# Patient Record
Sex: Female | Born: 1945 | Race: Black or African American | Hispanic: No | Marital: Married | Smoking: Never smoker
Health system: Southern US, Community
[De-identification: ages and names within clinical notes are randomized; demographics above are authoritative.]

## PROBLEM LIST (undated history)

## (undated) DIAGNOSIS — I471 Supraventricular tachycardia, unspecified: Secondary | ICD-10-CM

## (undated) DIAGNOSIS — N184 Chronic kidney disease, stage 4 (severe): Secondary | ICD-10-CM

## (undated) DIAGNOSIS — N183 Chronic kidney disease, stage 3 unspecified: Secondary | ICD-10-CM

## (undated) DIAGNOSIS — R001 Bradycardia, unspecified: Secondary | ICD-10-CM

## (undated) DIAGNOSIS — I071 Rheumatic tricuspid insufficiency: Secondary | ICD-10-CM

## (undated) DIAGNOSIS — I10 Essential (primary) hypertension: Secondary | ICD-10-CM

## (undated) DIAGNOSIS — I160 Hypertensive urgency: Secondary | ICD-10-CM

## (undated) DIAGNOSIS — I480 Paroxysmal atrial fibrillation: Secondary | ICD-10-CM

## (undated) DIAGNOSIS — I493 Ventricular premature depolarization: Secondary | ICD-10-CM

## (undated) DIAGNOSIS — D6852 Prothrombin gene mutation: Secondary | ICD-10-CM

## (undated) DIAGNOSIS — T753XXA Motion sickness, initial encounter: Secondary | ICD-10-CM

## (undated) DIAGNOSIS — I272 Pulmonary hypertension, unspecified: Secondary | ICD-10-CM

## (undated) DIAGNOSIS — D6851 Activated protein C resistance: Secondary | ICD-10-CM

## (undated) DIAGNOSIS — I639 Cerebral infarction, unspecified: Secondary | ICD-10-CM

## (undated) DIAGNOSIS — E785 Hyperlipidemia, unspecified: Secondary | ICD-10-CM

## (undated) HISTORY — DX: Cerebral infarction, unspecified: I63.9

## (undated) HISTORY — DX: Prothrombin gene mutation: D68.52

## (undated) HISTORY — DX: Paroxysmal atrial fibrillation: I48.0

## (undated) HISTORY — DX: Ventricular premature depolarization: I49.3

## (undated) HISTORY — DX: Chronic kidney disease, stage 4 (severe): N18.4

## (undated) HISTORY — DX: Chronic kidney disease, stage 3 unspecified: N18.30

## (undated) HISTORY — DX: Hypertensive urgency: I16.0

## (undated) HISTORY — DX: Bradycardia, unspecified: R00.1

## (undated) HISTORY — DX: Rheumatic tricuspid insufficiency: I07.1

## (undated) HISTORY — DX: Activated protein C resistance: D68.51

## (undated) HISTORY — DX: Pulmonary hypertension, unspecified: I27.20

## (undated) HISTORY — DX: Chronic kidney disease, stage 3 (moderate): N18.3

---

## 1975-04-29 HISTORY — PX: TUBAL LIGATION: SHX77

## 1998-08-21 ENCOUNTER — Other Ambulatory Visit: Admission: RE | Admit: 1998-08-21 | Discharge: 1998-08-21 | Payer: Self-pay | Admitting: *Deleted

## 1998-09-04 ENCOUNTER — Encounter: Payer: Self-pay | Admitting: Obstetrics

## 1998-09-04 ENCOUNTER — Ambulatory Visit (HOSPITAL_COMMUNITY): Admission: RE | Admit: 1998-09-04 | Discharge: 1998-09-04 | Payer: Self-pay | Admitting: Obstetrics

## 2001-12-16 ENCOUNTER — Emergency Department (HOSPITAL_COMMUNITY): Admission: EM | Admit: 2001-12-16 | Discharge: 2001-12-16 | Payer: Self-pay | Admitting: Emergency Medicine

## 2002-10-01 ENCOUNTER — Encounter: Payer: Self-pay | Admitting: Emergency Medicine

## 2002-10-01 ENCOUNTER — Emergency Department (HOSPITAL_COMMUNITY): Admission: EM | Admit: 2002-10-01 | Discharge: 2002-10-01 | Payer: Self-pay | Admitting: Emergency Medicine

## 2004-11-06 ENCOUNTER — Ambulatory Visit (HOSPITAL_COMMUNITY): Admission: RE | Admit: 2004-11-06 | Discharge: 2004-11-06 | Payer: Self-pay | Admitting: Obstetrics & Gynecology

## 2004-11-23 ENCOUNTER — Inpatient Hospital Stay (HOSPITAL_COMMUNITY): Admission: EM | Admit: 2004-11-23 | Discharge: 2004-11-26 | Payer: Self-pay | Admitting: *Deleted

## 2007-04-08 ENCOUNTER — Ambulatory Visit (HOSPITAL_COMMUNITY): Admission: RE | Admit: 2007-04-08 | Discharge: 2007-04-08 | Payer: Self-pay | Admitting: Obstetrics & Gynecology

## 2007-08-20 ENCOUNTER — Emergency Department (HOSPITAL_COMMUNITY): Admission: EM | Admit: 2007-08-20 | Discharge: 2007-08-20 | Payer: Self-pay | Admitting: Emergency Medicine

## 2007-08-20 ENCOUNTER — Ambulatory Visit: Payer: Self-pay | Admitting: Vascular Surgery

## 2007-08-20 ENCOUNTER — Encounter (INDEPENDENT_AMBULATORY_CARE_PROVIDER_SITE_OTHER): Payer: Self-pay | Admitting: Emergency Medicine

## 2009-11-13 ENCOUNTER — Emergency Department (HOSPITAL_COMMUNITY): Admission: EM | Admit: 2009-11-13 | Discharge: 2009-11-13 | Payer: Self-pay | Admitting: Emergency Medicine

## 2010-05-16 ENCOUNTER — Encounter
Admission: RE | Admit: 2010-05-16 | Discharge: 2010-05-16 | Payer: Self-pay | Source: Home / Self Care | Attending: Family Medicine | Admitting: Family Medicine

## 2010-09-13 NOTE — Discharge Summary (Signed)
Jasmine Dawson, Jasmine Dawson              ACCOUNT NO.:  [id number]   MEDICAL RECORD NO.:  [id number]          PATIENT TYPE:  INP   LOCATION:  5524                         FACILITY:  [facility]   PHYSICIAN:  Mina Marble, M.D.DATE OF BIRTH:  [birth year]   DATE OF ADMISSION:  11/23/2004  DATE OF DISCHARGE:  11/26/2004                                 DISCHARGE SUMMARY   DISCHARGE DIAGNOSES:  1.  Vomiting, recurrent, with acute gastritis, viral type.  2.  Acute vertigo with severe dizziness.  3.  Numbness of hands, resolving.  4.  Hypercholesterolism.  5.  Chronic sinusitis.  6.  Hypopotassemia, treated and improved.  7.  Hypertensive heart disease, controlled.  8.  Impaired glucose tolerance.   BRIEF HISTORY:  The patient is a 65 year old African-American female that  presented to the emergency department complaining of severe recurrent  vomiting.  Unable to be controlled in the emergency room even after IV  Valium and was admitted from the [facility] Emergency  Department for further care.  The patient had a history of recurrent skin  rash and urticaria consistent with food allergies which had been controlled  and was improved but had this acute episode of vomiting and vertigo.  The  symptoms were thought to be of a viral etiology.   PAST HISTORY:  Revealed a skin rash, recurrent, that improved with  eliminating foods that she had developed an allergy to especially eggs.   ALLERGIES:  REVIEW OF SYSTEMS REVEALED THE PATIENT TO BE ALLERGIC TO SULFA  DRUGS.   PERTINENT PHYSICAL FINDINGS:  The patient was observed with the head, eyes,  ears, nose and throat to be coherent and alert with the pupils equal, round  and reactive to light.  Neck was supple with no masses.  The breasts  revealed no lumps.  The lungs were clear to auscultation.  The heart had a  regular rhythm and no rubs.  The abdomen was soft with no masses or  organomegaly.  The extremities revealed good  strength with resolving  hyperpigmented areas from rash.   HOSPITAL COURSE:  The patient was given IV fluids with potassium which  gradually corrected her hypokalemia.  The patient's nausea and vomiting was  controlled and hypokalemia corrected, and she was discharged home on November 26, 2004 to restart her Toprol XL for her hypertension at 25 mg daily,  Norvasc 5 mg daily.  The patient was  to follow up in the office on her appointment in August 2006.  The patient  also was to take Lipitor 10 mg tablets one daily for her  hypercholesterolism.  She was continued on a low fat, low cholesterol diet  and no added salt.  She was also to avoid eggs and foods with egg products.      Mina Marble, M.D.  Electronically Signed     GRK/MEDQ  D:  01/16/2005  T:  01/17/2005  Job:  [id number]

## 2011-01-21 LAB — COMPREHENSIVE METABOLIC PANEL
ALT: 14
AST: 27
Alkaline Phosphatase: 109
CO2: 28
Calcium: 9.7
Chloride: 106
GFR calc Af Amer: >60
GFR calc non Af Amer: 58 — ABNORMAL LOW
Glucose, Bld: 133 — ABNORMAL HIGH
Potassium: 4.5
Sodium: 141

## 2011-01-21 LAB — DIFFERENTIAL
Basophils Relative: 0
Eosinophils Absolute: 0.1
Eosinophils Relative: 1
Lymphs Abs: 2.4
Neutrophils Relative %: 58

## 2011-01-21 LAB — CBC
HCT: 39.6
MCHC: 33.2
MCV: 89.1

## 2011-09-10 ENCOUNTER — Encounter (HOSPITAL_COMMUNITY): Payer: Self-pay | Admitting: *Deleted

## 2011-09-10 ENCOUNTER — Observation Stay (HOSPITAL_COMMUNITY)
Admission: EM | Admit: 2011-09-10 | Discharge: 2011-09-11 | Disposition: A | Discharge status: Home / Self Care | Payer: Medicare Other | Attending: Internal Medicine | Admitting: Internal Medicine

## 2011-09-10 ENCOUNTER — Emergency Department (HOSPITAL_COMMUNITY): Payer: Medicare Other

## 2011-09-10 DIAGNOSIS — R42 Dizziness and giddiness: Principal | ICD-10-CM | POA: Diagnosis present

## 2011-09-10 DIAGNOSIS — R109 Unspecified abdominal pain: Secondary | ICD-10-CM | POA: Diagnosis present

## 2011-09-10 DIAGNOSIS — I498 Other specified cardiac arrhythmias: Secondary | ICD-10-CM | POA: Insufficient documentation

## 2011-09-10 DIAGNOSIS — Z79899 Other long term (current) drug therapy: Secondary | ICD-10-CM | POA: Insufficient documentation

## 2011-09-10 DIAGNOSIS — R748 Abnormal levels of other serum enzymes: Secondary | ICD-10-CM

## 2011-09-10 DIAGNOSIS — K8020 Calculus of gallbladder without cholecystitis without obstruction: Secondary | ICD-10-CM | POA: Diagnosis present

## 2011-09-10 DIAGNOSIS — I10 Essential (primary) hypertension: Secondary | ICD-10-CM | POA: Diagnosis present

## 2011-09-10 DIAGNOSIS — R55 Syncope and collapse: Secondary | ICD-10-CM | POA: Insufficient documentation

## 2011-09-10 DIAGNOSIS — E785 Hyperlipidemia, unspecified: Secondary | ICD-10-CM | POA: Insufficient documentation

## 2011-09-10 HISTORY — DX: Essential (primary) hypertension: I10

## 2011-09-10 HISTORY — DX: Hyperlipidemia, unspecified: E78.5

## 2011-09-10 LAB — TROPONIN I: Troponin I: <0.3 ng/mL (ref <0.30)

## 2011-09-10 LAB — CBC
HCT: 36.1 % (ref 36.0–46.0)
Hemoglobin: 12.1 g/dL (ref 12.0–15.0)
MCH: 29.2 pg (ref 26.0–34.0)
MCHC: 33.5 g/dL (ref 30.0–36.0)
RDW: 14 % (ref 11.5–15.5)

## 2011-09-10 LAB — DIFFERENTIAL
Basophils Relative: 0 % (ref 0–1)
Eosinophils Absolute: 0.1 10*3/uL (ref 0.0–0.7)
Lymphs Abs: 2.1 10*3/uL (ref 0.7–4.0)
Monocytes Absolute: 0.5 10*3/uL (ref 0.1–1.0)
Neutro Abs: 5.5 10*3/uL (ref 1.7–7.7)

## 2011-09-10 LAB — GLUCOSE, CAPILLARY: Glucose-Capillary: 134 mg/dL — ABNORMAL HIGH (ref 70–99)

## 2011-09-10 LAB — URINALYSIS, ROUTINE W REFLEX MICROSCOPIC
Bilirubin Urine: NEGATIVE
Nitrite: NEGATIVE
Specific Gravity, Urine: 1.014 (ref 1.005–1.030)
Urobilinogen, UA: 0.2 mg/dL (ref 0.0–1.0)

## 2011-09-10 LAB — COMPREHENSIVE METABOLIC PANEL
Alkaline Phosphatase: 108 U/L (ref 39–117)
BUN: 28 mg/dL — ABNORMAL HIGH (ref 6–23)
Calcium: 8.6 mg/dL (ref 8.4–10.5)
Creatinine, Ser: 0.99 mg/dL (ref 0.50–1.10)
GFR calc Af Amer: 66 mL/min — ABNORMAL LOW (ref >90)
Glucose, Bld: 142 mg/dL — ABNORMAL HIGH (ref 70–99)
Total Protein: 7.8 g/dL (ref 6.0–8.3)

## 2011-09-10 MED ORDER — ONDANSETRON HCL 4 MG/2ML IJ SOLN
INTRAMUSCULAR | Status: AC
  Filled 2011-09-10: qty 2

## 2011-09-10 MED ORDER — MECLIZINE HCL 25 MG PO TABS
25.0000 mg | ORAL_TABLET | Freq: Once | ORAL | Status: AC
  Administered 2011-09-10: 25 mg via ORAL
  Filled 2011-09-10: qty 1

## 2011-09-10 MED ORDER — DIPHENHYDRAMINE HCL 50 MG/ML IJ SOLN
12.5000 mg | Freq: Once | INTRAMUSCULAR | Status: AC
  Administered 2011-09-10: 12.5 mg via INTRAVENOUS
  Filled 2011-09-10: qty 1

## 2011-09-10 MED ORDER — METOCLOPRAMIDE HCL 5 MG/ML IJ SOLN
10.0000 mg | Freq: Once | INTRAMUSCULAR | Status: AC
  Administered 2011-09-10: 10 mg via INTRAVENOUS
  Filled 2011-09-10: qty 2

## 2011-09-10 NOTE — ED Notes (Signed)
Bed:WHALA<BR> Expected date:<BR> Expected time: 7:16 PM<BR> Means of arrival:<BR> Comments:<BR> M130 - 66yoF Nausea, chills, dizzy last day

## 2011-09-10 NOTE — ED Provider Notes (Addendum)
History     CSN: [id number]  Arrival date & time 09/10/11  1308   First MD Initiated Contact with Patient 09/10/11 1942      Chief Complaint  Patient presents with  . Dizziness  . Nausea  . Emesis     HPI The patient presents to the emergency room with complaints of dizziness, nausea and vomiting. Patient states yesterday she started developing some pain in her lower back. She felt a little bit chilled last evening as well. She has noted that she's had some dysuria associated with the symptoms. Today however she started having trouble with nausea, vomiting and dizziness. She was sitting at her table when she suddenly became dizzy. Patient denies focal numbness or weakness. She does feel like the dizziness gets worse with movement and is a little bit better when she is still. She has not had blurred vision or headache. She states she has a history of motion sickness but has never had a spell like this.  Past Medical History  Diagnosis Date  . Hypertension   . Heart palpitations   . Hyperlipidemia     History reviewed. No pertinent past surgical history.  History reviewed. No pertinent family history.  History  Substance Use Topics  . Smoking status: Never Smoker   . Smokeless tobacco: Not on file  . Alcohol Use: No    OB History    Grav Para Term Preterm Abortions TAB SAB Ect Mult Living                  Review of Systems  HENT: Negative for tinnitus.   Respiratory: Negative for shortness of breath.   Cardiovascular: Negative for chest pain.  Neurological: Positive for light-headedness. Negative for tremors, seizures, syncope, speech difficulty, numbness and headaches.  All other systems reviewed and are negative.    Allergies  Prednisone; Sulfa antibiotics; and Eggs or egg-derived products  Home Medications   Current Outpatient Rx  Name Route Sig Dispense Refill  . ASPIRIN 325 MG PO TABS Oral Take 325 mg by mouth daily.    . ATORVASTATIN CALCIUM 20 MG PO  TABS Oral Take 20 mg by mouth daily.    . CHLORPHEN-PE-ACETAMINOPHEN 2-5-325 MG PO TABS Oral Take 2 tablets by mouth daily as needed. sinus    . CLORAZEPATE DIPOTASSIUM 7.5 MG PO TABS Oral Take 7.5 mg by mouth 2 (two) times daily as needed.    [name] OLMESARTAN MEDOXOMIL 20 MG PO TABS Oral Take 20 mg by mouth daily.      BP 138/66  Pulse 74  Temp(Src) 98 F (36.7 C) (Oral)  Resp 20  SpO2 100%  Physical Exam  Nursing note and vitals reviewed. Constitutional: She is oriented to person, place, and time. She appears well-developed and well-nourished. No distress.  HENT:  Head: Normocephalic and atraumatic.  Right Ear: External ear normal.  Left Ear: External ear normal.  Mouth/Throat: Oropharynx is clear and moist.  Eyes: Conjunctivae and EOM are normal. Pupils are equal, round, and reactive to light. Right eye exhibits no discharge. Left eye exhibits no discharge. No scleral icterus.  Neck: Neck supple. No tracheal deviation present.  Cardiovascular: Normal rate, regular rhythm and intact distal pulses.   Pulmonary/Chest: Effort normal and breath sounds normal. No stridor. No respiratory distress. She has no wheezes. She has no rales.  Abdominal: Soft. Bowel sounds are normal. She exhibits no distension. There is no tenderness. There is no rebound and no guarding.  Musculoskeletal: She exhibits no  edema and no tenderness.       Lumbar back: She exhibits decreased range of motion. She exhibits no tenderness, no bony tenderness, no swelling and no edema.  Neurological: She is alert and oriented to person, place, and time. She has normal strength. No cranial nerve deficit ( no gross defecits noted) or sensory deficit. She exhibits normal muscle tone. She displays no seizure activity. Coordination normal.       No pronator drift bilateral upper extrem, able to hold both legs off bed for 5 seconds, sensation intact in all extremities, no visual field cuts, no left or right sided neglect, horizontal  nystagmus  Skin: Skin is warm and dry. No rash noted.  Psychiatric: She has a normal mood and affect.    ED Course  Procedures (including critical care time) EKG Rate 72, nl axis, no acute st t changes Sinus rhythm with occasional Premature ventricular complexes and Premature atrial complexes Otherwise normal ECG Labs Reviewed  COMPREHENSIVE METABOLIC PANEL - Abnormal; Notable for the following:    Sodium 134 (*)    Glucose, Bld 142 (*)    BUN 28 (*)    Total Bilirubin 0.2 (*)    GFR calc non Af Amer 57 (*)    GFR calc Af Amer 66 (*)    All other components within normal limits  URINALYSIS, ROUTINE W REFLEX MICROSCOPIC - Abnormal; Notable for the following:    Leukocytes, UA TRACE (*)    All other components within normal limits  LIPASE, BLOOD - Abnormal; Notable for the following:    Lipase 70 (*)    All other components within normal limits  GLUCOSE, CAPILLARY - Abnormal; Notable for the following:    Glucose-Capillary 134 (*)    All other components within normal limits  URINE MICROSCOPIC-ADD ON - Abnormal; Notable for the following:    Bacteria, UA FEW (*)    All other components within normal limits  PROTIME-INR  APTT  CBC  DIFFERENTIAL  TROPONIN I   Ct Head Wo Contrast  09/10/2011  *RADIOLOGY REPORT*  Clinical Data: Dizziness, nausea and vomiting.  CT HEAD WITHOUT CONTRAST  Technique:  Contiguous axial images were obtained from the base of the skull through the vertex without contrast.  Comparison: None.  Findings: Minimal cerebral atrophy.  Patchy low attenuation changes in the deep white matter consistent with small vessel ischemia. Suggestion of focal cortical low attenuation in the medial left occipital region.  Consider MRI to exclude early changes of acute infarct.  No mass effect or midline shift.  No abnormal extra-axial fluid collections.  Gray-white matter junctions are distinct. Basal cisterns are not effaced.  No evidence of acute intracranial hemorrhage.  No  depressed skull fractures.  Visualized paranasal sinuses and mastoid air cells are not opacified.  Vascular calcifications.  IMPRESSION: Chronic atrophy and small vessel ischemic changes.  Small focal area of low attenuation in the medial left occipital region of nonspecific etiology.  Consider MRI to exclude early change of acute infarct.  No acute hemorrhage.  Original Report Authenticated By: Marlon Pel, M.D.       MDM  The patient presents with complaints of dizziness and nausea. She does describe some slight abdominal pain and her lipase is noted to be mildly elevated at 70. Not certain whether this is a case of pancreatitis considering the mild nature of her symptoms however further evaluation with abdominal ultrasound and serial lipase would be beneficial.  CT scan shows a possibility of a  new stroke in the occipital region.   Pt was able to ambulate however she did still feel somewhat unsteady.  Will consult hospitalist regarding admission for further workup       Celene Kras, MD 09/10/11 1610  Celene Kras, MD 01/07/12 [phone]

## 2011-09-10 NOTE — ED Notes (Signed)
Pt states she was sitting at home at her table when she became dizzy. Pt denies this ever occuring prior to today. Pt states she became nauseated on ems ride to ED. Pt at this time c/o of slight abd pain now but thinks this is related to motion sickness.

## 2011-09-10 NOTE — ED Notes (Signed)
Pt brought in via ems and taken to hall a. ems with iv and 4 mg zofran iv.

## 2011-09-11 ENCOUNTER — Observation Stay (HOSPITAL_COMMUNITY): Payer: Medicare Other

## 2011-09-11 ENCOUNTER — Emergency Department (HOSPITAL_COMMUNITY): Payer: Medicare Other

## 2011-09-11 ENCOUNTER — Encounter (HOSPITAL_COMMUNITY): Payer: Self-pay | Admitting: Family Medicine

## 2011-09-11 DIAGNOSIS — R42 Dizziness and giddiness: Secondary | ICD-10-CM

## 2011-09-11 DIAGNOSIS — I10 Essential (primary) hypertension: Secondary | ICD-10-CM

## 2011-09-11 DIAGNOSIS — E8809 Other disorders of plasma-protein metabolism, not elsewhere classified: Secondary | ICD-10-CM

## 2011-09-11 DIAGNOSIS — K8020 Calculus of gallbladder without cholecystitis without obstruction: Secondary | ICD-10-CM | POA: Diagnosis present

## 2011-09-11 DIAGNOSIS — R109 Unspecified abdominal pain: Secondary | ICD-10-CM | POA: Diagnosis present

## 2011-09-11 LAB — BASIC METABOLIC PANEL
CO2: 25 mEq/L (ref 19–32)
Calcium: 8.8 mg/dL (ref 8.4–10.5)
Creatinine, Ser: 1.07 mg/dL (ref 0.50–1.10)
Glucose, Bld: 112 mg/dL — ABNORMAL HIGH (ref 70–99)

## 2011-09-11 MED ORDER — LISINOPRIL 5 MG PO TABS
5.0000 mg | ORAL_TABLET | Freq: Every day | ORAL | Status: DC
  Filled 2011-09-11: qty 1

## 2011-09-11 MED ORDER — LISINOPRIL 5 MG PO TABS: 5.0000 mg | ORAL_TABLET | Freq: Every day | ORAL | Status: DC

## 2011-09-11 MED ORDER — ASPIRIN 325 MG PO TABS
325.0000 mg | ORAL_TABLET | Freq: Every day | ORAL | Status: DC
  Administered 2011-09-11: 325 mg via ORAL
  Filled 2011-09-11: qty 1

## 2011-09-11 MED ORDER — MECLIZINE HCL 25 MG PO TABS: 25.0000 mg | ORAL_TABLET | Freq: Three times a day (TID) | ORAL | Status: AC | PRN

## 2011-09-11 MED ORDER — SODIUM CHLORIDE 0.9 % IV SOLN
INTRAVENOUS | Status: DC
  Administered 2011-09-11: 02:00:00 via INTRAVENOUS

## 2011-09-11 MED ORDER — SIMVASTATIN 40 MG PO TABS
40.0000 mg | ORAL_TABLET | Freq: Every day | ORAL | Status: DC
  Filled 2011-09-11: qty 1

## 2011-09-11 MED ORDER — SODIUM CHLORIDE 0.9 % IV SOLN
INTRAVENOUS | Status: DC
  Administered 2011-09-11: 14:00:00 via INTRAVENOUS

## 2011-09-11 MED ORDER — MECLIZINE HCL 25 MG PO TABS
25.0000 mg | ORAL_TABLET | Freq: Two times a day (BID) | ORAL | Status: DC
  Administered 2011-09-11 (×2): 25 mg via ORAL
  Filled 2011-09-11 (×3): qty 1

## 2011-09-11 MED ORDER — CLORAZEPATE DIPOTASSIUM 7.5 MG PO TABS: 7.5000 mg | ORAL_TABLET | Freq: Two times a day (BID) | ORAL | Status: DC | PRN

## 2011-09-11 NOTE — H&P (Signed)
PCP:   No primary provider on file.   Chief Complaint:  Dizzyness  66 yr old lady came to the Ed as she started to experience dizzyness that occurred suddenly.   Had been feeling a little nauseaous and thought this was initially sinus issues-had a cold a month ago with cough.  This dizzyness staretd about 5:30 pm   At the time she was sittign in the kitchen chatting with her daughter.  She states that often with taking the benicar she tends to get "lightheaded"-startes takeing the benicar for a while.  This wa sright after she finished eating a sald and felt nauseaous.  Felt like she needed to use th werest room   No position made this worse, but on walking back to her bedroom this made her more dizzy-she lay down and on laying flat felt worse as well .  Closing her eyes made this worse for her as well.   Has had some motion sickness befroe and has had lighteadeness with this but no other issues.  Review of Systems:  The patient denies chest pain, blurred vision, double vision, no headache + n, no vomit until she got over here.  No dark or tarry stool, no dysuria.  No arm pain, no cough or fever  Past Medical History: Past Medical History  Diagnosis Date  . Hypertension   . Heart palpitations   . Hyperlipidemia     Past surgical history: History reviewed. No pertinent past surgical history.  Medications: Prior to Admission medications   Medication Sig Start Date End Date Taking? Authorizing Provider  aspirin 325 MG tablet Take 325 mg by mouth daily.   Yes Historical Provider, MD  atorvastatin (LIPITOR) 20 MG tablet Take 20 mg by mouth daily.   Yes Historical Provider, MD  Chlorphen-Phenyleph-APAP (CORICIDIN D COLD/FLU/SINUS) 2-5-325 MG TABS Take 2 tablets by mouth daily as needed. sinus   Yes Historical Provider, MD  clorazepate (TRANXENE) 7.5 MG tablet Take 7.5 mg by mouth 2 (two) times daily as needed.   Yes Historical Provider, MD  olmesartan (BENICAR) 20 MG tablet Take 20 mg by mouth  daily.   Yes Historical Provider, MD    Allergies:   Allergies  Allergen Reactions  . Prednisone Anaphylaxis, Hives and Itching  . Sulfa Antibiotics Anaphylaxis, Hives and Itching  . Eggs Or Egg-Derived Products     Social History:  reports that she has never smoked. She does not have any smokeless tobacco history on file. She reports that she does not drink alcohol or use illicit drugs.  Family History: History reviewed. No pertinent family history.  Physical Exam: Filed Vitals:   09/10/11 1919 09/10/11 2039  BP: 138/66   Pulse: 74   Temp: 98 F (36.7 C) 98.3 F (36.8 C)  TempSrc: Oral   Resp: 20   SpO2: 100%     HEENT-alert oriented pleasant after emergency room apparent distress CHEST-clinically clear no added sounds CARDS-S1-S2 no murmur or gallop, sinus rhythm on telemetry ABD-soft nontender nondistended no rebound no guarding SKIN-no lower extremity edema NEURO-alert, oriented x3 speech: normal in context and clarity memory: intact grossly cranial nerves II-XII: intact motor strength: full proximally and distally no involuntary movements or tremors cerebellar: finger-to-nose and heel-to-shin intact gait: normal reflexes: full and symmetric plantar responses: downgoing bilaterally   Labs on Admission:   [facility] 09/10/11 2033  NA 134*  K 3.9  CL 100  CO2 22  GLUCOSE 142*  BUN 28*  CREATININE 0.99  CALCIUM 8.6  MG --  PHOS --    Basename 09/10/11 2033  AST 30  ALT 15  ALKPHOS 108  BILITOT 0.2*  PROT 7.8  ALBUMIN 3.8    Basename 09/10/11 2033  LIPASE 70*  AMYLASE --    Basename 09/10/11 2033  WBC 8.2  NEUTROABS 5.5  HGB 12.1  HCT 36.1  MCV 87.2  PLT 266    Basename 09/10/11 2033  CKTOTAL --  CKMB --  CKMBINDEX --  TROPONINI <0.30   No results found for this basename: TSH,T4TOTAL,FREET3,T3FREE,THYROIDAB in the last 72 hours No results found for this basename: VITAMINB12:2,FOLATE:2,FERRITIN:2,TIBC:2,IRON:2,RETICCTPCT:2 in  the last 72 hours  Radiological Exams on Admission: Ct Head Wo Contrast  09/10/2011  *RADIOLOGY REPORT*  Clinical Data: Dizziness, nausea and vomiting.  CT HEAD WITHOUT CONTRAST  Technique:  Contiguous axial images were obtained from the base of the skull through the vertex without contrast.  Comparison: None.  Findings: Minimal cerebral atrophy.  Patchy low attenuation changes in the deep white matter consistent with small vessel ischemia. Suggestion of focal cortical low attenuation in the medial left occipital region.  Consider MRI to exclude early changes of acute infarct.  No mass effect or midline shift.  No abnormal extra-axial fluid collections.  Gray-white matter junctions are distinct. Basal cisterns are not effaced.  No evidence of acute intracranial hemorrhage.  No depressed skull fractures.  Visualized paranasal sinuses and mastoid air cells are not opacified.  Vascular calcifications.  IMPRESSION: Chronic atrophy and small vessel ischemic changes.  Small focal area of low attenuation in the medial left occipital region of nonspecific etiology.  Consider MRI to exclude early change of acute infarct.  No acute hemorrhage.  Original Report Authenticated By: Marlon Pel, M.D.    Assessment/Plan #1-dizziness and presyncopal episodes-her CT scan has been negative and she has no cerebellar findings or any visual disturbances. I'm inclined to think that this is primarily peripheral vertigo sensation and I've counseled the MRI at that admission he requested to be ordered. We will get PT OT to see the patient and determine if she does meet criteria for vestibular training and oral (orthostatic. We will do orthostatic vital signs as well. Certainly she does have multiple reasons for her dizziness but there was does not central related. Her BUN/creatinine ratio is elevated at 28/0.99 I will cautiously hydrate her as well-I. we'll hold off on her Benicar for now and actually got her blood pressures  run higher for the time being. I did perform the maneuver on patient and patient did not have any recurrence of nystagmus or any dizzy sensation and I'm inclined to think about she has multiple reasons for that this is not just vestibular otolith displacement. I will reorder meclizine 25 mg twice a day. We will get vestibular evaluation by therapy  #2 elevated lipase. Repeat lipase in the morning.  #3 hypertension-hold Benicar for now  Patient will be admitted under OBs status Dr reddy, Team #4 Full code >40 min   Zygmund Passero,JAI 09/11/2011, 12:40 AM

## 2011-09-11 NOTE — Discharge Summary (Addendum)
Discharge Summary  Please note patient has another medical record number ([id number]), chart is in the process of being combined.  Jasmine Dawson MR#: [id number]  DOB:31-Dec-1945  Date of Admission: 09/10/2011 Date of Discharge: 09/11/2011  Patient's PCP: Altamese [place], MD, MD  Attending Physician:Zachrey Deutscher A  Consults: None  Discharge Diagnoses: Principal Problem:  *Dizzy spells Active Problems:  HTN (hypertension)  Abdominal pain  Cholelithiasis   Brief Admitting History and Physical 66 yr old lady came to the Ed as she started to experience dizzyness that occurred suddenly on 09/10/2011.  Discharge Medications Medication List  As of 09/11/2011  5:25 PM   STOP taking these medications         olmesartan 20 MG tablet         TAKE these medications         aspirin 325 MG tablet   Take 325 mg by mouth daily.      atorvastatin 20 MG tablet   Commonly known as: LIPITOR   Take 20 mg by mouth daily.      clorazepate 7.5 MG tablet   Commonly known as: TRANXENE   Take 7.5 mg by mouth 2 (two) times daily as needed.      CORICIDIN D COLD/FLU/SINUS 2-5-325 MG Tabs   Generic drug: Chlorphen-Phenyleph-APAP   Take 2 tablets by mouth daily as needed. sinus      lisinopril 5 MG tablet   Commonly known as: PRINIVIL,ZESTRIL   Take 1 tablet (5 mg total) by mouth daily.      meclizine 25 MG tablet   Commonly known as: ANTIVERT   Take 1 tablet (25 mg total) by mouth 3 (three) times daily as needed for dizziness.            Hospital Course: Dizziness and presyncope episodes Etiology unclear.  Patient had head CT on 09/10/2011 which showed chronic atrophy and small vessel ischemic changes, small focal area of low-attenuation in the medial left frontal occipital region of nonspecific etiology, MRI of the brain performed on 09/11/2011 did not show any acute intracranial abnormality.  Dizziness improved during the course of the night.  Patient was evaluated by OT for  vestibular evaluation, since the patient did not have any more dizziness, OT signed off.  No events noted on telemetry.  Troponin was negative not thought to be cardiac in nature.  Orthostatics were checked on admission and was not orthostatic, however patient was gently hydrated during the course of hospital stay.  HTN (hypertension) Instructed the patient to hold Benicar, given the patient attributes her dizziness to taking Benicar.  Patient worried about not taking any antihypertensive medications, started the patient on low dose lisinopril.  Instructed the patient to follow up with her primary care physician in 1 week, further titration of antihypertensive medications to be done as outpatient.  Vague abdominal pain Patient denied having abdominal pain to me, but was noted by ER physician.  Does report intermittent loose bowel movements.  Abdominal ultrasound showed cholelithiasis with probable fatty infiltration of the liver.  Lipase mildly elevated which was repeated that day was normal.  Patient was notified of cholelithiasis and was instructed to followup with primary care physician for further care and management.  Hyperlipidemia Continue statin.  Mild bradycardia Stable. Sinus, HR prior to discharge was 70. Not on any beta-blockers or nodal agents.  Day of Discharge BP 131/72  Pulse 57  Temp(Src) 98.2 F (36.8 C) (Oral)  Resp 18  Ht 5\' 3"  (1.6 m)  Wt 88.814 kg (195 lb 12.8 oz)  BMI 34.68 kg/m2  SpO2 99%  Results for orders placed during the hospital encounter of 09/10/11 (from the past 48 hour(s))  PROTIME-INR     Status: Normal   Collection Time   09/10/11  8:33 PM      Component Value Range Comment   Prothrombin Time 12.7  11.6 - 15.2 (seconds)    INR 0.93  0.00 - 1.49    APTT     Status: Normal   Collection Time   09/10/11  8:33 PM      Component Value Range Comment   aPTT 32  24 - 37 (seconds)   CBC     Status: Normal   Collection Time   09/10/11  8:33 PM       Component Value Range Comment   WBC 8.2  4.0 - 10.5 (K/uL)    RBC 4.14  3.87 - 5.11 (MIL/uL)    Hemoglobin 12.1  12.0 - 15.0 (g/dL)    HCT 16.1  09.6 - 04.5 (%)    MCV 87.2  78.0 - 100.0 (fL)    MCH 29.2  26.0 - 34.0 (pg)    MCHC 33.5  30.0 - 36.0 (g/dL)    RDW 40.9  81.1 - 91.4 (%)    Platelets 266  150 - 400 (K/uL)   DIFFERENTIAL     Status: Normal   Collection Time   09/10/11  8:33 PM      Component Value Range Comment   Neutrophils Relative 68  43 - 77 (%)    Lymphocytes Relative 25  12 - 46 (%)    Monocytes Relative 6  3 - 12 (%)    Eosinophils Relative 1  0 - 5 (%)    Basophils Relative 0  0 - 1 (%)    Neutro Abs 5.5  1.7 - 7.7 (K/uL)    Lymphs Abs 2.1  0.7 - 4.0 (K/uL)    Monocytes Absolute 0.5  0.1 - 1.0 (K/uL)    Eosinophils Absolute 0.1  0.0 - 0.7 (K/uL)    Basophils Absolute 0.0  0.0 - 0.1 (K/uL)    Smear Review MORPHOLOGY UNREMARKABLE     COMPREHENSIVE METABOLIC PANEL     Status: Abnormal   Collection Time   09/10/11  8:33 PM      Component Value Range Comment   Sodium 134 (*) 135 - 145 (mEq/L)    Potassium 3.9  3.5 - 5.1 (mEq/L)    Chloride 100  96 - 112 (mEq/L)    CO2 22  19 - 32 (mEq/L)    Glucose, Bld 142 (*) 70 - 99 (mg/dL)    BUN 28 (*) 6 - 23 (mg/dL)    Creatinine, Ser 7.82  0.50 - 1.10 (mg/dL)    Calcium 8.6  8.4 - 10.5 (mg/dL)    Total Protein 7.8  6.0 - 8.3 (g/dL)    Albumin 3.8  3.5 - 5.2 (g/dL)    AST 30  0 - 37 (U/L) NO VISIBLE HEMOLYSIS   ALT 15  0 - 35 (U/L)    Alkaline Phosphatase 108  39 - 117 (U/L)    Total Bilirubin 0.2 (*) 0.3 - 1.2 (mg/dL)    GFR calc non Af Amer 57 (*) >90 (mL/min)    GFR calc Af Amer 66 (*) >90 (mL/min)   TROPONIN I     Status: Normal   Collection Time   09/10/11  8:33 PM  Component Value Range Comment   Troponin I <0.30  <0.30 (ng/mL)   LIPASE, BLOOD     Status: Abnormal   Collection Time   09/10/11  8:33 PM      Component Value Range Comment   Lipase 70 (*) 11 - 59 (U/L)   GLUCOSE, CAPILLARY     Status:  Abnormal   Collection Time   09/10/11  8:36 PM      Component Value Range Comment   Glucose-Capillary 134 (*) 70 - 99 (mg/dL)    Comment 1 Notify RN     URINALYSIS, ROUTINE W REFLEX MICROSCOPIC     Status: Abnormal   Collection Time   09/10/11  9:41 PM      Component Value Range Comment   Color, Urine YELLOW  YELLOW     APPearance CLEAR  CLEAR     Specific Gravity, Urine 1.014  1.005 - 1.030     pH 7.0  5.0 - 8.0     Glucose, UA NEGATIVE  NEGATIVE (mg/dL)    Hgb urine dipstick NEGATIVE  NEGATIVE     Bilirubin Urine NEGATIVE  NEGATIVE     Ketones, ur NEGATIVE  NEGATIVE (mg/dL)    Protein, ur NEGATIVE  NEGATIVE (mg/dL)    Urobilinogen, UA 0.2  0.0 - 1.0 (mg/dL)    Nitrite NEGATIVE  NEGATIVE     Leukocytes, UA TRACE (*) NEGATIVE    URINE MICROSCOPIC-ADD ON     Status: Abnormal   Collection Time   09/10/11  9:41 PM      Component Value Range Comment   Squamous Epithelial / LPF RARE  RARE     WBC, UA 0-2  <3 (WBC/hpf)    Bacteria, UA FEW (*) RARE    BASIC METABOLIC PANEL     Status: Abnormal   Collection Time   09/11/11  5:05 AM      Component Value Range Comment   Sodium 138  135 - 145 (mEq/L)    Potassium 3.6  3.5 - 5.1 (mEq/L)    Chloride 104  96 - 112 (mEq/L)    CO2 25  19 - 32 (mEq/L)    Glucose, Bld 112 (*) 70 - 99 (mg/dL)    BUN 23  6 - 23 (mg/dL)    Creatinine, Ser 1.61  0.50 - 1.10 (mg/dL)    Calcium 8.8  8.4 - 10.5 (mg/dL)    GFR calc non Af Amer 52 (*) >90 (mL/min)    GFR calc Af Amer 60 (*) >90 (mL/min)   LIPASE, BLOOD     Status: Normal   Collection Time   09/11/11  5:05 AM      Component Value Range Comment   Lipase 56  11 - 59 (U/L)     Ct Head Wo Contrast  09/10/2011  *RADIOLOGY REPORT*  Clinical Data: Dizziness, nausea and vomiting.  CT HEAD WITHOUT CONTRAST  Technique:  Contiguous axial images were obtained from the base of the skull through the vertex without contrast.  Comparison: None.  Findings: Minimal cerebral atrophy.  Patchy low attenuation changes  in the deep white matter consistent with small vessel ischemia. Suggestion of focal cortical low attenuation in the medial left occipital region.  Consider MRI to exclude early changes of acute infarct.  No mass effect or midline shift.  No abnormal extra-axial fluid collections.  Gray-white matter junctions are distinct. Basal cisterns are not effaced.  No evidence of acute intracranial hemorrhage.  No depressed skull fractures.  Visualized  paranasal sinuses and mastoid air cells are not opacified.  Vascular calcifications.  IMPRESSION: Chronic atrophy and small vessel ischemic changes.  Small focal area of low attenuation in the medial left occipital region of nonspecific etiology.  Consider MRI to exclude early change of acute infarct.  No acute hemorrhage.  Original Report Authenticated By: Marlon Pel, M.D.   Mr Brain Wo Contrast  09/11/2011  *RADIOLOGY REPORT*  Clinical Data: 66 year old female with dizziness, weakness, nausea, hypertension.  MRI HEAD WITHOUT CONTRAST  Technique:  Multiplanar, multiecho pulse sequences of the brain and surrounding structures were obtained according to standard protocol without intravenous contrast.  Comparison: Head CT 09/10/2011.  Findings: No restricted diffusion to suggest acute infarction.  No midline shift, mass effect, evidence of mass lesion, ventriculomegaly, extra-axial collection or acute intracranial hemorrhage.  Cervicomedullary junction and pituitary are within normal limits.  Major intracranial vascular flow voids are preserved.  Scattered and patchy cerebral white matter and basal ganglia at T2 and FLAIR hyperintensity.  Comparatively mild involvement of the thalami and brainstem.  Cerebellum is within normal limits. Negative visualized cervical spine.  No cortical encephalomalacia.  Visualized orbit soft tissues are within normal limits.  Negative paranasal sinuses other than a small left maxillary mucous retention cyst.  Negative mastoids and grossly  normal visualized internal auditory structures.  Mildly heterogeneous bone marrow signal, but probably still within normal limits.  Negative scalp soft tissues.  IMPRESSION: 1. No acute intracranial abnormality. 2.  Moderate nonspecific signal changes, favor chronic small vessel disease.  Original Report Authenticated By: Harley Hallmark, M.D.   US Abdomen Complete  09/11/2011  *RADIOLOGY REPORT*  Clinical Data:  Elevated lipase.  COMPLETE ABDOMINAL ULTRASOUND  Comparison:  None  Findings:  Gallbladder:  There are multiple small stones in the dependent portion of the gallbladder.  Mildly contracted gallbladder.  No gallbladder wall thickening or edema.  Murphy's sign was negative.  Common bile duct:  Normal caliber, measuring 4 mm diameter.  Liver:  Mild diffuse increased hepatic echotexture suggesting fatty infiltration.  No focal lesions appreciated.  IVC:  Appears normal.  Pancreas:  Not visualized due to overlying bowel gas.  Spleen:  Spleen length measures 8.3 cm.  Normal homogeneous parenchymal echotexture.  Right Kidney:  Right kidney measures 9 cm length.  No hydronephrosis.  Left Kidney:  Left kidney measures 10.7 cm length.  No hydronephrosis.  Abdominal aorta:  No aneurysm identified.  IMPRESSION: Cholelithiasis.  Probable fatty infiltration of the liver.  Original Report Authenticated By: Marlon Pel, M.D.   Disposition: Home  Diet: Heart healthy diet  Activity: Resume as tolerated   Follow-up Appts: Discharge Orders    Future Orders Please Complete By Expires   Diet - low sodium heart healthy      Increase activity slowly      Discharge instructions      Comments:   Followup with Altamese [place], MD (PCP) in 1 week.      TESTS THAT NEED FOLLOW-UP None  Time spent on discharge, talking to the patient, and coordinating care: 25 mins.  Signed: Cristal Ford, MD 09/11/2011, 5:25 PM

## 2011-09-11 NOTE — ED Notes (Signed)
Pt is being moved to TCU in a few min

## 2011-09-11 NOTE — Progress Notes (Signed)
Subjective: Patient reports that her dizziness has improved.  Objective: Vital signs in last 24 hours: Filed Vitals:   09/11/11 0759 09/11/11 0800 09/11/11 0900 09/11/11 1246  BP: 144/67 140/59 132/63 131/72  Pulse: 84 57 59 57  Temp: 98.3 F (36.8 C) 98.4 F (36.9 C)  98.2 F (36.8 C)  TempSrc:    Oral  Resp: 20 14 16 18   SpO2: 100% 95% 95% 99%   Weight change:   Intake/Output Summary (Last 24 hours) at 09/11/11 1334 Last data filed at 09/11/11 1200  Gross per 24 hour  Intake    180 ml  Output      0 ml  Net    180 ml    Physical Exam: General: Awake, Oriented, No acute distress. HEENT: EOMI. Neck: Supple CV: S1 and S2 Lungs: Clear to ascultation bilaterally Abdomen: Soft, Nontender, Nondistended, +bowel sounds. Ext: Good pulses. Trace edema. Neuro: Cranial nerves grossly intact.  Lab Results:  [facility] 09/11/11 0505 09/10/11 2033  NA 138 134*  K 3.6 3.9  CL 104 100  CO2 25 22  GLUCOSE 112* 142*  BUN 23 28*  CREATININE 1.07 0.99  CALCIUM 8.8 8.6  MG -- --  PHOS -- --    Basename 09/10/11 2033  AST 30  ALT 15  ALKPHOS 108  BILITOT 0.2*  PROT 7.8  ALBUMIN 3.8    Basename 09/11/11 0505 09/10/11 2033  LIPASE 56 70*  AMYLASE -- --    Basename 09/10/11 2033  WBC 8.2  NEUTROABS 5.5  HGB 12.1  HCT 36.1  MCV 87.2  PLT 266    Basename 09/10/11 2033  CKTOTAL --  CKMB --  CKMBINDEX --  TROPONINI <0.30   No components found with this basename: POCBNP:3 No results found for this basename: DDIMER:2 in the last 72 hours No results found for this basename: HGBA1C:2 in the last 72 hours No results found for this basename: CHOL:2,HDL:2,LDLCALC:2,TRIG:2,CHOLHDL:2,LDLDIRECT:2 in the last 72 hours No results found for this basename: TSH,T4TOTAL,FREET3,T3FREE,THYROIDAB in the last 72 hours No results found for this basename: VITAMINB12:2,FOLATE:2,FERRITIN:2,TIBC:2,IRON:2,RETICCTPCT:2 in the last 72 hours  Micro Results: No results found for this or  any previous visit (from the past 240 hour(s)).  Studies/Results: Ct Head Wo Contrast  09/10/2011  *RADIOLOGY REPORT*  Clinical Data: Dizziness, nausea and vomiting.  CT HEAD WITHOUT CONTRAST  Technique:  Contiguous axial images were obtained from the base of the skull through the vertex without contrast.  Comparison: None.  Findings: Minimal cerebral atrophy.  Patchy low attenuation changes in the deep white matter consistent with small vessel ischemia. Suggestion of focal cortical low attenuation in the medial left occipital region.  Consider MRI to exclude early changes of acute infarct.  No mass effect or midline shift.  No abnormal extra-axial fluid collections.  Gray-white matter junctions are distinct. Basal cisterns are not effaced.  No evidence of acute intracranial hemorrhage.  No depressed skull fractures.  Visualized paranasal sinuses and mastoid air cells are not opacified.  Vascular calcifications.  IMPRESSION: Chronic atrophy and small vessel ischemic changes.  Small focal area of low attenuation in the medial left occipital region of nonspecific etiology.  Consider MRI to exclude early change of acute infarct.  No acute hemorrhage.  Original Report Authenticated By: Marlon Pel, M.D.   Mr Brain Wo Contrast  09/11/2011  *RADIOLOGY REPORT*  Clinical Data: 66 year old female with dizziness, weakness, nausea, hypertension.  MRI HEAD WITHOUT CONTRAST  Technique:  Multiplanar, multiecho pulse sequences of the brain  and surrounding structures were obtained according to standard protocol without intravenous contrast.  Comparison: Head CT 09/10/2011.  Findings: No restricted diffusion to suggest acute infarction.  No midline shift, mass effect, evidence of mass lesion, ventriculomegaly, extra-axial collection or acute intracranial hemorrhage.  Cervicomedullary junction and pituitary are within normal limits.  Major intracranial vascular flow voids are preserved.  Scattered and patchy cerebral  white matter and basal ganglia at T2 and FLAIR hyperintensity.  Comparatively mild involvement of the thalami and brainstem.  Cerebellum is within normal limits. Negative visualized cervical spine.  No cortical encephalomalacia.  Visualized orbit soft tissues are within normal limits.  Negative paranasal sinuses other than a small left maxillary mucous retention cyst.  Negative mastoids and grossly normal visualized internal auditory structures.  Mildly heterogeneous bone marrow signal, but probably still within normal limits.  Negative scalp soft tissues.  IMPRESSION: 1. No acute intracranial abnormality. 2.  Moderate nonspecific signal changes, favor chronic small vessel disease.  Original Report Authenticated By: Harley Hallmark, M.D.   US Abdomen Complete  09/11/2011  *RADIOLOGY REPORT*  Clinical Data:  Elevated lipase.  COMPLETE ABDOMINAL ULTRASOUND  Comparison:  None  Findings:  Gallbladder:  There are multiple small stones in the dependent portion of the gallbladder.  Mildly contracted gallbladder.  No gallbladder wall thickening or edema.  Murphy's sign was negative.  Common bile duct:  Normal caliber, measuring 4 mm diameter.  Liver:  Mild diffuse increased hepatic echotexture suggesting fatty infiltration.  No focal lesions appreciated.  IVC:  Appears normal.  Pancreas:  Not visualized due to overlying bowel gas.  Spleen:  Spleen length measures 8.3 cm.  Normal homogeneous parenchymal echotexture.  Right Kidney:  Right kidney measures 9 cm length.  No hydronephrosis.  Left Kidney:  Left kidney measures 10.7 cm length.  No hydronephrosis.  Abdominal aorta:  No aneurysm identified.  IMPRESSION: Cholelithiasis.  Probable fatty infiltration of the liver.  Original Report Authenticated By: Marlon Pel, M.D.    Medications: I have reviewed the patient's current medications. Scheduled Meds:   . aspirin  325 mg Oral Daily  . diphenhydrAMINE  12.5 mg Intravenous Once  . meclizine  25 mg Oral Once    . meclizine  25 mg Oral BID  . metoCLOPramide (REGLAN) injection  10 mg Intravenous Once  . ondansetron      . simvastatin  40 mg Oral Daily   Continuous Infusions:   . sodium chloride    . DISCONTD: sodium chloride 100 mL/hr at 09/11/11 0131   PRN Meds:.clorazepate  Assessment/Plan: Dizziness and presyncope episodes Etiology unclear.  Patient had head CT which showed chronic atrophy and small vessel ischemic changes, small focal area of low-attenuation in the medial left frontal occipital region of nonspecific etiology, MRI of the brain performed on 09/11/2011 did not show any acute intracranial abnormality. Patient was evaluated by OT for vestibular evaluation, since the patient did not have any more dizziness, OT signed off.  No events noted on telemetry.  HTN (hypertension) Instructed the patient to hold Benicar, given the patient attributes her dizziness to taking Benicar.  Patient worried about not taking any antihypertensive medications, start the patient on low dose lisinopril.  Instructed the patient to follow up with her primary care physician in 1 week, further titration of antihypertensive medications to be done as outpatient.  Vague abdominal pain Patient denied having abdominal pain to me that was noted by ER physician.  Does report intermittent loose bowel movements.  Abdominal ultrasound  showed cholelithiasis with probable fatty infiltration of the liver.  Lipase mildly elevated which was repeated that day was normal.  Patient was notified of cholelithiasis and was instructed to followup with primary care physician for further care and management.  Hyperlipidemia Continue statin.  Mild bradycardia Stable.  Prophylaxis SCDs.  Disposition Discharge the patient today.  Discussed MRI findings, CT findings, abdominal ultrasound findings prior to discharge.  Patient was instructed to follow up with primary care physician in 1 week.   LOS: 1 day  Sonali Wivell A,  MD 09/11/2011, 1:34 PM

## 2011-09-11 NOTE — Progress Notes (Signed)
OT Note:  Screened pt for vestibular eval.  She reports that she has been up to bathroom and that she has not had any more dizziness.  If this becomes problematic, please reorder.  Thank you.  [place], OTR/L [phone] 09/11/2011

## 2011-09-11 NOTE — Progress Notes (Signed)
   CARE MANAGEMENT NOTE 09/11/2011  Patient:  Jasmine Dawson,Jasmine Dawson   Account Number:  [id number]  Date Initiated:  09/11/2011  Documentation initiated by:  Jiles Crocker  Subjective/Objective Assessment:   ADMITTED WITH DIZZINESS     Action/Plan:   INDEPENDENT PRIOR TO ADMISSION, LIVES AT HOME WITH SPOUSE   Anticipated DC Date:  09/12/2011   Anticipated DC Plan:  HOME/SELF CARE           Status of service:  In process, will continue to follow Medicare Important Message given?  NA - LOS <3 / Initial given by admissions (If response is "NO", the following Medicare IM given date fields will be blank)  Per UR Regulation:  Reviewed for med. necessity/level of care/duration of stay   Comments:  09/11/2011- B Lyrik Dockstader RN, BSN, MHA

## 2011-09-16 ENCOUNTER — Telehealth: Payer: Self-pay | Admitting: Internal Medicine

## 2011-09-16 NOTE — Telephone Encounter (Signed)
Patient called today because yesterday after she ate salty chicken she noted that her heart was beating fast, she took something for anxiety and felt better.  She wanted to know if the lisinopril which was started in the hospital would cause her to have fast heartbeat.  I instructed the patient that lisinopril does not do that, if she has any questions or concerns she is to come to the emergency department or go to her primary care physician's office for further management.  Patient expressed understanding these instructions.  Arma Reining A, MD 09/16/2011, 1:12 PM

## 2011-09-26 ENCOUNTER — Other Ambulatory Visit: Payer: Self-pay | Admitting: Obstetrics & Gynecology

## 2011-09-26 DIAGNOSIS — N950 Postmenopausal bleeding: Secondary | ICD-10-CM

## 2011-10-03 ENCOUNTER — Ambulatory Visit (HOSPITAL_COMMUNITY)
Admission: RE | Admit: 2011-10-03 | Discharge: 2011-10-03 | Disposition: A | Discharge status: Home / Self Care | Payer: Medicare Other | Source: Ambulatory Visit | Attending: Obstetrics & Gynecology | Admitting: Obstetrics & Gynecology

## 2011-10-03 DIAGNOSIS — R9389 Abnormal findings on diagnostic imaging of other specified body structures: Secondary | ICD-10-CM | POA: Insufficient documentation

## 2011-10-03 DIAGNOSIS — N950 Postmenopausal bleeding: Secondary | ICD-10-CM

## 2011-10-07 ENCOUNTER — Other Ambulatory Visit: Payer: Self-pay | Admitting: Obstetrics & Gynecology

## 2011-10-07 DIAGNOSIS — Z1231 Encounter for screening mammogram for malignant neoplasm of breast: Secondary | ICD-10-CM

## 2011-10-21 ENCOUNTER — Other Ambulatory Visit: Payer: Self-pay | Admitting: Obstetrics & Gynecology

## 2011-10-22 ENCOUNTER — Ambulatory Visit
Admission: RE | Admit: 2011-10-22 | Discharge: 2011-10-22 | Disposition: A | Discharge status: Home / Self Care | Payer: Medicare Other | Source: Ambulatory Visit | Attending: Obstetrics & Gynecology | Admitting: Obstetrics & Gynecology

## 2011-10-22 DIAGNOSIS — Z1231 Encounter for screening mammogram for malignant neoplasm of breast: Secondary | ICD-10-CM

## 2011-10-23 ENCOUNTER — Encounter (HOSPITAL_COMMUNITY): Payer: Self-pay | Admitting: Pharmacy Technician

## 2011-10-31 ENCOUNTER — Encounter (HOSPITAL_COMMUNITY)
Admission: RE | Admit: 2011-10-31 | Discharge: 2011-10-31 | Disposition: A | Discharge status: Home / Self Care | Payer: Medicare Other | Source: Ambulatory Visit | Attending: Obstetrics & Gynecology | Admitting: Obstetrics & Gynecology

## 2011-10-31 ENCOUNTER — Encounter (HOSPITAL_COMMUNITY): Payer: Self-pay

## 2011-10-31 HISTORY — DX: Motion sickness, initial encounter: T75.3XXA

## 2011-10-31 LAB — CBC
MCH: 28.7 pg (ref 26.0–34.0)
MCV: 87.6 fL (ref 78.0–100.0)
Platelets: 281 10*3/uL (ref 150–400)
RDW: 14 % (ref 11.5–15.5)
WBC: 7.9 10*3/uL (ref 4.0–10.5)

## 2011-10-31 LAB — BASIC METABOLIC PANEL
Calcium: 9.8 mg/dL (ref 8.4–10.5)
Chloride: 101 mEq/L (ref 96–112)
Creatinine, Ser: 1.1 mg/dL (ref 0.50–1.10)
GFR calc Af Amer: 58 mL/min — ABNORMAL LOW (ref >90)
Sodium: 139 mEq/L (ref 135–145)

## 2011-10-31 NOTE — Patient Instructions (Addendum)
YOUR PROCEDURE IS SCHEDULED ON:11/07/11  ENTER THROUGH THE MAIN ENTRANCE OF [facility] AT:1pm  USE DESK PHONE AND DIAL [phone] TO INFORM us OF YOUR ARRIVAL  CALL [phone] IF YOU HAVE ANY QUESTIONS OR PROBLEMS PRIOR TO YOUR ARRIVAL.  REMEMBER: DO NOT EAT AFTER MIDNIGHT :Thursday   SPECIAL INSTRUCTIONS:clear liquids ok until 1030 am Friday   YOU MAY BRUSH YOUR TEETH THE MORNING OF SURGERY   TAKE THESE MEDICINES THE DAY OF SURGERY WITH SIP OF WATER:none   DO NOT WEAR JEWELRY, EYE MAKEUP, LIPSTICK OR DARK FINGERNAIL POLISH DO NOT WEAR LOTIONS  DO NOT SHAVE FOR 48 HOURS PRIOR TO SURGERY  YOU WILL NOT BE ALLOWED TO DRIVE YOURSELF HOME.

## 2011-11-07 ENCOUNTER — Encounter (HOSPITAL_COMMUNITY): Payer: Self-pay | Admitting: Anesthesiology

## 2011-11-07 ENCOUNTER — Encounter (HOSPITAL_COMMUNITY)
Admission: RE | Disposition: A | Discharge status: Home / Self Care | Payer: Self-pay | Source: Ambulatory Visit | Attending: Obstetrics & Gynecology

## 2011-11-07 ENCOUNTER — Encounter (HOSPITAL_COMMUNITY): Payer: Self-pay | Admitting: *Deleted

## 2011-11-07 ENCOUNTER — Ambulatory Visit (HOSPITAL_COMMUNITY)
Admission: RE | Admit: 2011-11-07 | Discharge: 2011-11-07 | Disposition: A | Discharge status: Home / Self Care | Payer: Medicare Other | Source: Ambulatory Visit | Attending: Obstetrics & Gynecology | Admitting: Obstetrics & Gynecology

## 2011-11-07 ENCOUNTER — Ambulatory Visit (HOSPITAL_COMMUNITY): Payer: Medicare Other | Admitting: Anesthesiology

## 2011-11-07 DIAGNOSIS — Z01812 Encounter for preprocedural laboratory examination: Secondary | ICD-10-CM | POA: Insufficient documentation

## 2011-11-07 DIAGNOSIS — Z01818 Encounter for other preprocedural examination: Secondary | ICD-10-CM | POA: Insufficient documentation

## 2011-11-07 DIAGNOSIS — N840 Polyp of corpus uteri: Secondary | ICD-10-CM | POA: Diagnosis present

## 2011-11-07 DIAGNOSIS — N950 Postmenopausal bleeding: Secondary | ICD-10-CM | POA: Insufficient documentation

## 2011-11-07 HISTORY — PX: HYSTEROSCOPY WITH D & C: SHX1775

## 2011-11-07 SURGERY — DILATATION AND CURETTAGE /HYSTEROSCOPY
Anesthesia: General

## 2011-11-07 MED ORDER — PROPOFOL 10 MG/ML IV EMUL
INTRAVENOUS | Status: AC
  Filled 2011-11-07: qty 20

## 2011-11-07 MED ORDER — LIDOCAINE HCL (CARDIAC) 20 MG/ML IV SOLN
INTRAVENOUS | Status: AC
  Filled 2011-11-07: qty 5

## 2011-11-07 MED ORDER — FENTANYL CITRATE 0.05 MG/ML IJ SOLN
INTRAMUSCULAR | Status: DC | PRN
  Administered 2011-11-07 (×3): 50 ug via INTRAVENOUS

## 2011-11-07 MED ORDER — FENTANYL CITRATE 0.05 MG/ML IJ SOLN
INTRAMUSCULAR | Status: AC
  Filled 2011-11-07: qty 2

## 2011-11-07 MED ORDER — OXYCODONE-ACETAMINOPHEN 5-325 MG PO TABS: 2.0000 | ORAL_TABLET | Freq: Four times a day (QID) | ORAL | Status: AC | PRN

## 2011-11-07 MED ORDER — ETOMIDATE 2 MG/ML IV SOLN
INTRAVENOUS | Status: AC
  Filled 2011-11-07: qty 10

## 2011-11-07 MED ORDER — ONDANSETRON HCL 4 MG/2ML IJ SOLN
INTRAMUSCULAR | Status: DC | PRN
  Administered 2011-11-07: 4 mg via INTRAVENOUS

## 2011-11-07 MED ORDER — MIDAZOLAM HCL 5 MG/5ML IJ SOLN
INTRAMUSCULAR | Status: DC | PRN
  Administered 2011-11-07 (×2): 1 mg via INTRAVENOUS

## 2011-11-07 MED ORDER — DIPHENHYDRAMINE HCL 50 MG/ML IJ SOLN
INTRAMUSCULAR | Status: AC
  Filled 2011-11-07: qty 1

## 2011-11-07 MED ORDER — SUCCINYLCHOLINE CHLORIDE 20 MG/ML IJ SOLN
INTRAMUSCULAR | Status: AC
  Filled 2011-11-07: qty 10

## 2011-11-07 MED ORDER — ETOMIDATE 2 MG/ML IV SOLN
INTRAVENOUS | Status: DC | PRN
  Administered 2011-11-07: 10 mg via INTRAVENOUS
  Administered 2011-11-07: 160 mg via INTRAVENOUS
  Administered 2011-11-07: 30 mg via INTRAVENOUS

## 2011-11-07 MED ORDER — LACTATED RINGERS IV SOLN
INTRAVENOUS | Status: DC
  Administered 2011-11-07 (×2): via INTRAVENOUS

## 2011-11-07 MED ORDER — LIDOCAINE HCL 1 % IJ SOLN
INTRAMUSCULAR | Status: DC | PRN
  Administered 2011-11-07: 10 mL

## 2011-11-07 MED ORDER — ONDANSETRON HCL 4 MG/2ML IJ SOLN
INTRAMUSCULAR | Status: AC
  Filled 2011-11-07: qty 2

## 2011-11-07 MED ORDER — MIDAZOLAM HCL 2 MG/2ML IJ SOLN
INTRAMUSCULAR | Status: AC
  Filled 2011-11-07: qty 2

## 2011-11-07 MED ORDER — SUCCINYLCHOLINE CHLORIDE 20 MG/ML IJ SOLN
INTRAMUSCULAR | Status: DC | PRN
  Administered 2011-11-07: 100 mg via INTRAVENOUS

## 2011-11-07 SURGICAL SUPPLY — 14 items
CANISTER SUCTION 2500CC (MISCELLANEOUS) ×2 IMPLANT
CATH ROBINSON RED A/P 16FR (CATHETERS) ×2 IMPLANT
CLOTH BEACON ORANGE TIMEOUT ST (SAFETY) ×2 IMPLANT
CONTAINER PREFILL 10% NBF 60ML (FORM) ×4 IMPLANT
ELECT REM PT RETURN 9FT ADLT (ELECTROSURGICAL)
ELECTRODE REM PT RTRN 9FT ADLT (ELECTROSURGICAL) IMPLANT
GLOVE BIO SURGEON STRL SZ 6.5 (GLOVE) ×4 IMPLANT
GOWN PREVENTION PLUS LG XLONG (DISPOSABLE) ×4 IMPLANT
GOWN STRL REIN XL XLG (GOWN DISPOSABLE) ×2 IMPLANT
LOOP ANGLED CUTTING 22FR (CUTTING LOOP) IMPLANT
NEEDLE SPNL 20GX3.5 QUINCKE YW (NEEDLE) IMPLANT
PACK HYSTEROSCOPY LF (CUSTOM PROCEDURE TRAY) ×2 IMPLANT
TOWEL OR 17X24 6PK STRL BLUE (TOWEL DISPOSABLE) ×4 IMPLANT
WATER STERILE IRR 1000ML POUR (IV SOLUTION) ×2 IMPLANT

## 2011-11-07 NOTE — Anesthesia Postprocedure Evaluation (Signed)
  Anesthesia Post-op Note  Patient: Jasmine Dawson  Procedure(s) Performed: Procedure(s) (LRB): DILATATION AND CURETTAGE /HYSTEROSCOPY (N/A)  Patient Location: PACU  Anesthesia Type: General  Level of Consciousness: awake, alert  and oriented  Airway and Oxygen Therapy: Patient Spontanous Breathing  Post-op Pain: none  Post-op Assessment: Post-op Vital signs reviewed, Patient's Cardiovascular Status Stable, Respiratory Function Stable, Patent Airway, No signs of Nausea or vomiting and Pain level controlled  Post-op Vital Signs: Reviewed and stable  Complications: No apparent anesthesia complications

## 2011-11-07 NOTE — Op Note (Signed)
Preoperative diagnosis: post menopausal bleeding and uterine polyp  Postoperative diagnosis: same  Procedure: Diagnostic hysteroscopy, dilatation and curettage, polypectomy  Surgeon: Antionette Char A  Anesthesia: Laryngeal mask airway, paracervical block  Estimated blood loss: 50 ml  Urine output: 100 ml  IV Fluids: per Anesthesiology  Complications: none  Specimen: PATHOLOGY  Operative Findings: 1 cm fundal polyp  Description of procedure:   The patient was taken to the operating room and placed on the operating table in the semi-lithotomy position in [place] stirrups.  Examination under anesthesia was performed.  The patient was prepped and draped in the usual manner.  After a time-out had been completed, a speculum was placed in the vagina.  The anterior lip of the cervix was grasped with a single-toothed tenaculum.    10 cc of 1% lidocaine were injected at 4 and 7 o'clock to produce a paracervical block.  The uterine cavity sounded to 8 cm.  The endocervical canal was dilated with Shawnie Pons dilators.  A 5 mm diagnostic hysteroscope with Glycine as the distending medium was used to perform a diagnostic hysteroscopy.  The findings are noted above.    The hysteroscope was removed. The polyp was grasped with polyp forceps and removed in fragments.   A small, Sims curette was used to perform an endometrial curettage.  The hysteroscope was then reintroduced into the endometrial cavity.  It was felt that the polyp had been completely removed.  All the instruments were removed from the vagina.  Final instrument counts were correct.  The patient was taken to the PACU in stable condition.

## 2011-11-07 NOTE — Transfer of Care (Signed)
Immediate Anesthesia Transfer of Care Note  Patient: Jasmine Dawson  Procedure(s) Performed: Procedure(s) (LRB): DILATATION AND CURETTAGE /HYSTEROSCOPY (N/A)  Patient Location: PACU  Anesthesia Type: General  Level of Consciousness: sedated  Airway & Oxygen Therapy: Patient Spontanous Breathing and Patient connected to nasal cannula oxygen  Post-op Assessment: Report given to PACU RN  Post vital signs: Reviewed and stable  Complications: No apparent anesthesia complications

## 2011-11-07 NOTE — Anesthesia Preprocedure Evaluation (Signed)
Anesthesia Evaluation  Patient identified by MRN, date of birth, ID band Patient awake    Reviewed: Allergy & Precautions, H&P , NPO status , Patient's Chart, lab work & pertinent test results  Airway Mallampati: III TM Distance: >3 FB Neck ROM: Full    Dental No notable dental hx. (+) Teeth Intact   Pulmonary neg pulmonary ROS,  breath sounds clear to auscultation  Pulmonary exam normal       Cardiovascular hypertension, Pt. on medications + dysrhythmias Rhythm:Regular Rate:Normal     Neuro/Psych negative neurological ROS  negative psych ROS   GI/Hepatic negative GI ROS, Neg liver ROS,   Endo/Other  Hyperlipidemia  Renal/GU negative Renal ROS     Musculoskeletal negative musculoskeletal ROS (+)   Abdominal (+) + obese,   Peds  Hematology negative hematology ROS (+)   Anesthesia Other Findings   Reproductive/Obstetrics Endometrial polyp PMB                           Anesthesia Physical Anesthesia Plan  ASA: II  Anesthesia Plan: General   Post-op Pain Management:    Induction: Intravenous  Airway Management Planned: LMA  Additional Equipment:   Intra-op Plan:   Post-operative Plan: Extubation in OR  Informed Consent: I have reviewed the patients History and Physical, chart, labs and discussed the procedure including the risks, benefits and alternatives for the proposed anesthesia with the patient or authorized representative who has indicated his/her understanding and acceptance.   Dental advisory given  Plan Discussed with: CRNA, Anesthesiologist and Surgeon  Anesthesia Plan Comments:         Anesthesia Quick Evaluation

## 2011-11-07 NOTE — H&P (Signed)
  Chief Complaint: 66 y.o.  who presents with postmenopausal bleeding  Details of Present Illness: See above.  Workup suggestive of an endometrial polyp.  BP 142/55  Pulse 64  Temp 98.8 F (37.1 C) (Oral)  Resp 16  Ht 5\' 3"  (1.6 m)  Wt 88.905 kg (196 lb)  BMI 34.72 kg/m2  SpO2 99%  Past Medical History  Diagnosis Date  . Hypertension   . Heart palpitations   . Hyperlipidemia   . Motion sickness   . Dysrhythmia     occasional PVCs   History   Social History  . Marital Status: Married    Spouse Name: N/A    Number of Children: N/A  . Years of Education: N/A   Occupational History  . Not on file.   Social History Main Topics  . Smoking status: Never Smoker   . Smokeless tobacco: Never Used  . Alcohol Use: No  . Drug Use: No  . Sexually Active: Not Currently   Other Topics Concern  . Not on file   Social History Narrative   Did daycare as a younger lady.  Retired at [employer] [phone]    Family History  Problem Relation Age of Onset  . Diabetes Mother     died at 15  . Heart attack Mother   . Heart attack Father     died at 23    Pertinent items are noted in HPI.  Pre-Op Diagnosis: Post menopausal bleeding/ Endometrial polyp   Planned Procedure: Procedure(s): DILATATION AND CURETTAGE /HYSTEROSCOPY  I have reviewed the patient's history and have completed the physical exam and Jasmine Dawson is acceptable for surgery.  Roseanna Rainbow, MD 11/07/2011 1:47 PM

## 2011-11-10 ENCOUNTER — Encounter (HOSPITAL_COMMUNITY): Payer: Self-pay | Admitting: Obstetrics & Gynecology

## 2015-05-16 ENCOUNTER — Emergency Department (INDEPENDENT_AMBULATORY_CARE_PROVIDER_SITE_OTHER)
Admission: EM | Admit: 2015-05-16 | Discharge: 2015-05-16 | Disposition: A | Discharge status: Nursing Home (Includes ICF) | Payer: Medicare Other | Source: Home / Self Care | Attending: Family Medicine | Admitting: Family Medicine

## 2015-05-16 ENCOUNTER — Encounter (HOSPITAL_COMMUNITY): Payer: Self-pay

## 2015-05-16 ENCOUNTER — Encounter (HOSPITAL_COMMUNITY): Payer: Self-pay | Admitting: *Deleted

## 2015-05-16 DIAGNOSIS — M79644 Pain in right finger(s): Secondary | ICD-10-CM | POA: Diagnosis not present

## 2015-05-16 DIAGNOSIS — Z9851 Tubal ligation status: Secondary | ICD-10-CM | POA: Insufficient documentation

## 2015-05-16 DIAGNOSIS — E785 Hyperlipidemia, unspecified: Secondary | ICD-10-CM | POA: Diagnosis not present

## 2015-05-16 DIAGNOSIS — Z7982 Long term (current) use of aspirin: Secondary | ICD-10-CM | POA: Diagnosis not present

## 2015-05-16 DIAGNOSIS — R1011 Right upper quadrant pain: Secondary | ICD-10-CM | POA: Insufficient documentation

## 2015-05-16 DIAGNOSIS — Z79899 Other long term (current) drug therapy: Secondary | ICD-10-CM | POA: Insufficient documentation

## 2015-05-16 DIAGNOSIS — R110 Nausea: Secondary | ICD-10-CM | POA: Diagnosis not present

## 2015-05-16 DIAGNOSIS — I10 Essential (primary) hypertension: Secondary | ICD-10-CM | POA: Diagnosis not present

## 2015-05-16 DIAGNOSIS — R1010 Upper abdominal pain, unspecified: Secondary | ICD-10-CM | POA: Diagnosis present

## 2015-05-16 DIAGNOSIS — R1013 Epigastric pain: Secondary | ICD-10-CM | POA: Insufficient documentation

## 2015-05-16 LAB — CBC
HEMATOCRIT: 37.2 % (ref 36.0–46.0)
HEMOGLOBIN: 12.5 g/dL (ref 12.0–15.0)
MCH: 29.8 pg (ref 26.0–34.0)
MCHC: 33.6 g/dL (ref 30.0–36.0)
MCV: 88.8 fL (ref 78.0–100.0)
Platelets: 306 10*3/uL (ref 150–400)
RBC: 4.19 MIL/uL (ref 3.87–5.11)
RDW: 14.1 % (ref 11.5–15.5)
WBC: 10.3 10*3/uL (ref 4.0–10.5)

## 2015-05-16 LAB — COMPREHENSIVE METABOLIC PANEL
ALBUMIN: 4.1 g/dL (ref 3.5–5.0)
ALK PHOS: 94 U/L (ref 38–126)
ALT: 17 U/L (ref 14–54)
ANION GAP: 13 (ref 5–15)
AST: 38 U/L (ref 15–41)
BILIRUBIN TOTAL: 0.5 mg/dL (ref 0.3–1.2)
BUN: 21 mg/dL — AB (ref 6–20)
CALCIUM: 9.7 mg/dL (ref 8.9–10.3)
CO2: 25 mmol/L (ref 22–32)
Chloride: 101 mmol/L (ref 101–111)
Creatinine, Ser: 1.38 mg/dL — ABNORMAL HIGH (ref 0.44–1.00)
GFR calc Af Amer: 43 mL/min — ABNORMAL LOW (ref >60)
GFR calc non Af Amer: 37 mL/min — ABNORMAL LOW (ref >60)
GLUCOSE: 113 mg/dL — AB (ref 65–99)
Potassium: 4 mmol/L (ref 3.5–5.1)
SODIUM: 139 mmol/L (ref 135–145)
TOTAL PROTEIN: 7.9 g/dL (ref 6.5–8.1)

## 2015-05-16 LAB — URINALYSIS, ROUTINE W REFLEX MICROSCOPIC
Bilirubin Urine: NEGATIVE
GLUCOSE, UA: NEGATIVE mg/dL
HGB URINE DIPSTICK: NEGATIVE
Ketones, ur: NEGATIVE mg/dL
LEUKOCYTES UA: NEGATIVE
Nitrite: NEGATIVE
PROTEIN: NEGATIVE mg/dL
SPECIFIC GRAVITY, URINE: 1.009 (ref 1.005–1.030)
pH: 7.5 (ref 5.0–8.0)

## 2015-05-16 LAB — LIPASE, BLOOD: Lipase: 44 U/L (ref 11–51)

## 2015-05-16 NOTE — ED Provider Notes (Signed)
CSN: [id number]     Arrival date & time 05/16/15  1907 History   First MD Initiated Contact with Patient 05/16/15 1935     Chief Complaint  Patient presents with  . Abdominal Pain   (Consider location/radiation/quality/duration/timing/severity/associated sxs/prior Treatment) Patient is a 70 y.o. female presenting with abdominal pain.  Abdominal Pain Pain location:  RUQ Pain quality: cramping   Pain radiates to:  R flank Pain severity:  Moderate Onset quality:  Sudden Duration:  24 hours Progression:  Worsening Chronicity:  New Context comment:  Pt thought it was gas and self treated with tums and maalox without relief. Associated symptoms: anorexia, belching, chills, fever and nausea   Associated symptoms: no constipation and no diarrhea     Past Medical History  Diagnosis Date  . Hypertension   . Heart palpitations   . Hyperlipidemia   . Motion sickness   . Dysrhythmia     occasional PVCs   Past Surgical History  Procedure Laterality Date  . Tubal ligation  1977  . Hysteroscopy w/d&c  11/07/2011    Procedure: DILATATION AND CURETTAGE /HYSTEROSCOPY;  Surgeon: Antionette Char, MD;  Location: [facility];  Service: Gynecology;  Laterality: N/A;  endometrial polypectomy   Family History  Problem Relation Age of Onset  . Diabetes Mother     died at 41  . Heart attack Mother   . Heart attack Father     died at 77   Social History  Substance Use Topics  . Smoking status: Never Smoker   . Smokeless tobacco: Never Used  . Alcohol Use: No   OB History    No data available     Review of Systems  Constitutional: Positive for fever, chills and appetite change.  HENT: Negative.   Respiratory: Negative.   Cardiovascular: Negative.   Gastrointestinal: Positive for nausea, abdominal pain and anorexia. Negative for diarrhea and constipation.  Genitourinary: Negative.   All other systems reviewed and are negative.   Allergies  Prednisone; Sulfa antibiotics; Caffeine;  Eggs or egg-derived products; Influenza vaccines; and Shellfish allergy  Home Medications   Prior to Admission medications   Medication Sig Start Date End Date Taking? Authorizing Provider  aspirin 325 MG tablet Take 325 mg by mouth at bedtime.     Historical Provider, MD  atorvastatin (LIPITOR) 20 MG tablet Take 20 mg by mouth daily.    Historical Provider, MD  clorazepate (TRANXENE) 7.5 MG tablet Take 7.5 mg by mouth 2 (two) times daily as needed. For nervousness or motion sickness    Historical Provider, MD  lisinopril-hydrochlorothiazide (PRINZIDE,ZESTORETIC) 20-25 MG per tablet Take 1 tablet by mouth daily.    Historical Provider, MD  meclizine (ANTIVERT) 25 MG tablet Take 25 mg by mouth 3 (three) times daily as needed. For vertigo/motion sickness    Historical Provider, MD   Meds Ordered and Administered this Visit  Medications - No data to display  BP 168/83 mmHg  Pulse 75  Temp(Src) 99.2 F (37.3 C) (Oral)  SpO2 98% No data found.   Physical Exam  Constitutional: She is oriented to person, place, and time. She appears well-developed and well-nourished. No distress.  HENT:  Right Ear: External ear normal.  Left Ear: External ear normal.  Mouth/Throat: Oropharynx is clear and moist.  Neck: Normal range of motion. Neck supple.  Cardiovascular: Normal heart sounds.   Pulmonary/Chest: Effort normal and breath sounds normal.  Abdominal: Soft. Normal appearance. She exhibits no distension and no mass. Bowel sounds are decreased.  There is no hepatosplenomegaly. There is tenderness in the right upper quadrant. There is positive Murphy's sign. There is no rigidity, no rebound, no guarding and no CVA tenderness.  Neurological: She is alert and oriented to person, place, and time.  Skin: Skin is warm and dry.  Nursing note and vitals reviewed.   ED Course  Procedures (including critical care time)  Labs Review Labs Reviewed - No data to display  Imaging Review No results  found.   Visual Acuity Review  Right Eye Distance:   Left Eye Distance:   Bilateral Distance:    Right Eye Near:   Left Eye Near:    Bilateral Near:         MDM   1. Right upper quadrant pain    Sent for eval of prob acute chole, onset yest, getting worse.    Linna Hoff, MD 05/16/15 [phone]

## 2015-05-16 NOTE — ED Notes (Signed)
Pt    Reports     Pain  r    Side    With some  Nausea   And  Indigestion       With   Symptoms  Of  Several  Days         pt  Reports  Symptoms  Worse    After    Eating   denys  Any  Injury  No  Vomiting

## 2015-05-16 NOTE — ED Notes (Signed)
Pt here with c/o RUQ abdominal pain with nausea, onset yesterday and worse after eating. She went to [facility] today and was sent here for a scan of her gallbladder.

## 2015-05-17 ENCOUNTER — Emergency Department (HOSPITAL_COMMUNITY): Payer: Medicare Other

## 2015-05-17 ENCOUNTER — Emergency Department (HOSPITAL_COMMUNITY)
Admission: EM | Admit: 2015-05-17 | Discharge: 2015-05-17 | Disposition: A | Discharge status: Home / Self Care | Payer: Medicare Other | Attending: Emergency Medicine | Admitting: Emergency Medicine

## 2015-05-17 DIAGNOSIS — R1010 Upper abdominal pain, unspecified: Secondary | ICD-10-CM

## 2015-05-17 DIAGNOSIS — R1011 Right upper quadrant pain: Secondary | ICD-10-CM | POA: Diagnosis not present

## 2015-05-17 MED ORDER — TRAMADOL HCL 50 MG PO TABS: 50.0000 mg | ORAL_TABLET | Freq: Four times a day (QID) | ORAL | Status: DC | PRN

## 2015-05-17 MED ORDER — RANITIDINE HCL 150 MG PO TABS: 150.0000 mg | ORAL_TABLET | Freq: Two times a day (BID) | ORAL | Status: DC

## 2015-05-17 NOTE — ED Provider Notes (Signed)
CSN: [id number]     Arrival date & time 05/16/15  2009 History  By signing my name below, I, Marisue Humble, attest that this documentation has been prepared under the direction and in the presence of Gilda Crease, MD . Electronically Signed: Marisue Humble, Scribe. 05/17/2015. 1:59 AM.  Chief Complaint  Patient presents with  . Abdominal Pain   The history is provided by the patient. No language interpreter was used.   HPI Comments:  Jasmine Dawson is a 70 y.o. female who presents to the Emergency Department complaining of sudden onset, waxing and waning abdomen pain onset 2 days ago. She notes pain to upper abdomen and states her pain at this time is mild. Pt states the pain is worse with food. Pt also reports associated mild nausea. She denies vomiting, diarrhea, constipation, hematemesis and hematochezia. She reports she has not had any abdominal surgeries. She was sent to the ED from urgent care to have [place] of gallbladder.   Pt also c/o moderate pain in her right thumb for the past two weeks. She reports the pain is worse when trying to move the joint, but she can move the finger. She is unsure of injury.  Past Medical History  Diagnosis Date  . Hypertension   . Heart palpitations   . Hyperlipidemia   . Motion sickness   . Dysrhythmia     occasional PVCs   Past Surgical History  Procedure Laterality Date  . Tubal ligation  1977  . Hysteroscopy w/d&c  11/07/2011    Procedure: DILATATION AND CURETTAGE /HYSTEROSCOPY;  Surgeon: Antionette Char, MD;  Location: [facility];  Service: Gynecology;  Laterality: N/A;  endometrial polypectomy   Family History  Problem Relation Age of Onset  . Diabetes Mother     died at 71  . Heart attack Mother   . Heart attack Father     died at 34   Social History  Substance Use Topics  . Smoking status: Never Smoker   . Smokeless tobacco: Never Used  . Alcohol Use: No   OB History    No data available     Review of Systems   Gastrointestinal: Positive for nausea and abdominal pain. Negative for vomiting, diarrhea, constipation and blood in stool.  Musculoskeletal: Positive for arthralgias (Right Thumb).  All other systems reviewed and are negative.  Allergies  Prednisone; Sulfa antibiotics; Caffeine; Eggs or egg-derived products; Influenza vaccines; and Shellfish allergy  Home Medications   Prior to Admission medications   Medication Sig Start Date End Date Taking? Authorizing Provider  aspirin 325 MG tablet Take 325 mg by mouth at bedtime.     Historical Provider, MD  atorvastatin (LIPITOR) 20 MG tablet Take 20 mg by mouth daily.    Historical Provider, MD  clorazepate (TRANXENE) 7.5 MG tablet Take 7.5 mg by mouth 2 (two) times daily as needed. For nervousness or motion sickness    Historical Provider, MD  lisinopril-hydrochlorothiazide (PRINZIDE,ZESTORETIC) 20-25 MG per tablet Take 1 tablet by mouth daily.    Historical Provider, MD  meclizine (ANTIVERT) 25 MG tablet Take 25 mg by mouth 3 (three) times daily as needed. For vertigo/motion sickness    Historical Provider, MD   BP 131/68 mmHg  Pulse 70  Temp(Src) 98.4 F (36.9 C) (Oral)  Resp 17  SpO2 100% Physical Exam  Constitutional: She is oriented to person, place, and time. She appears well-developed and well-nourished. No distress.  HENT:  Head: Normocephalic and atraumatic.  Right Ear: Hearing  normal.  Left Ear: Hearing normal.  Nose: Nose normal.  Mouth/Throat: Oropharynx is clear and moist and mucous membranes are normal.  Eyes: Conjunctivae and EOM are normal. Pupils are equal, round, and reactive to light.  Neck: Normal range of motion. Neck supple.  Cardiovascular: Regular rhythm, S1 normal and S2 normal.  Exam reveals no gallop and no friction rub.   No murmur heard. Pulmonary/Chest: Effort normal and breath sounds normal. No respiratory distress. She exhibits no tenderness.  Abdominal: Soft. Normal appearance and bowel sounds are  normal. There is no hepatosplenomegaly. There is tenderness (Epigastric and RUQ). There is no rebound, no guarding, no tenderness at McBurney's point and negative Murphy's sign. No hernia.  Musculoskeletal:  Tenderness and decreased movement of IPJ of right thumb No redness/bruising/ swelling of the digit.   Neurological: She is alert and oriented to person, place, and time. She has normal strength. No cranial nerve deficit or sensory deficit. Coordination normal. GCS eye subscore is 4. GCS verbal subscore is 5. GCS motor subscore is 6.  Skin: Skin is warm, dry and intact. No rash noted. No cyanosis.  Psychiatric: She has a normal mood and affect. Her speech is normal and behavior is normal. Thought content normal.  Nursing note and vitals reviewed.   ED Course  Procedures  DIAGNOSTIC STUDIES:  Oxygen Saturation is 100% on room air, normal by my interpretation.    COORDINATION OF CARE:  1:24 AM Ordered abdominal US and X-ray of right thumb. Discussed treatment plan with pt at bedside and pt agreed to plan.  Labs Review Labs Reviewed  COMPREHENSIVE METABOLIC PANEL - Abnormal; Notable for the following:    Glucose, Bld 113 (*)    BUN 21 (*)    Creatinine, Ser 1.38 (*)    GFR calc non Af Amer 37 (*)    GFR calc Af Amer 43 (*)    All other components within normal limits  LIPASE, BLOOD  CBC  URINALYSIS, ROUTINE W REFLEX MICROSCOPIC (NOT AT [facility])    Imaging Review Dg Finger Thumb Right  05/17/2015  CLINICAL DATA:  Acute onset of right thumb pain and bruising. Initial encounter. EXAM: RIGHT THUMB 2+V COMPARISON:  None. FINDINGS: Mild degenerative osteophyte formation is noted about the first interphalangeal joint. There is no evidence of fracture or dislocation. No soft tissue injury is not well characterized on radiograph. Visualized joint spaces are otherwise unremarkable. IMPRESSION: No evidence of fracture or dislocation. Electronically Signed   By: Roanna Raider M.D.   On:  05/17/2015 02:09   I have personally reviewed and evaluated these images and lab results as part of my medical decision-making.   EKG Interpretation None     MDM   Final diagnoses:  None   abdominal pain  Transferred from urgent care for further evaluation of abdominal pain. Patient having intermittent episodes of pain in the upper abdomen, predominantly right upper abdomen with radiation into the back. Lab work was unremarkable. Ultrasound performed, no evidence of acute gallbladder disease. Will treat for reflux, follow-up with primary doctor.  I personally performed the services described in this documentation, which was scribed in my presence. The recorded information has been reviewed and is accurate.    Gilda Crease, MD 05/17/15 [phone]

## 2015-05-17 NOTE — Discharge Instructions (Signed)

## 2015-05-18 ENCOUNTER — Emergency Department (HOSPITAL_COMMUNITY): Payer: Medicare Other

## 2015-05-18 ENCOUNTER — Encounter (HOSPITAL_COMMUNITY): Payer: Self-pay | Admitting: *Deleted

## 2015-05-18 ENCOUNTER — Emergency Department (HOSPITAL_COMMUNITY)
Admission: EM | Admit: 2015-05-18 | Discharge: 2015-05-19 | Disposition: A | Discharge status: Home / Self Care | Payer: Medicare Other | Attending: Emergency Medicine | Admitting: Emergency Medicine

## 2015-05-18 DIAGNOSIS — R1011 Right upper quadrant pain: Secondary | ICD-10-CM | POA: Diagnosis not present

## 2015-05-18 DIAGNOSIS — R112 Nausea with vomiting, unspecified: Secondary | ICD-10-CM | POA: Diagnosis not present

## 2015-05-18 DIAGNOSIS — R1013 Epigastric pain: Secondary | ICD-10-CM | POA: Diagnosis not present

## 2015-05-18 DIAGNOSIS — E785 Hyperlipidemia, unspecified: Secondary | ICD-10-CM | POA: Insufficient documentation

## 2015-05-18 DIAGNOSIS — Z79899 Other long term (current) drug therapy: Secondary | ICD-10-CM | POA: Diagnosis not present

## 2015-05-18 DIAGNOSIS — R109 Unspecified abdominal pain: Secondary | ICD-10-CM

## 2015-05-18 DIAGNOSIS — I10 Essential (primary) hypertension: Secondary | ICD-10-CM | POA: Insufficient documentation

## 2015-05-18 LAB — COMPREHENSIVE METABOLIC PANEL
ALK PHOS: 95 U/L (ref 38–126)
ALT: 20 U/L (ref 14–54)
AST: 45 U/L — AB (ref 15–41)
Albumin: 3.9 g/dL (ref 3.5–5.0)
Anion gap: 11 (ref 5–15)
BILIRUBIN TOTAL: 1.3 mg/dL — AB (ref 0.3–1.2)
BUN: 25 mg/dL — ABNORMAL HIGH (ref 6–20)
CHLORIDE: 104 mmol/L (ref 101–111)
CO2: 24 mmol/L (ref 22–32)
Calcium: 9.5 mg/dL (ref 8.9–10.3)
Creatinine, Ser: 1.42 mg/dL — ABNORMAL HIGH (ref 0.44–1.00)
GFR, EST AFRICAN AMERICAN: 42 mL/min — AB (ref >60)
GFR, EST NON AFRICAN AMERICAN: 36 mL/min — AB (ref >60)
Glucose, Bld: 139 mg/dL — ABNORMAL HIGH (ref 65–99)
POTASSIUM: 5 mmol/L (ref 3.5–5.1)
Sodium: 139 mmol/L (ref 135–145)
Total Protein: 7.9 g/dL (ref 6.5–8.1)

## 2015-05-18 LAB — CBC WITH DIFFERENTIAL/PLATELET
BASOS ABS: 0 10*3/uL (ref 0.0–0.1)
Basophils Relative: 0 %
EOS PCT: 1 %
Eosinophils Absolute: 0.1 10*3/uL (ref 0.0–0.7)
HEMATOCRIT: 35.5 % — AB (ref 36.0–46.0)
Hemoglobin: 12 g/dL (ref 12.0–15.0)
LYMPHS ABS: 1.8 10*3/uL (ref 0.7–4.0)
LYMPHS PCT: 23 %
MCH: 29.3 pg (ref 26.0–34.0)
MCHC: 33.8 g/dL (ref 30.0–36.0)
MCV: 86.6 fL (ref 78.0–100.0)
MONO ABS: 0.4 10*3/uL (ref 0.1–1.0)
Monocytes Relative: 5 %
NEUTROS ABS: 5.6 10*3/uL (ref 1.7–7.7)
Neutrophils Relative %: 71 %
PLATELETS: 252 10*3/uL (ref 150–400)
RBC: 4.1 MIL/uL (ref 3.87–5.11)
RDW: 13.9 % (ref 11.5–15.5)
WBC: 7.9 10*3/uL (ref 4.0–10.5)

## 2015-05-18 LAB — TROPONIN I

## 2015-05-18 LAB — LIPASE, BLOOD: LIPASE: 40 U/L (ref 11–51)

## 2015-05-18 MED ORDER — IOHEXOL 300 MG/ML  SOLN
100.0000 mL | Freq: Once | INTRAMUSCULAR | Status: AC | PRN
  Administered 2015-05-18: 80 mL via INTRAVENOUS

## 2015-05-18 MED ORDER — SODIUM CHLORIDE 0.9 % IV SOLN
INTRAVENOUS | Status: DC
  Administered 2015-05-18: 22:00:00 via INTRAVENOUS

## 2015-05-18 MED ORDER — ONDANSETRON HCL 4 MG/2ML IJ SOLN
4.0000 mg | Freq: Once | INTRAMUSCULAR | Status: AC
  Administered 2015-05-18: 4 mg via INTRAVENOUS
  Filled 2015-05-18: qty 2

## 2015-05-18 MED ORDER — SODIUM CHLORIDE 0.9 % IV BOLUS (SEPSIS)
500.0000 mL | Freq: Once | INTRAVENOUS | Status: AC
  Administered 2015-05-18: 500 mL via INTRAVENOUS

## 2015-05-18 MED ORDER — FAMOTIDINE IN NACL 20-0.9 MG/50ML-% IV SOLN
20.0000 mg | INTRAVENOUS | Status: AC
  Administered 2015-05-18: 20 mg via INTRAVENOUS
  Filled 2015-05-18: qty 50

## 2015-05-18 MED ORDER — IOHEXOL 300 MG/ML  SOLN
25.0000 mL | Freq: Once | INTRAMUSCULAR | Status: AC | PRN
  Administered 2015-05-18: 25 mL via ORAL

## 2015-05-18 MED ORDER — MORPHINE SULFATE (PF) 4 MG/ML IV SOLN
4.0000 mg | Freq: Once | INTRAVENOUS | Status: AC
  Administered 2015-05-18: 4 mg via INTRAVENOUS
  Filled 2015-05-18: qty 1

## 2015-05-18 NOTE — ED Provider Notes (Signed)
CSN: [id number]     Arrival date & time 05/18/15  1839 History   First MD Initiated Contact with Patient 05/18/15 1856     Chief Complaint  Patient presents with  . Abdominal Pain     (Consider location/radiation/quality/duration/timing/severity/associated sxs/prior Treatment) Patient is a 70 y.o. female presenting with abdominal pain. The history is provided by the patient and medical records.  Abdominal Pain Associated symptoms: nausea and vomiting     70 year old female with history of hypertension, hyperlipidemia, motion sickness, presenting to the ED for abdominal pain. She was seen in the ED 2 days ago for the same. She was initially evaluated at urgent care and was sent to the ED due to concern for bladder disease with her right upper quadrant pain. Patient had negative evaluation the emergency department, was discharged home with treatment for acid reflux. She states since this time pain has persisted, no improvement with the medications. She states that Zantac and tramadol made her vomit. She has had persistent nausea throughout the day today but is only had one episode of nonbloody, nonbilious emesis. She did have a bowel movement today which was normal. Patient's only abdominal surgery is tubal ligation. Patient does admit that several days prior to the onset of abdominal pain she had been eating large amounts of pickled beets. She states she generally does not eat spicy foods on a regular basis. She denies any chest pain or shortness of breath.  VSS.  Past Medical History  Diagnosis Date  . Hypertension   . Heart palpitations   . Hyperlipidemia   . Motion sickness   . Dysrhythmia     occasional PVCs   Past Surgical History  Procedure Laterality Date  . Tubal ligation  1977  . Hysteroscopy w/d&c  11/07/2011    Procedure: DILATATION AND CURETTAGE /HYSTEROSCOPY;  Surgeon: Antionette Char, MD;  Location: [facility];  Service: Gynecology;  Laterality: N/A;  endometrial polypectomy    Family History  Problem Relation Age of Onset  . Diabetes Mother     died at 40  . Heart attack Mother   . Heart attack Father     died at 23   Social History  Substance Use Topics  . Smoking status: Never Smoker   . Smokeless tobacco: Never Used  . Alcohol Use: No   OB History    No data available     Review of Systems  Gastrointestinal: Positive for nausea, vomiting and abdominal pain.  All other systems reviewed and are negative.     Allergies  Prednisone; Sulfa antibiotics; Caffeine; Eggs or egg-derived products; Influenza vaccines; and Shellfish allergy  Home Medications   Prior to Admission medications   Medication Sig Start Date End Date Taking? Authorizing Provider  atorvastatin (LIPITOR) 40 MG tablet Take 40 mg by mouth at bedtime.  04/17/15  Yes Historical Provider, MD  lisinopril-hydrochlorothiazide (PRINZIDE,ZESTORETIC) 20-25 MG per tablet Take 1 tablet by mouth daily.   Yes Historical Provider, MD  ranitidine (ZANTAC) 150 MG tablet Take 1 tablet (150 mg total) by mouth 2 (two) times daily. 05/17/15  Yes Gilda Crease, MD  traMADol (ULTRAM) 50 MG tablet Take 1 tablet (50 mg total) by mouth every 6 (six) hours as needed. 05/17/15  Yes Gilda Crease, MD  clorazepate (TRANXENE) 7.5 MG tablet Take 7.5 mg by mouth 2 (two) times daily as needed. For nervousness or motion sickness    Historical Provider, MD  meclizine (ANTIVERT) 25 MG tablet Take 25 mg by mouth  3 (three) times daily as needed. For vertigo/motion sickness    Historical Provider, MD   BP 141/56 mmHg  Pulse 54  Temp(Src) 98.3 F (36.8 C)  Resp 16  SpO2 99%   Physical Exam  Constitutional: She is oriented to person, place, and time. She appears well-developed and well-nourished.  HENT:  Head: Normocephalic and atraumatic.  Mouth/Throat: Oropharynx is clear and moist.  Eyes: Conjunctivae and EOM are normal. Pupils are equal, round, and reactive to light.  Neck: Normal range of  motion.  Cardiovascular: Normal rate, regular rhythm and normal heart sounds.   Pulmonary/Chest: Effort normal and breath sounds normal. No respiratory distress. She has no wheezes.  Abdominal: Soft. Bowel sounds are normal. There is tenderness in the right upper quadrant and epigastric area. There is no CVA tenderness.  Musculoskeletal: Normal range of motion.  Neurological: She is alert and oriented to person, place, and time.  Skin: Skin is warm and dry.  Psychiatric: She has a normal mood and affect.  Nursing note and vitals reviewed.   ED Course  Procedures (including critical care time) Labs Review Labs Reviewed  CBC WITH DIFFERENTIAL/PLATELET  COMPREHENSIVE METABOLIC PANEL  LIPASE, BLOOD  TROPONIN I    Imaging Review Dg Finger Thumb Right  05/17/2015  CLINICAL DATA:  Acute onset of right thumb pain and bruising. Initial encounter. EXAM: RIGHT THUMB 2+V COMPARISON:  None. FINDINGS: Mild degenerative osteophyte formation is noted about the first interphalangeal joint. There is no evidence of fracture or dislocation. No soft tissue injury is not well characterized on radiograph. Visualized joint spaces are otherwise unremarkable. IMPRESSION: No evidence of fracture or dislocation. Electronically Signed   By: Roanna Raider M.D.   On: 05/17/2015 02:09   US Abdomen Limited Ruq  05/17/2015  CLINICAL DATA:  Acute onset of right upper quadrant abdominal pain. Nausea and vomiting. Initial encounter. EXAM: US ABDOMEN LIMITED - RIGHT UPPER QUADRANT COMPARISON:  None. FINDINGS: Gallbladder: No gallstones or wall thickening visualized. No sonographic Murphy sign noted by sonographer. Common bile duct: Diameter: 0.4 cm, within normal limits in caliber. Not characterized distally. Liver: No focal lesion identified. Mildly increased parenchymal echogenicity and coarsened echotexture likely reflects fatty infiltration. IMPRESSION: 1. No acute abnormality seen at the right upper quadrant. 2. Mild  fatty infiltration within the liver. Electronically Signed   By: Roanna Raider M.D.   On: 05/17/2015 02:26   I have personally reviewed and evaluated these images and lab results as part of my medical decision-making.   EKG Interpretation None      MDM   Final diagnoses:  Abdominal pain, unspecified abdominal location  Nausea and vomiting, vomiting of unspecified type   70 year old female with abdominal pain. Evaluated in the ED 2 days ago for the same with negative workup. Patient is afebrile, nontoxic. She reports continued pain and nausea. Should one episode of emesis today, normal bowel movement. She has tenderness in her right upper quadrant epigastrium. I have reviewed her ultrasound from 2 days ago, there were no noted gallstones or wall thickening. Patient does admit to eating pickled beets prior to onset of symptoms.  Will re-check labs, given morphine, zofran, pepcid.    Lab work with slight elevation of AST and bili.  SrCr slightly elevated above baseline as well-- given elevation of BUN as well, suspect component of dehydration.  Patient given IVF as well.  Patient reports pain has improved, however she remains persistently nauseated. Will obtain CT scan for further evaluation.  CT scan  is negative for acute findings. After second dose of Zofran, patient reports she is feeling better. No active vomiting here in the ED.  She is able to tolerate water without difficulty. Will change her H2 blocker from zantac to pepcid as she seemed to respond better to this in ED.  Encouraged to follow-up with PCP.  Discussed plan with patient, he/she acknowledged understanding and agreed with plan of care.  Return precautions given for new or worsening symptoms.  Case discussed with attending physician, Dr. Estell Harpin, who agrees with assessment and plan of care.  Garlon Hatchet, PA-C 05/19/15 1610  Bethann Berkshire, MD 05/21/15 [phone]

## 2015-05-18 NOTE — ED Notes (Addendum)
Per EMS pt coming from home with c/o uper abdominal pain, seen at [facility] for same two days ago, sts was diagnosed with "overactive gallbladder", reports pain is not getting any better. Pt sts she called 911 because didn't want to wait too long to be seen. Reports 1 episode of emesis last night, none today. 4 mg zofran given en route

## 2015-05-18 NOTE — ED Notes (Signed)
Bed: WA02 Expected date:  Expected time:  Means of arrival:  Comments: EMS- Abd pain

## 2015-05-19 DIAGNOSIS — R1011 Right upper quadrant pain: Secondary | ICD-10-CM | POA: Diagnosis not present

## 2015-05-19 MED ORDER — METOCLOPRAMIDE HCL 5 MG/ML IJ SOLN
10.0000 mg | Freq: Once | INTRAMUSCULAR | Status: AC
  Administered 2015-05-19: 10 mg via INTRAMUSCULAR
  Filled 2015-05-19: qty 2

## 2015-05-19 MED ORDER — FAMOTIDINE 20 MG PO TABS: 20.0000 mg | ORAL_TABLET | Freq: Two times a day (BID) | ORAL | Status: DC

## 2015-05-19 MED ORDER — ONDANSETRON 4 MG PO TBDP: 4.0000 mg | ORAL_TABLET | Freq: Three times a day (TID) | ORAL | Status: DC | PRN

## 2015-05-19 MED ORDER — METOCLOPRAMIDE HCL 10 MG PO TABS: 10.0000 mg | ORAL_TABLET | Freq: Four times a day (QID) | ORAL | Status: DC

## 2015-05-19 MED ORDER — ONDANSETRON HCL 4 MG PO TABS
8.0000 mg | ORAL_TABLET | Freq: Once | ORAL | Status: AC
  Administered 2015-05-19: 8 mg via ORAL
  Filled 2015-05-19: qty 2

## 2015-05-19 NOTE — ED Notes (Signed)
Patient d/c'd self care.  F/U and medications discussed.  Patient and family verbalized understanding. 

## 2015-05-19 NOTE — ED Provider Notes (Signed)
Patient began to have recurrent nausea with vomiting at discharge. Additional Zofran provided. She felt her nausea was worse because her stomach is empty. Crackers attempted without success. Nausea and vomiting continue.  3:50: Reglan provided. She is feeling better. She is sipping PO fluids. She reports she would prefer to stay a little longer to insure no further vomiting.  4:25: She continues to feel better. Tolerating PO's without further vomiting. She and husband feel comfortable with discharge home.   Elpidio Anis, PA-C 05/19/15 0425  Dione Booze, MD 05/19/15 [phone]

## 2015-05-19 NOTE — ED Notes (Signed)
Pt states that she feels better and her nausea has resolved.

## 2015-05-19 NOTE — ED Notes (Signed)
Patient given zofran because she feeling nauseas. Patient states she can not move without feeling nauseas.

## 2015-05-19 NOTE — Discharge Instructions (Signed)
Take the prescribed medication as directed.  Recommend to avoid spicy or acidic foods for the next few days until your symptoms improve ( hot sauce, peppers, lemons, limes, oranges, fruit juices, etc). Follow-up with your primary care physician. Return to the ED for new or worsening symptoms.

## 2015-07-24 ENCOUNTER — Emergency Department (HOSPITAL_COMMUNITY)
Admission: EM | Admit: 2015-07-24 | Discharge: 2015-07-24 | Disposition: A | Discharge status: Home / Self Care | Payer: Medicare Other | Attending: Emergency Medicine | Admitting: Emergency Medicine

## 2015-07-24 ENCOUNTER — Emergency Department (HOSPITAL_COMMUNITY): Payer: Medicare Other

## 2015-07-24 ENCOUNTER — Encounter (HOSPITAL_COMMUNITY): Payer: Self-pay | Admitting: Emergency Medicine

## 2015-07-24 DIAGNOSIS — I10 Essential (primary) hypertension: Secondary | ICD-10-CM | POA: Diagnosis not present

## 2015-07-24 DIAGNOSIS — Z79899 Other long term (current) drug therapy: Secondary | ICD-10-CM | POA: Insufficient documentation

## 2015-07-24 DIAGNOSIS — N289 Disorder of kidney and ureter, unspecified: Secondary | ICD-10-CM | POA: Insufficient documentation

## 2015-07-24 DIAGNOSIS — E785 Hyperlipidemia, unspecified: Secondary | ICD-10-CM | POA: Insufficient documentation

## 2015-07-24 DIAGNOSIS — R002 Palpitations: Secondary | ICD-10-CM | POA: Insufficient documentation

## 2015-07-24 LAB — CBC
HCT: 37.5 % (ref 36.0–46.0)
Hemoglobin: 12.6 g/dL (ref 12.0–15.0)
MCH: 29.4 pg (ref 26.0–34.0)
MCHC: 33.6 g/dL (ref 30.0–36.0)
MCV: 87.6 fL (ref 78.0–100.0)
PLATELETS: 297 10*3/uL (ref 150–400)
RBC: 4.28 MIL/uL (ref 3.87–5.11)
RDW: 14.1 % (ref 11.5–15.5)
WBC: 7.1 10*3/uL (ref 4.0–10.5)

## 2015-07-24 LAB — BASIC METABOLIC PANEL
ANION GAP: 10 (ref 5–15)
BUN: 19 mg/dL (ref 6–20)
CALCIUM: 9.4 mg/dL (ref 8.9–10.3)
CO2: 25 mmol/L (ref 22–32)
CREATININE: 1.48 mg/dL — AB (ref 0.44–1.00)
Chloride: 102 mmol/L (ref 101–111)
GFR calc Af Amer: 40 mL/min — ABNORMAL LOW (ref >60)
GFR, EST NON AFRICAN AMERICAN: 34 mL/min — AB (ref >60)
GLUCOSE: 153 mg/dL — AB (ref 65–99)
Potassium: 4.3 mmol/L (ref 3.5–5.1)
Sodium: 137 mmol/L (ref 135–145)

## 2015-07-24 LAB — I-STAT TROPONIN, ED: Troponin i, poc: 0.01 ng/mL (ref 0.00–0.08)

## 2015-07-24 NOTE — ED Notes (Signed)
MD at bedside. 

## 2015-07-24 NOTE — ED Notes (Signed)
Pt states "I started having heart palpitations this morning and a burning sensation in my chest. I felt faint like i was going to pass out". Pt states pain 4/10. Pt states "my heart palpitations slowed down after 2-3 minutes". Pt hx of heart palpitations but never the burning. Pt in NAD, respirations e/u. VSS.

## 2015-07-24 NOTE — ED Notes (Signed)
Attempted to re-ass and unable to locate pt in waiting room.

## 2015-07-24 NOTE — ED Provider Notes (Signed)
CSN: [id number]     Arrival date & time 07/24/15  1304 History   First MD Initiated Contact with Patient 07/24/15 2150     Chief Complaint  Patient presents with  . Palpitations  . Chest Pain     (Consider location/radiation/quality/duration/timing/severity/associated sxs/prior Treatment) HPI   Jasmine Dawson is a 70 y.o. female who presents for evaluation of palpitations, twice, in the last 24 hours. Each episode lasted 2 or 3 minutes, and was accompanied with a sensation of near syncope. She has had prior episodes of palpitations, sporadically. There has been no associated fever, chills, nausea, vomiting, cough, shortness of breath or chest pain. She is taking her usual medications. There are no other known modifying factors.   Past Medical History  Diagnosis Date  . Hypertension   . Heart palpitations   . Hyperlipidemia   . Motion sickness   . Dysrhythmia     occasional PVCs   Past Surgical History  Procedure Laterality Date  . Tubal ligation  1977  . Hysteroscopy w/d&c  11/07/2011    Procedure: DILATATION AND CURETTAGE /HYSTEROSCOPY;  Surgeon: Antionette CharLisa Jackson-Moore, MD;  Location: [facility];  Service: Gynecology;  Laterality: N/A;  endometrial polypectomy   Family History  Problem Relation Age of Onset  . Diabetes Mother     died at 4291  . Heart attack Mother   . Heart attack Father     died at 2453   Social History  Substance Use Topics  . Smoking status: Never Smoker   . Smokeless tobacco: Never Used  . Alcohol Use: No   OB History    No data available     Review of Systems  All other systems reviewed and are negative.     Allergies  Prednisone; Sulfa antibiotics; Caffeine; Eggs or egg-derived products; Influenza vaccines; and Shellfish allergy  Home Medications   Prior to Admission medications   Medication Sig Start Date End Date Taking? Authorizing Provider  atorvastatin (LIPITOR) 40 MG tablet Take 40 mg by mouth at bedtime.  04/17/15   Historical  Provider, MD  clorazepate (TRANXENE) 7.5 MG tablet Take 7.5 mg by mouth 2 (two) times daily as needed. For nervousness or motion sickness    Historical Provider, MD  famotidine (PEPCID) 20 MG tablet Take 1 tablet (20 mg total) by mouth 2 (two) times daily. 05/19/15   Garlon HatchetLisa M Sanders, PA-C  lisinopril-hydrochlorothiazide (PRINZIDE,ZESTORETIC) 20-25 MG per tablet Take 1 tablet by mouth daily.    Historical Provider, MD  meclizine (ANTIVERT) 25 MG tablet Take 25 mg by mouth 3 (three) times daily as needed. For vertigo/motion sickness    Historical Provider, MD  metoCLOPramide (REGLAN) 10 MG tablet Take 1 tablet (10 mg total) by mouth every 6 (six) hours. 05/19/15   Shari Upstill, PA-C  ondansetron (ZOFRAN ODT) 4 MG disintegrating tablet Take 1 tablet (4 mg total) by mouth every 8 (eight) hours as needed for nausea. 05/19/15   Garlon HatchetLisa M Sanders, PA-C  ranitidine (ZANTAC) 150 MG tablet Take 1 tablet (150 mg total) by mouth 2 (two) times daily. 05/17/15   Gilda Creasehristopher J Pollina, MD  traMADol (ULTRAM) 50 MG tablet Take 1 tablet (50 mg total) by mouth every 6 (six) hours as needed. 05/17/15   Gilda Creasehristopher J Pollina, MD   BP 157/73 mmHg  Pulse 84  Temp(Src) 98.7 F (37.1 C) (Oral)  Resp 18  SpO2 100% Physical Exam  Constitutional: She is oriented to person, place, and time. She appears well-developed and well-nourished.  No distress (Nontoxic).  HENT:  Head: Normocephalic and atraumatic.  Right Ear: External ear normal.  Left Ear: External ear normal.  Eyes: Conjunctivae and EOM are normal. Pupils are equal, round, and reactive to light.  Neck: Normal range of motion and phonation normal. Neck supple.  Cardiovascular: Normal rate, regular rhythm and normal heart sounds.   Pulmonary/Chest: Effort normal and breath sounds normal. She exhibits no bony tenderness.  Abdominal: Soft. There is no tenderness.  Musculoskeletal: Normal range of motion.  Normal strength, arms and legs bilaterally  Neurological: She is  alert and oriented to person, place, and time. No cranial nerve deficit or sensory deficit. She exhibits normal muscle tone. Coordination normal.  No dysarthria, aphasia or nystagmus.  Skin: Skin is warm, dry and intact.  Psychiatric: She has a normal mood and affect. Her behavior is normal. Judgment and thought content normal.  Nursing note and vitals reviewed.   ED Course  Procedures (including critical care time)  Initial clinical impression- nontoxic, patient with brief episodes of palpitations, without red flag concerns.  Medications - No data to display  Patient Vitals for the past 24 hrs:  BP Temp Temp src Pulse Resp SpO2  07/24/15 2201 151/84 mmHg - - 82 14 99 %  07/24/15 1938 157/73 mmHg - - 84 18 100 %  07/24/15 1318 133/67 mmHg 98.7 F (37.1 C) Oral 105 18 97 %    10:12 PM Reevaluation with update and discussion. After initial assessment and treatment, an updated evaluation reveals Patient without additional complaints. Findings discussed with the patient, all questions were answered.[name]    Labs Review Labs Reviewed  BASIC METABOLIC PANEL - Abnormal; Notable for the following:    Glucose, Bld 153 (*)    Creatinine, Ser 1.48 (*)    GFR calc non Af Amer 34 (*)    GFR calc Af Amer 40 (*)    All other components within normal limits  CBC  I-STAT TROPOININ, ED    Imaging Review Dg Chest 2 View  07/24/2015  CLINICAL DATA:  Cardiac palpitations and chest pain EXAM: CHEST  2 VIEW COMPARISON:  None. FINDINGS: Lungs are clear. Heart size and pulmonary vascularity are normal. No adenopathy. There is mild degenerative change in thoracic spine. No pneumothorax. IMPRESSION: No edema or consolidation. Electronically Signed   By: Bretta Bang III M.D.   On: 07/24/2015 14:03   I have personally reviewed and evaluated these images and lab results as part of my medical decision-making.   EKG Interpretation   Date/Time:  Tuesday July 24 2015 13:07:52  EDT Ventricular Rate:  114 PR Interval:  166 QRS Duration: 86 QT Interval:  316 QTC Calculation: 435 R Axis:   71 Text Interpretation:  Sinus tachycardia Cannot rule out Anterior infarct ,  age undetermined Abnormal ECG Since last tracing rate faster Confirmed by  [facility]  MD, Keyion Knack [phone]) on 07/24/2015 9:58:07 PM          MDM   Final diagnoses:  None    Palpitations, with reassuring evaluation in the emergency department. Doubt ACS, metabolic instability or serious bacterial infection. Patient has incidental renal insufficiency, likely related to the combination of lisinopril and hydrochlorothiazide, which she takes for her hypertension.  Nursing Notes Reviewed/ Care Coordinated Applicable Imaging Reviewed Interpretation of Laboratory Data incorporated into ED treatment  The patient appears reasonably screened and/or stabilized for discharge and I doubt any other medical condition or other [facility] requiring further screening, evaluation, or treatment in  the ED at this time prior to discharge.  Plan: Home Medications- usual; Home Treatments- rest, increase oral fluids; return here if the recommended treatment, does not improve the symptoms; Recommended follow up- PCP check up with possible medication adjustment     Mancel Bale, MD 07/24/15 2214

## 2015-07-24 NOTE — Discharge Instructions (Signed)
Make sure you get plenty of rest, and drink some extra water each day to improve your kidney function. Your doctor may need to change your blood pressure medicine since it is possible that it is adversely affecting your renal function.    Palpitations A palpitation is the feeling that your heartbeat is irregular or is faster than normal. It may feel like your heart is fluttering or skipping a beat. Palpitations are usually not a serious problem. However, in some cases, you may need further medical evaluation. CAUSES  Palpitations can be caused by:  Smoking.  Caffeine or other stimulants, such as diet pills or energy drinks.  Alcohol.  Stress and anxiety.  Strenuous physical activity.  Fatigue.  Certain medicines.  Heart disease, especially if you have a history of irregular heart rhythms (arrhythmias), such as atrial fibrillation, atrial flutter, or supraventricular tachycardia.  An improperly working pacemaker or defibrillator. DIAGNOSIS  To find the cause of your palpitations, your health care provider will take your medical history and perform a physical exam. Your health care provider may also have you take a test called an ambulatory electrocardiogram (ECG). An ECG records your heartbeat patterns over a 24-hour period. You may also have other tests, such as:  Transthoracic echocardiogram (TTE). During echocardiography, sound waves are used to evaluate how blood flows through your heart.  Transesophageal echocardiogram (TEE).  Cardiac monitoring. This allows your health care provider to monitor your heart rate and rhythm in real time.  Holter monitor. This is a portable device that records your heartbeat and can help diagnose heart arrhythmias. It allows your health care provider to track your heart activity for several days, if needed.  Stress tests by exercise or by giving medicine that makes the heart beat faster. TREATMENT  Treatment of palpitations depends on the  cause of your symptoms and can vary greatly. Most cases of palpitations do not require any treatment other than time, relaxation, and monitoring your symptoms. Other causes, such as atrial fibrillation, atrial flutter, or supraventricular tachycardia, usually require further treatment. HOME CARE INSTRUCTIONS   Avoid:  Caffeinated coffee, tea, soft drinks, diet pills, and energy drinks.  Chocolate.  Alcohol.  Stop smoking if you smoke.  Reduce your stress and anxiety. Things that can help you relax include:  A method of controlling things in your body, such as your heartbeats, with your mind (biofeedback).  Yoga.  Meditation.  Physical activity such as swimming, jogging, or walking.  Get plenty of rest and sleep. SEEK MEDICAL CARE IF:   You continue to have a fast or irregular heartbeat beyond 24 hours.  Your palpitations occur more often. SEEK IMMEDIATE MEDICAL CARE IF:  You have chest pain or shortness of breath.  You have a severe headache.  You feel dizzy or you faint. MAKE SURE YOU:  Understand these instructions.  Will watch your condition.  Will get help right away if you are not doing well or get worse.   This information is not intended to replace advice given to you by your health care provider. Make sure you discuss any questions you have with your health care provider.   Document Released: 04/11/2000 Document Revised: 04/19/2013 Document Reviewed: 06/13/2011 Elsevier Interactive Patient Education [employer]2016 Elsevier Inc.

## 2016-09-24 ENCOUNTER — Encounter (HOSPITAL_COMMUNITY): Payer: Self-pay | Admitting: Emergency Medicine

## 2016-09-24 ENCOUNTER — Emergency Department (HOSPITAL_COMMUNITY): Payer: Medicare Other

## 2016-09-24 ENCOUNTER — Emergency Department (HOSPITAL_COMMUNITY)
Admission: EM | Admit: 2016-09-24 | Discharge: 2016-09-24 | Disposition: A | Discharge status: Home / Self Care | Payer: Medicare Other | Attending: Emergency Medicine | Admitting: Emergency Medicine

## 2016-09-24 DIAGNOSIS — I471 Supraventricular tachycardia: Secondary | ICD-10-CM | POA: Diagnosis not present

## 2016-09-24 DIAGNOSIS — I10 Essential (primary) hypertension: Secondary | ICD-10-CM | POA: Insufficient documentation

## 2016-09-24 DIAGNOSIS — Z79899 Other long term (current) drug therapy: Secondary | ICD-10-CM | POA: Insufficient documentation

## 2016-09-24 DIAGNOSIS — R51 Headache: Secondary | ICD-10-CM | POA: Insufficient documentation

## 2016-09-24 DIAGNOSIS — R1030 Lower abdominal pain, unspecified: Secondary | ICD-10-CM | POA: Insufficient documentation

## 2016-09-24 DIAGNOSIS — R002 Palpitations: Secondary | ICD-10-CM | POA: Diagnosis present

## 2016-09-24 LAB — CBC WITH DIFFERENTIAL/PLATELET
Basophils Absolute: 0 10*3/uL (ref 0.0–0.1)
Basophils Relative: 0 %
EOS PCT: 2 %
Eosinophils Absolute: 0.1 10*3/uL (ref 0.0–0.7)
HEMATOCRIT: 38.7 % (ref 36.0–46.0)
Hemoglobin: 12.9 g/dL (ref 12.0–15.0)
LYMPHS PCT: 49 %
Lymphs Abs: 3.7 10*3/uL (ref 0.7–4.0)
MCH: 29.3 pg (ref 26.0–34.0)
MCHC: 33.3 g/dL (ref 30.0–36.0)
MCV: 88 fL (ref 78.0–100.0)
MONO ABS: 0.3 10*3/uL (ref 0.1–1.0)
MONOS PCT: 4 %
NEUTROS ABS: 3.3 10*3/uL (ref 1.7–7.7)
Neutrophils Relative %: 45 %
PLATELETS: 286 10*3/uL (ref 150–400)
RBC: 4.4 MIL/uL (ref 3.87–5.11)
RDW: 13.7 % (ref 11.5–15.5)
WBC: 7.4 10*3/uL (ref 4.0–10.5)

## 2016-09-24 LAB — COMPREHENSIVE METABOLIC PANEL
ALT: 19 U/L (ref 14–54)
AST: 42 U/L — ABNORMAL HIGH (ref 15–41)
Albumin: 4 g/dL (ref 3.5–5.0)
Alkaline Phosphatase: 71 U/L (ref 38–126)
Anion gap: 12 (ref 5–15)
BILIRUBIN TOTAL: 0.6 mg/dL (ref 0.3–1.2)
BUN: 27 mg/dL — ABNORMAL HIGH (ref 6–20)
CHLORIDE: 102 mmol/L (ref 101–111)
CO2: 23 mmol/L (ref 22–32)
Calcium: 9.9 mg/dL (ref 8.9–10.3)
Creatinine, Ser: 1.59 mg/dL — ABNORMAL HIGH (ref 0.44–1.00)
GFR calc Af Amer: 36 mL/min — ABNORMAL LOW (ref >60)
GFR, EST NON AFRICAN AMERICAN: 31 mL/min — AB (ref >60)
Glucose, Bld: 172 mg/dL — ABNORMAL HIGH (ref 65–99)
POTASSIUM: 4.1 mmol/L (ref 3.5–5.1)
Sodium: 137 mmol/L (ref 135–145)
TOTAL PROTEIN: 8.2 g/dL — AB (ref 6.5–8.1)

## 2016-09-24 LAB — URINALYSIS, ROUTINE W REFLEX MICROSCOPIC
Bilirubin Urine: NEGATIVE
Glucose, UA: NEGATIVE mg/dL
Hgb urine dipstick: NEGATIVE
Ketones, ur: NEGATIVE mg/dL
Leukocytes, UA: NEGATIVE
NITRITE: NEGATIVE
PROTEIN: NEGATIVE mg/dL
SPECIFIC GRAVITY, URINE: 1.015 (ref 1.005–1.030)
pH: 8 (ref 5.0–8.0)

## 2016-09-24 LAB — MAGNESIUM: MAGNESIUM: 1.9 mg/dL (ref 1.7–2.4)

## 2016-09-24 LAB — I-STAT TROPONIN, ED: TROPONIN I, POC: 0 ng/mL (ref 0.00–0.08)

## 2016-09-24 LAB — TSH: TSH: 2.824 u[IU]/mL (ref 0.350–4.500)

## 2016-09-24 LAB — LIPASE, BLOOD: LIPASE: 52 U/L — AB (ref 11–51)

## 2016-09-24 MED ORDER — IOPAMIDOL (ISOVUE-300) INJECTION 61%
INTRAVENOUS | Status: AC
Start: 2016-09-24 — End: 2016-09-24
  Administered 2016-09-24: 75 mL
  Filled 2016-09-24: qty 75

## 2016-09-24 MED ORDER — SODIUM CHLORIDE 0.9 % IV BOLUS (SEPSIS)
1000.0000 mL | Freq: Once | INTRAVENOUS | Status: AC
  Administered 2016-09-24: 1000 mL via INTRAVENOUS

## 2016-09-24 MED ORDER — ONDANSETRON HCL 4 MG/2ML IJ SOLN
4.0000 mg | Freq: Once | INTRAMUSCULAR | Status: AC
  Administered 2016-09-24: 4 mg via INTRAVENOUS
  Filled 2016-09-24: qty 2

## 2016-09-24 NOTE — ED Notes (Signed)
Repeat EKG given to Dr. Goldston 

## 2016-09-24 NOTE — ED Provider Notes (Signed)
[facility]-EMERGENCY DEPT Provider Note   CSN: [id number] Arrival date & time: 09/24/16  0645     History   Chief Complaint Chief Complaint  Patient presents with  . Palpitations  . Abdominal Pain    HPI Jasmine Dawson is a 71 y.o. female.  HPI  71 year old female presents with multiple complaints. Her chief complaint this morning is abdominal pain as well as heart palpitations. She states she's had on and off dizziness and headaches for the past week. However she has also had these symptoms for "a while" as well, just more prominent in the last week. She complains of chronic intermittent palpitations but never this bad. No chest pain. Woke up around 4 am with acute lower abdominal pain. Also has nausea. Abd pain has significantly improved but palpitations continue. Dizziness is most prominent while walking and she feels off balance. She feels like it's her blood pressure medicine and saw her PCP last week. Was told they would changer her BP meds but hasn't gotten a new prescription yet.   Past Medical History:  Diagnosis Date  . Dysrhythmia    occasional PVCs  . Heart palpitations   . Hyperlipidemia   . Hypertension   . Motion sickness     Patient Active Problem List   Diagnosis Date Noted  . Endometrial polyp 11/07/2011  . Dizzy spells 09/11/2011  . HTN (hypertension) 09/11/2011  . Abdominal pain 09/11/2011  . Cholelithiasis 09/11/2011    Past Surgical History:  Procedure Laterality Date  . HYSTEROSCOPY W/D&C  11/07/2011   Procedure: DILATATION AND CURETTAGE /HYSTEROSCOPY;  Surgeon: Antionette CharLisa Jackson-Moore, MD;  Location: [facility];  Service: Gynecology;  Laterality: N/A;  endometrial polypectomy  . TUBAL LIGATION  1977    OB History    No data available       Home Medications    Prior to Admission medications   Medication Sig Start Date End Date Taking? Authorizing Provider  clorazepate (TRANXENE) 7.5 MG tablet Take 7.5 mg by mouth 2 (two) times daily as needed.  palpitations   Yes [provider]  famotidine (PEPCID) 20 MG tablet Take 1 tablet (20 mg total) by mouth 2 (two) times daily. 05/19/15  Yes Garlon HatchetSanders, Lisa M, PA-C  lisinopril-hydrochlorothiazide (PRINZIDE,ZESTORETIC) 20-25 MG per tablet Take 1 tablet by mouth daily.   Yes [provider]  metoCLOPramide (REGLAN) 10 MG tablet Take 1 tablet (10 mg total) by mouth every 6 (six) hours. Patient not taking: Reported on 09/24/2016 05/19/15   Elpidio AnisUpstill, Shari, PA-C  ondansetron (ZOFRAN ODT) 4 MG disintegrating tablet Take 1 tablet (4 mg total) by mouth every 8 (eight) hours as needed for nausea. Patient not taking: Reported on 09/24/2016 05/19/15   Garlon HatchetSanders, Lisa M, PA-C  ranitidine (ZANTAC) 150 MG tablet Take 1 tablet (150 mg total) by mouth 2 (two) times daily. Patient not taking: Reported on 09/24/2016 05/17/15   Gilda CreasePollina, Christopher J, MD  traMADol (ULTRAM) 50 MG tablet Take 1 tablet (50 mg total) by mouth every 6 (six) hours as needed. Patient not taking: Reported on 09/24/2016 05/17/15   Gilda CreasePollina, Christopher J, MD    Family History Family History  Problem Relation Age of Onset  . Diabetes Mother        died at 3491  . Heart attack Mother   . Heart attack Father        died at 8053    Social History Social History  Substance Use Topics  . Smoking status: Never Smoker  . Smokeless  tobacco: Never Used  . Alcohol use No     Allergies   Prednisone; Sulfa antibiotics; Caffeine; Eggs or egg-derived products; Influenza vaccines; Other; and Shellfish allergy   Review of Systems Review of Systems  Respiratory: Negative for shortness of breath.   Cardiovascular: Positive for palpitations. Negative for chest pain.  Gastrointestinal: Positive for abdominal pain and nausea. Negative for vomiting.  Genitourinary: Negative for dysuria.  Neurological: Positive for dizziness and headaches.  All other systems reviewed and are negative.    Physical Exam Updated Vital Signs BP 134/61    Pulse (!) 57   Temp 98.2 F (36.8 C) (Oral)   Resp 14   SpO2 98%   Physical Exam  Constitutional: She is oriented to person, place, and time. She appears well-developed and well-nourished. She appears distressed.  HENT:  Head: Normocephalic and atraumatic.  Right Ear: External ear normal.  Left Ear: External ear normal.  Nose: Nose normal.  Eyes: EOM are normal. Pupils are equal, round, and reactive to light. Right eye exhibits no discharge. Left eye exhibits no discharge.  Cardiovascular: Regular rhythm and normal heart sounds.  Tachycardia present.   Pulses:      Radial pulses are 2+ on the right side, and 2+ on the left side.  Pulmonary/Chest: Effort normal and breath sounds normal.  Abdominal: Soft. There is tenderness (diffuse).  Neurological: She is alert and oriented to person, place, and time.  CN 3-12 grossly intact. 5/5 strength in all 4 extremities. Grossly normal sensation. Normal finger to nose.   Skin: Skin is warm and dry. She is not diaphoretic.  Nursing note and vitals reviewed.    ED Treatments / Results  Labs (all labs ordered are listed, but only abnormal results are displayed) Labs Reviewed  COMPREHENSIVE METABOLIC PANEL - Abnormal; Notable for the following:       Result Value   Glucose, Bld 172 (*)    BUN 27 (*)    Creatinine, Ser 1.59 (*)    Total Protein 8.2 (*)    AST 42 (*)    GFR calc non Af Amer 31 (*)    GFR calc Af Amer 36 (*)    All other components within normal limits  LIPASE, BLOOD - Abnormal; Notable for the following:    Lipase 52 (*)    All other components within normal limits  URINALYSIS, ROUTINE W REFLEX MICROSCOPIC - Abnormal; Notable for the following:    Color, Urine STRAW (*)    All other components within normal limits  CBC WITH DIFFERENTIAL/PLATELET  MAGNESIUM  TSH  I-STAT TROPOININ, ED    EKG  EKG Interpretation  Date/Time:  Wednesday Sep 24 2016 06:54:57 EDT Ventricular Rate:  162 PR Interval:    QRS  Duration: 94 QT Interval:  282 QTC Calculation: 462 R Axis:   90 Text Interpretation:  Supraventricular tachycardia Rightward axis Nonspecific ST abnormality Abnormal ECG When compared with ECG of 07/24/2015, Supraventricular tachycardia has replaced Sinus tachycardia HEART RATE has increased Nonspecific ST abnormality is now present - probably rate-related Confirmed by Dione Booze ([id number]) on 09/24/2016 6:59:36 AM       EKG Interpretation  Date/Time:  Wednesday Sep 24 2016 07:21:47 EDT Ventricular Rate:  78 PR Interval:    QRS Duration: 103 QT Interval:  382 QTC Calculation: 436 R Axis:   60 Text Interpretation:  Sinus rhythm Low voltage, precordial leads SVT has resolved ST changes no longer present Confirmed by Pricilla Loveless [phone]) on 09/24/2016 7:24:51 AM  Radiology Dg Chest 2 View  Result Date: 09/24/2016 CLINICAL DATA:  Lightheadedness with supraventricular tachycardia. EXAM: CHEST  2 VIEW COMPARISON:  07/24/2015 FINDINGS: The lungs are clear wiithout focal pneumonia, edema, pneumothorax or pleural effusion. The cardiopericardial silhouette is within normal limits for size. The visualized bony structures of the thorax are intact. Degenerative changes noted left shoulder. Telemetry leads overlie the chest. IMPRESSION: Stable.  No acute cardiopulmonary findings. Electronically Signed   By: Kennith Center M.D.   On: 09/24/2016 08:09   Ct Head Wo Contrast  Result Date: 09/24/2016 CLINICAL DATA:  Frontal headache since yesterday.  Dizziness. EXAM: CT HEAD WITHOUT CONTRAST TECHNIQUE: Contiguous axial images were obtained from the base of the skull through the vertex without intravenous contrast. COMPARISON:  Head CT scan 11/23/2004. FINDINGS: Brain: Patchy hypoattenuation in the periventricular and subcortical deep white matter is most consistent with chronic microvascular ischemic change and has progressed since the prior exam. No evidence of acute abnormality including hemorrhage,  infarct, mass lesion, mass effect, midline shift or abnormal extra-axial fluid collection. No hydrocephalus or pneumocephalus. Vascular:  Atherosclerosis is noted. Skull: Intact. Sinuses/Orbits: Negative. Other:  None. IMPRESSION: No acute abnormality. Chronic microvascular ischemic change. Atherosclerosis. Electronically Signed   By: Drusilla Kanner M.D.   On: 09/24/2016 10:09   Ct Abdomen Pelvis W Contrast  Result Date: 09/24/2016 CLINICAL DATA:  Low abdominal pain with nausea starting this morning. EXAM: CT ABDOMEN AND PELVIS WITH CONTRAST TECHNIQUE: Multidetector CT imaging of the abdomen and pelvis was performed using the standard protocol following bolus administration of intravenous contrast. CONTRAST:  75mL ISOVUE-300 IOPAMIDOL (ISOVUE-300) INJECTION 61% COMPARISON:  05/18/2015 FINDINGS: Lower chest:  No contributory findings. Hepatobiliary: Hepatic steatosis with sparing of the posterior right lobe, chronic. Interval but simple appearing 15 mm cyst along the falciform ligament.Cholelithiasis. No evidence of cholecystitis or biliary obstruction. Pancreas: Unremarkable. Spleen: Unremarkable. Adrenals/Urinary Tract: Negative adrenals. No hydronephrosis or stone. Tiny presumed cyst in the right kidney. Unremarkable bladder. Stomach/Bowel:  No obstruction. No appendicitis. Vascular/Lymphatic: No acute vascular abnormality. Atherosclerosis. No mass or adenopathy. Reproductive:No pathologic findings. Other: No ascites or pneumoperitoneum. Musculoskeletal: No acute abnormalities. Diffuse spondylosis and degenerative disc narrowing. Facet spurring. IMPRESSION: 1. No acute finding to explain abdominal pain. 2. Hepatic steatosis. 3. Cholelithiasis. 4.  Aortic Atherosclerosis (ICD10-I70.0). Electronically Signed   By: Marnee Spring M.D.   On: 09/24/2016 10:05    Procedures Procedures (including critical care time)   Medications Ordered in ED Medications  sodium chloride 0.9 % bolus 1,000 mL (0 mLs  Intravenous Stopped 09/24/16 1010)  ondansetron (ZOFRAN) injection 4 mg (4 mg Intravenous Given 09/24/16 0739)  iopamidol (ISOVUE-300) 61 % injection (75 mLs  Contrast Given 09/24/16 0929)     Initial Impression / Assessment and Plan / ED Course  I have reviewed the triage vital signs and the nursing notes.  Pertinent labs & imaging results that were available during my care of the patient were reviewed by me and considered in my medical decision making (see chart for details).  Clinical Course as of Sep 24 1409  Wed Sep 24, 2016  0718 Patient's HR converted with Revert maneuver. Palpitations gone.   [SG]    Clinical Course User Index [SG] Pricilla Loveless, MD    Patient presents with symptomatically SVT. This was converted into a sinus rhythm with the revert maneuver. Patient has been feeling much better since. She was also given Zofran for nausea and throughout her ED stay she is essentially now asymptomatic. She states  her abdominal pain is gone. Her abdominal pain was lower and she was tender in the ED although is possible this was a chest pain equivalent due to the SVT. However her troponin is negative after several hours of arrhythmia. I think ACS is unlikely. Her dizziness appears to be acute on chronic and at this point she has no acute neurologic exam findings to suggest stroke or other acute CNS process. Will discharge home to follow-up with PCP for medication adjustment as she is concerned the medicine is causing the dizziness. Follow up with cardiology for the SVT. Discussed the Valsalva maneuver at home if she were to have recurrent symptoms.  Final Clinical Impressions(s) / ED Diagnoses   Final diagnoses:  SVT (supraventricular tachycardia) (HCC)  Lower abdominal pain    New Prescriptions Discharge Medication List as of 09/24/2016  1:20 PM       Pricilla Loveless, MD 09/24/16 1415

## 2016-09-24 NOTE — ED Notes (Signed)
ED Provider at bedside. 

## 2016-09-24 NOTE — ED Notes (Signed)
Patient transported to CT 

## 2016-09-24 NOTE — ED Triage Notes (Signed)
Patient reports palpitations with dizziness / lightheaded onset this morning , pt. added low abdominal pain and right upper thigh pain , denies chest pain or SOB , HR= 160's at arrival . EKG reviewed by Dr. Preston FleetingGlick .

## 2016-09-26 ENCOUNTER — Encounter: Payer: Self-pay | Admitting: *Deleted

## 2016-10-02 ENCOUNTER — Ambulatory Visit (INDEPENDENT_AMBULATORY_CARE_PROVIDER_SITE_OTHER): Payer: Medicare Other | Admitting: Interventional Cardiology

## 2016-10-02 VITALS — BP 170/76 | HR 69 | Ht 63.0 in | Wt 188.8 lb

## 2016-10-02 DIAGNOSIS — N183 Chronic kidney disease, stage 3 unspecified: Secondary | ICD-10-CM

## 2016-10-02 DIAGNOSIS — I471 Supraventricular tachycardia: Secondary | ICD-10-CM

## 2016-10-02 DIAGNOSIS — I10 Essential (primary) hypertension: Secondary | ICD-10-CM

## 2016-10-02 MED ORDER — CARVEDILOL 3.125 MG PO TABS: 3.1250 mg | ORAL_TABLET | Freq: Two times a day (BID) | ORAL | 3 refills | Status: DC

## 2016-10-02 MED ORDER — CARVEDILOL 3.125 MG PO TABS
3.1250 mg | ORAL_TABLET | Freq: Two times a day (BID) | ORAL | 3 refills | Status: DC
Start: 2016-10-02 — End: 2016-10-02

## 2016-10-02 NOTE — Patient Instructions (Signed)
Medication Instructions:  Your physician has recommended you make the following change in your medication:   START carvedilol 3.125 mg twice a day   Labwork: None ordered  Testing/Procedures: None ordered  Follow-Up: Your physician recommends that you schedule a follow-up appointment in: 2 weeks in the [facility]    Any Other Special Instructions Will Be Listed Below (If Applicable).     If you need a refill on your cardiac medications before your next appointment, please call your pharmacy.

## 2016-10-02 NOTE — Progress Notes (Signed)
Cardiology Office Note   Date:  10/02/2016   ID:  [name], DOB June 24, 1945, MRN [id number]  PCP:  Alwyn PeaMartin, Tanya, MD    No chief complaint on file.  HTN, palpitations  Wt Readings from Last 3 Encounters:  10/02/16 188 lb 12.8 oz (85.6 kg)  11/07/11 196 lb (88.9 kg)  10/31/11 196 lb (88.9 kg)       History of Present Illness: Jasmine Dawson is a 71 y.o. female  Who has food allergies.   She notes that if she has something that she is allergic to, like caffeine, she feels her heart racing and irregular or a few seconds.  Some spices can cause some palpitations.  If she stays away from these foods, she has no palpitations.  She has had high BP readings as well.  She was diagnosed for the first time in the 1990s.  She was started on medicine at that time but it did not work.  She thinks one was Cardizem, but it did not work and caused her heart to flutter.  She had swelling with Norvasc.  Her sister has HTN and takes atenolol.   The patient checks her BP at home and gets readings in the 107-117 systolic, recently.  BP readings in the MD office are high.  She has stress from he longtime dog getting ill.    She has hair loss and is concerned that it is the lisinopril.  She wants to see if there is another medicine that can be used to substitute.  She has not been walking on the treadmill due to caring for her 73 year old dog.    She went to the ER after feeling palpitations.  SHe used some old Flonase and then had SVT [place] at [id number].  Broke with Valsalva.    Past Medical History:  Diagnosis Date  . Dysrhythmia    occasional PVCs  . Heart palpitations   . Hyperlipidemia   . Hypertension   . Motion sickness     Past Surgical History:  Procedure Laterality Date  . HYSTEROSCOPY W/D&C  11/07/2011   Procedure: DILATATION AND CURETTAGE /HYSTEROSCOPY;  Surgeon: Antionette CharLisa Jackson-Moore, MD;  Location: [facility];  Service: Gynecology;  Laterality: N/A;  endometrial  polypectomy  . TUBAL LIGATION  1977     Current Outpatient Prescriptions  Medication Sig Dispense Refill  . clorazepate (TRANXENE) 7.5 MG tablet Take 7.5 mg by mouth 2 (two) times daily as needed. palpitations    . famotidine (PEPCID) 20 MG tablet Take 1 tablet (20 mg total) by mouth 2 (two) times daily. 30 tablet 0  . lisinopril-hydrochlorothiazide (PRINZIDE,ZESTORETIC) 20-25 MG per tablet Take 1 tablet by mouth daily.     No current facility-administered medications for this visit.     Allergies:   Prednisone; Sulfa antibiotics; Caffeine; Eggs or egg-derived products; Influenza vaccines; Other; and Shellfish allergy    Social History:  The patient  reports that she has never smoked. She has never used smokeless tobacco. She reports that she does not drink alcohol or use drugs.   Family History:  The patient's family history includes Diabetes in her mother; Heart attack in her father and mother.    ROS:  Please see the history of present illness.   Otherwise, review of systems are positive for hair loss.   All other systems are reviewed and negative.    PHYSICAL EXAM: VS:  BP (!) 170/76   Pulse 69   Ht 5\' 3"  (  1.6 m)   Wt 188 lb 12.8 oz (85.6 kg)   SpO2 99%   BMI 33.44 kg/m  , BMI Body mass index is 33.44 kg/m. GEN: Well nourished, well developed, in no acute distress  HEENT: normal  Neck: no JVD, carotid bruits, or masses Cardiac: RRR; no murmurs, rubs, or gallops,no edema  Respiratory:  clear to auscultation bilaterally, normal work of breathing GI: soft, nontender, nondistended, + BS MS: no deformity or atrophy  Skin: warm and dry, no rash Neuro:  Strength and sensation are intact Psych: euthymic mood, full affect   EKG:   The ekg from 5/30 demonstrates SVT, HR 163, then subsequent ECG shows NSR   Recent Labs: 09/24/2016: ALT 19; BUN 27; Creatinine, Ser 1.59; Hemoglobin 12.9; Magnesium 1.9; Platelets 286; Potassium 4.1; Sodium 137; TSH 2.824   Lipid Panel No  results found for: CHOL, TRIG, HDL, CHOLHDL, VLDL, LDLCALC, LDLDIRECT   Other studies Reviewed: Additional studies/ records that were reviewed today with results demonstrating: old ECGs from the ER reviewed.   ASSESSMENT AND PLAN:  1. SVT: Start Coreg 3.125 mg BID.  She has been taught the Valsalva maneuver to help stop her palpitations. Try manage medically.  If she has refractory sx, would refer to EP. 2. HTN: Based on her home readings, her blood pressure is well controlled. We will have her bring her low pressure cuff to visit with the Pharm.D. hypertension clinic in 2 weeks. I reviewed her medicines with the Pharm.D. in our office to see about the likelihood of hair loss. She felt that it was unlikely that lisinopril, HCTZ or carvedilol would cause significant hair loss. 3. CRI: Cr. 1.6.  THis would limit increasing lisinopril dose.    Current medicines are reviewed at length with the patient today.  The patient concerns regarding her medicines were addressed.  The following changes have been made:  No change  Labs/ tests ordered today include:  No orders of the defined types were placed in this encounter.   Recommend 150 minutes/week of aerobic exercise Low fat, low carb, high fiber diet recommended  Disposition:   FU in 2 week Pharm D HTN clinic   Signed, Lance Muss, MD  10/02/2016 4:26 PM    [facility] [address] [place], [place], [place]  [address] Phone: [phone]; Fax: [phone]

## 2016-10-03 ENCOUNTER — Encounter: Payer: Self-pay | Admitting: Interventional Cardiology

## 2016-10-03 DIAGNOSIS — N1832 Chronic kidney disease, stage 3b: Secondary | ICD-10-CM | POA: Insufficient documentation

## 2016-10-03 DIAGNOSIS — I471 Supraventricular tachycardia: Secondary | ICD-10-CM | POA: Insufficient documentation

## 2016-10-03 DIAGNOSIS — N183 Chronic kidney disease, stage 3 unspecified: Secondary | ICD-10-CM | POA: Insufficient documentation

## 2016-10-06 ENCOUNTER — Telehealth: Payer: Self-pay | Admitting: Interventional Cardiology

## 2016-10-06 NOTE — Telephone Encounter (Signed)
Patient calling, states the carvedilol medication made her feel extremely dizzy and that her BP dropped to 104/58. Patient states that when she took the first pill on Saturday, she felt she would faint. Patient would like to continue taking what she was on and to do daily walks. Please call to discuss, thanks.

## 2016-10-06 NOTE — Telephone Encounter (Signed)
SPOKE  WITH  PT   AFTER  TAKING  CARVEDILOL ON SAT  FELT  BAD  CHEST FELT  HEAVY,  DIZZY  FEELING  ,FELT LIKE  WAS GOING  TO PASS OUT ,  FEELS  WEIRD  HAS  STOPPED  CARVEDILOL CONTINUES   TO  TAKE   LISINOPRIL  AS  PREVIOUS   HEART  RATE  OKAY PER  PT  HAD  BEEN  UNDER A LOT OF STRESS NOT  SURE  IF  THAT  CAUSED SVT .WILL FORWARD TO DR  Eldridge DaceVARANASI FOR  REVIEW .Zack Seal/CY

## 2016-10-07 NOTE — Telephone Encounter (Signed)
OK.  If SVT comes back, could try metoprolol 12.5 mg BID.  F/u for HTN clinic visit as planned.

## 2016-10-07 NOTE — Telephone Encounter (Signed)
Patient made aware of Dr. Hoyle BarrVaranasi's recommendations. Patient's appointment in the HTN clinic was cancelled when she called yesterday. Patient does not want to reschedule the appointment at this time. She states that her BP has been fine at home (last readings per patient: 115/64, 113/65, 116/66) without the carvedilol, with just taking the lisinopril-HCTZ. Patient advised to continue to monitor BP and let us know if her SVT returns. Patient verbalizes understanding and appreciated the call.

## 2016-10-17 ENCOUNTER — Ambulatory Visit: Payer: Medicare Other

## 2017-10-31 ENCOUNTER — Other Ambulatory Visit: Payer: Self-pay

## 2017-10-31 ENCOUNTER — Observation Stay (HOSPITAL_COMMUNITY): Payer: Medicare Other

## 2017-10-31 ENCOUNTER — Observation Stay (HOSPITAL_COMMUNITY)
Admission: EM | Admit: 2017-10-31 | Discharge: 2017-11-01 | Disposition: A | Discharge status: Home / Self Care | Payer: Medicare Other | Attending: Internal Medicine | Admitting: Internal Medicine

## 2017-10-31 ENCOUNTER — Encounter (HOSPITAL_COMMUNITY): Payer: Self-pay | Admitting: Emergency Medicine

## 2017-10-31 DIAGNOSIS — N183 Chronic kidney disease, stage 3 unspecified: Secondary | ICD-10-CM | POA: Diagnosis present

## 2017-10-31 DIAGNOSIS — Z79899 Other long term (current) drug therapy: Secondary | ICD-10-CM | POA: Insufficient documentation

## 2017-10-31 DIAGNOSIS — R002 Palpitations: Secondary | ICD-10-CM | POA: Insufficient documentation

## 2017-10-31 DIAGNOSIS — R748 Abnormal levels of other serum enzymes: Secondary | ICD-10-CM

## 2017-10-31 DIAGNOSIS — I471 Supraventricular tachycardia: Secondary | ICD-10-CM | POA: Diagnosis not present

## 2017-10-31 DIAGNOSIS — R079 Chest pain, unspecified: Secondary | ICD-10-CM | POA: Diagnosis present

## 2017-10-31 DIAGNOSIS — R7989 Other specified abnormal findings of blood chemistry: Secondary | ICD-10-CM | POA: Insufficient documentation

## 2017-10-31 DIAGNOSIS — I10 Essential (primary) hypertension: Secondary | ICD-10-CM | POA: Diagnosis present

## 2017-10-31 DIAGNOSIS — I129 Hypertensive chronic kidney disease with stage 1 through stage 4 chronic kidney disease, or unspecified chronic kidney disease: Secondary | ICD-10-CM | POA: Diagnosis not present

## 2017-10-31 DIAGNOSIS — M79604 Pain in right leg: Secondary | ICD-10-CM | POA: Diagnosis present

## 2017-10-31 DIAGNOSIS — R778 Other specified abnormalities of plasma proteins: Secondary | ICD-10-CM | POA: Diagnosis present

## 2017-10-31 DIAGNOSIS — N1832 Chronic kidney disease, stage 3b: Secondary | ICD-10-CM | POA: Diagnosis present

## 2017-10-31 HISTORY — DX: Supraventricular tachycardia, unspecified: I47.10

## 2017-10-31 HISTORY — DX: Supraventricular tachycardia: I47.1

## 2017-10-31 LAB — BASIC METABOLIC PANEL
ANION GAP: 10 (ref 5–15)
BUN: 19 mg/dL (ref 8–23)
CHLORIDE: 106 mmol/L (ref 98–111)
CO2: 22 mmol/L (ref 22–32)
Calcium: 8.9 mg/dL (ref 8.9–10.3)
Creatinine, Ser: 1.6 mg/dL — ABNORMAL HIGH (ref 0.44–1.00)
GFR calc Af Amer: 36 mL/min — ABNORMAL LOW (ref >60)
GFR, EST NON AFRICAN AMERICAN: 31 mL/min — AB (ref >60)
Glucose, Bld: 136 mg/dL — ABNORMAL HIGH (ref 70–99)
POTASSIUM: 4.6 mmol/L (ref 3.5–5.1)
SODIUM: 138 mmol/L (ref 135–145)

## 2017-10-31 LAB — I-STAT TROPONIN, ED: Troponin i, poc: 0.04 ng/mL (ref 0.00–0.08)

## 2017-10-31 LAB — TROPONIN I
TROPONIN I: 0.04 ng/mL — AB (ref <0.03)
TROPONIN I: 0.06 ng/mL — AB (ref <0.03)

## 2017-10-31 LAB — D-DIMER, QUANTITATIVE (NOT AT ARMC): D DIMER QUANT: 9.18 ug{FEU}/mL — AB (ref 0.00–0.50)

## 2017-10-31 LAB — CBC
HCT: 41.7 % (ref 36.0–46.0)
HEMOGLOBIN: 13 g/dL (ref 12.0–15.0)
MCH: 28.8 pg (ref 26.0–34.0)
MCHC: 31.2 g/dL (ref 30.0–36.0)
MCV: 92.5 fL (ref 78.0–100.0)
Platelets: 269 10*3/uL (ref 150–400)
RBC: 4.51 MIL/uL (ref 3.87–5.11)
RDW: 14.5 % (ref 11.5–15.5)
WBC: 6.9 10*3/uL (ref 4.0–10.5)

## 2017-10-31 LAB — TSH: TSH: 1.728 u[IU]/mL (ref 0.350–4.500)

## 2017-10-31 MED ORDER — METOPROLOL TARTRATE 25 MG PO TABS
12.5000 mg | ORAL_TABLET | Freq: Once | ORAL | Status: AC
  Administered 2017-10-31: 12.5 mg via ORAL
  Filled 2017-10-31: qty 1

## 2017-10-31 MED ORDER — SODIUM CHLORIDE 0.9 % IV BOLUS
500.0000 mL | Freq: Once | INTRAVENOUS | Status: AC
  Administered 2017-10-31: 500 mL via INTRAVENOUS

## 2017-10-31 MED ORDER — TRAZODONE HCL 50 MG PO TABS: 25.0000 mg | ORAL_TABLET | Freq: Every evening | ORAL | Status: DC | PRN

## 2017-10-31 MED ORDER — NEBIVOLOL HCL 5 MG PO TABS: 5.0000 mg | ORAL_TABLET | Freq: Every day | ORAL | Status: DC

## 2017-10-31 MED ORDER — METOPROLOL SUCCINATE ER 50 MG PO TB24
50.0000 mg | ORAL_TABLET | Freq: Every day | ORAL | Status: DC
  Administered 2017-11-01: 50 mg via ORAL
  Filled 2017-10-31: qty 1

## 2017-10-31 MED ORDER — IOPAMIDOL (ISOVUE-370) INJECTION 76%
INTRAVENOUS | Status: AC
  Filled 2017-10-31: qty 100

## 2017-10-31 MED ORDER — ENOXAPARIN SODIUM 40 MG/0.4ML ~~LOC~~ SOLN
40.0000 mg | SUBCUTANEOUS | Status: DC
  Administered 2017-10-31: 40 mg via SUBCUTANEOUS
  Filled 2017-10-31: qty 0.4

## 2017-10-31 MED ORDER — METOPROLOL TARTRATE 5 MG/5ML IV SOLN
2.5000 mg | Freq: Once | INTRAVENOUS | Status: AC
  Administered 2017-10-31: 2.5 mg via INTRAVENOUS
  Filled 2017-10-31: qty 5

## 2017-10-31 MED ORDER — ACETAMINOPHEN 650 MG RE SUPP: 650.0000 mg | Freq: Four times a day (QID) | RECTAL | Status: DC | PRN

## 2017-10-31 MED ORDER — SENNOSIDES-DOCUSATE SODIUM 8.6-50 MG PO TABS: 1.0000 | ORAL_TABLET | Freq: Every evening | ORAL | Status: DC | PRN

## 2017-10-31 MED ORDER — IOPAMIDOL (ISOVUE-370) INJECTION 76%
80.0000 mL | Freq: Once | INTRAVENOUS | Status: AC | PRN
  Administered 2017-10-31: 100 mL via INTRAVENOUS

## 2017-10-31 MED ORDER — ACETAMINOPHEN 325 MG PO TABS: 650.0000 mg | ORAL_TABLET | Freq: Four times a day (QID) | ORAL | Status: DC | PRN

## 2017-10-31 MED ORDER — CLORAZEPATE DIPOTASSIUM 3.75 MG PO TABS: 7.5000 mg | ORAL_TABLET | Freq: Two times a day (BID) | ORAL | Status: DC | PRN

## 2017-10-31 MED ORDER — FAMOTIDINE 20 MG PO TABS
20.0000 mg | ORAL_TABLET | Freq: Two times a day (BID) | ORAL | Status: DC
  Administered 2017-10-31 – 2017-11-01 (×2): 20 mg via ORAL
  Filled 2017-10-31 (×3): qty 1

## 2017-10-31 MED ORDER — ONDANSETRON HCL 4 MG/2ML IJ SOLN: 4.0000 mg | Freq: Four times a day (QID) | INTRAMUSCULAR | Status: DC | PRN

## 2017-10-31 MED ORDER — ADENOSINE 6 MG/2ML IV SOLN
6.0000 mg | Freq: Once | INTRAVENOUS | Status: DC
  Administered 2017-10-31: 6 mg via INTRAVENOUS
  Filled 2017-10-31: qty 2

## 2017-10-31 MED ORDER — ONDANSETRON HCL 4 MG PO TABS: 4.0000 mg | ORAL_TABLET | Freq: Four times a day (QID) | ORAL | Status: DC | PRN

## 2017-10-31 NOTE — ED Provider Notes (Addendum)
[facility] Provider Note   CSN: [id number] Arrival date & time: 10/31/17  1024     History   Chief Complaint Chief Complaint  Patient presents with  . Leg Pain  . Tachycardia    HPI Jasmine Dawson is a 72 y.o. female.  Patient presents the emergency department with complaint of right leg pain as well as intermittent episodes of palpitations and chest heaviness for the past 2 days.  Patient states that while she was in the shower today she developed a very severe burning pain in her right foot to her right knee which made it hard for her to walk.  This has gradually improved after EMS was called.  She also describes shortness of breath, chest heaviness intermittently over the past 2 days.  She denies lightheadedness or syncope.  No chest pain or radiating pain.  No vomiting or diarrhea.  No abdominal pains.  Patient states that she has been on some medications for blood pressure that it made her feel very dizzy. The onset of this condition was acute. The course is improving.       Past Medical History:  Diagnosis Date  . Dysrhythmia    occasional PVCs  . Heart palpitations   . Hyperlipidemia   . Hypertension   . Motion sickness     Patient Active Problem List   Diagnosis Date Noted  . SVT (supraventricular tachycardia) (HCC) 10/03/2016  . CRI (chronic renal insufficiency), stage 3 (moderate) (HCC) 10/03/2016  . Endometrial polyp 11/07/2011  . Dizzy spells 09/11/2011  . HTN (hypertension) 09/11/2011  . Abdominal pain 09/11/2011  . Cholelithiasis 09/11/2011    Past Surgical History:  Procedure Laterality Date  . HYSTEROSCOPY W/D&C  11/07/2011   Procedure: DILATATION AND CURETTAGE /HYSTEROSCOPY;  Surgeon: Antionette CharLisa Jackson-Moore, MD;  Location: [facility];  Service: Gynecology;  Laterality: N/A;  endometrial polypectomy  . TUBAL LIGATION  1977     OB History   None      Home Medications    Prior to Admission medications     Medication Sig Start Date End Date Taking? Authorizing Provider  clorazepate (TRANXENE) 7.5 MG tablet Take 7.5 mg by mouth 2 (two) times daily as needed. palpitations    [provider]  famotidine (PEPCID) 20 MG tablet Take 1 tablet (20 mg total) by mouth 2 (two) times daily. 05/19/15   Garlon HatchetSanders, Lisa M, PA-C  lisinopril-hydrochlorothiazide (PRINZIDE,ZESTORETIC) 20-25 MG per tablet Take 1 tablet by mouth daily.    [provider]    Family History Family History  Problem Relation Age of Onset  . Diabetes Mother        died at 2691  . Heart attack Mother   . Heart attack Father        died at 3653    Social History Social History   Tobacco Use  . Smoking status: Never Smoker  . Smokeless tobacco: Never Used  Substance Use Topics  . Alcohol use: No  . Drug use: No     Allergies   Prednisone; Sulfa antibiotics; Caffeine; Eggs or egg-derived products; Influenza vaccines; Other; and Shellfish allergy   Review of Systems Review of Systems  Constitutional: Negative for diaphoresis and fever.  Eyes: Negative for redness.  Respiratory: Positive for shortness of breath. Negative for cough.   Cardiovascular: Positive for chest pain and palpitations. Negative for leg swelling.  Gastrointestinal: Negative for abdominal pain, nausea and vomiting.  Genitourinary: Negative for dysuria.  Musculoskeletal: Positive  for myalgias. Negative for back pain and neck pain.  Skin: Negative for rash.  Neurological: Negative for syncope and light-headedness.  Psychiatric/Behavioral: The patient is not nervous/anxious.      Physical Exam Updated Vital Signs BP (!) 143/92 (BP Location: Right Arm)   Pulse (!) 141   Temp 98.6 F (37 C) (Oral)   Resp 15   Ht 5\' 4"  (1.626 m)   Wt 83.9 kg (185 lb)   SpO2 98%   BMI 31.76 kg/m   Physical Exam  Constitutional: She appears well-developed and well-nourished.  HENT:  Head: Normocephalic and atraumatic.  Mouth/Throat: Mucous  membranes are normal. Mucous membranes are not dry.  Eyes: Conjunctivae are normal.  Neck: Trachea normal and normal range of motion. Neck supple. Normal carotid pulses and no JVD present. No muscular tenderness present. Carotid bruit is not present. No tracheal deviation present.  Cardiovascular: Regular rhythm, S1 normal, S2 normal, normal heart sounds and intact distal pulses. Tachycardia present. Exam reveals no decreased pulses.  No murmur heard. Pulmonary/Chest: Effort normal. No respiratory distress. She has no wheezes. She exhibits no tenderness.  Abdominal: Soft. Normal aorta and bowel sounds are normal. There is no tenderness. There is no rebound and no guarding.  Musculoskeletal: Normal range of motion. She exhibits edema. She exhibits no tenderness.  Trace pitting edema bilateral ankles, no redness, swelling or other clinical s/s of DVT.   Neurological: She is alert.  Skin: Skin is warm and dry. She is not diaphoretic. No cyanosis. No pallor.  Psychiatric: She has a normal mood and affect.  Nursing note and vitals reviewed.    ED Treatments / Results  Labs (all labs ordered are listed, but only abnormal results are displayed) Labs Reviewed  BASIC METABOLIC PANEL - Abnormal; Notable for the following components:      Result Value   Glucose, Bld 136 (*)    Creatinine, Ser 1.60 (*)    GFR calc non Af Amer 31 (*)    GFR calc Af Amer 36 (*)    All other components within normal limits  CBC  I-STAT TROPONIN, ED    EKG EKG Interpretation  Date/Time:  Saturday October 31 2017 10:39:20 EDT Ventricular Rate:  138 PR Interval:    QRS Duration: 102 QT Interval:  302 QTC Calculation: 458 R Axis:   96 Text Interpretation:  Right and left arm electrode reversal, interpretation assumes no reversal Junctional tachycardia Right axis deviation Probable lateral infarct, age indeterminate Confirmed by Kristine Royal [phone]) on 10/31/2017 10:42:19 AM   Radiology No results  found.  Procedures Procedures (including critical care time)  Medications Ordered in ED Medications  adenosine (ADENOCARD) 6 MG/2ML injection 6 mg (has no administration in time range)  metoprolol tartrate (LOPRESSOR) tablet 12.5 mg (12.5 mg Oral Given 10/31/17 1137)  metoprolol tartrate (LOPRESSOR) injection 2.5 mg (2.5 mg Intravenous Given 10/31/17 1230)  sodium chloride 0.9 % bolus 500 mL (500 mLs Intravenous New Bag/Given 10/31/17 1347)     Initial Impression / Assessment and Plan / ED Course  I have reviewed the triage vital signs and the nursing notes.  Pertinent labs & imaging results that were available during my care of the patient were reviewed by me and considered in my medical decision making (see chart for details).     Patient seen and examined. EKG reviewed.   Vital signs reviewed and are as follows: BP (!) 143/92 (BP Location: Right Arm)   Pulse (!) 141   Temp 98.6 F (  37 C) (Oral)   Resp 15   Ht 5\' 4"  (1.626 m)   Wt 83.9 kg (185 lb)   SpO2 98%   BMI 31.76 kg/m   Performed revert maneuver with patient.  We were able to convert her from SVT into normal sinus rhythm at 70 bpm.  Repeat EKG confirms improvement.  I reviewed patient's previous notes.  Will give metoprolol 12.5 mg and observe to see if HR remains stable.    Observed patient R calf cramp with movement when attempting to get her up to the restroom. She has palpable spasms when pain occurs.   ED ECG REPORT   Date: 10/31/2017  Rate: 66  Rhythm: normal sinus rhythm  QRS Axis: normal  Intervals: normal  ST/T Wave abnormalities: normal  Conduction Disutrbances:none  Narrative Interpretation:   Old EKG Reviewed: changes noted, resolution of SVT  I have personally reviewed the EKG tracing and agree with the computerized printout as noted.  2:33 PM Patient went back into SVT after oral metoprolol. IV metoprolol given without improvement.   Pt seen by Dr. Rodena Medin. Patient converted with 6mg  IV  adenosine. She tolerated well.   I have low confidence that she will remain in normal sinus rhythm given her history and events in the emergency department.  She is quite symptomatic when she is in SVT.  Will ask hospitalist for observation admit to help control episodes of SVT and chest pain.  2:46 PM Spoke with Clydie Braun NP [facility] who will see.   Requested I call and request cards consult.   2:52 PM Spoke with Dr. [place]. Cardiology will see.   CRITICAL CARE Performed by: Renne Crigler Total critical care time: 45 minutes Critical care time was exclusive of separately billable procedures and treating other patients. Critical care was necessary to treat or prevent imminent or life-threatening deterioration. Critical care was time spent personally by me on the following activities: development of treatment plan with patient and/or surrogate as well as nursing, discussions with consultants, evaluation of patient's response to treatment, examination of patient, obtaining history from patient or surrogate, ordering and performing treatments and interventions, ordering and review of laboratory studies, ordering and review of radiographic studies, pulse oximetry and re-evaluation of patient's condition.   Final Clinical Impressions(s) / ED Diagnoses   Final diagnoses:  SVT (supraventricular tachycardia) (HCC)  Chest pain, unspecified type   Admit for obs, 72yo with paroxysmal SVT, CP.   ED Discharge Orders    None       Renne Crigler, Cordelia Poche 10/31/17 1452    Wynetta Fines, MD 11/01/17 0645    Renne Crigler, PA-C 11/29/17 0454    Wynetta Fines, MD 11/29/17 [phone]

## 2017-10-31 NOTE — Consult Note (Signed)
Cardiology Consultation:   Patient ID: Jasmine Dawson; [id number]; [birth year]   Admit date: 10/31/2017 Date of Consult: 10/31/2017  Primary Care Provider: Leilani Ableeese, Betti, MD Primary Cardiologist: Lance MussJayadeep Varanasi, MD  Primary Electrophysiologist:  none   Patient Profile:   Jasmine Dawson is a 72 y.o. female with a hx of SVT and HTN who is being seen today for the evaluation of SVT  at the request of Dr Adela Glimpseoutova.  History of Present Illness:   Jasmine Dawson has a longstanding history of paroxysmal SVT. Typically this resolves in a few minutes and responds to Valsalva maneuver. She has been on Bystolic long term. She states recently she has had more SVT and today it would not go away. Came to ED and was found to have SVT rate 138 bpm. Did not break with Metoprolol but did with IV adenosine. SVT did recur. Now in NSR rate 60s. With SVT she feels funny in her chest and states it feels like she is breathing hot air. No chest pain or dyspnea. No syncope. Is frustrated because she is having more tachycardia than before.   Past Medical History:  Diagnosis Date  . Dysrhythmia    occasional PVCs  . Heart palpitations   . Hyperlipidemia   . Hypertension   . Motion sickness   . SVT (supraventricular tachycardia) (HCC)     Past Surgical History:  Procedure Laterality Date  . HYSTEROSCOPY W/D&C  11/07/2011   Procedure: DILATATION AND CURETTAGE /HYSTEROSCOPY;  Surgeon: Antionette CharLisa Jackson-Moore, MD;  Location: [facility];  Service: Gynecology;  Laterality: N/A;  endometrial polypectomy  . TUBAL LIGATION  1977     Home Medications:  Prior to Admission medications   Medication Sig Start Date End Date Taking? Authorizing Provider  BIOTIN PO Take 200 mg by mouth daily.   Yes [provider]  BYSTOLIC 5 MG tablet Take 5 mg by mouth daily. 10/20/17  Yes [provider]  Chlorpheniramine-Acetaminophen (CORICIDIN HBP COLD/FLU PO) Take 1 tablet by mouth as needed.   Yes [provider]  clorazepate (TRANXENE) 7.5 MG tablet Take 7.5 mg by mouth 2 (two) times daily as needed for anxiety.   Yes [provider]  COENZYME Q10 PO Take 300 mg by mouth daily.   Yes [provider]  famotidine (PEPCID) 20 MG tablet Take 1 tablet (20 mg total) by mouth 2 (two) times daily. 05/19/15  Yes Garlon HatchetSanders, Lisa M, PA-C    Inpatient Medications: Scheduled Meds: . enoxaparin (LOVENOX) injection  40 mg Subcutaneous Q24H  . famotidine  20 mg Oral BID  . nebivolol  5 mg Oral Daily   Continuous Infusions:  PRN Meds: acetaminophen **OR** acetaminophen, clorazepate, ondansetron **OR** ondansetron (ZOFRAN) IV, senna-docusate, traZODone  Allergies:    Allergies  Allergen Reactions  . Prednisone Anaphylaxis, Hives and Itching  . Sulfa Antibiotics Anaphylaxis, Hives and Itching  . Caffeine Other (See Comments)    Heart flutters  . Eggs Or Egg-Derived Products Swelling    Throat swelling  . Influenza Vaccines   . Other Hives, Itching, Nausea And Vomiting, Swelling, Rash and Other (See Comments)    High acid foods. Tomatoes, oranges Throat swelling [employer] food  . Shellfish Allergy Itching, Nausea And Vomiting and Rash    Social History:   Social History   Socioeconomic History  . Marital status: Married    Spouse name: Not on file  . Number of children: Not on file  . Years of education: Not on file  .  Highest education level: Not on file  Occupational History  . Not on file  Social Needs  . Financial resource strain: Not on file  . Food insecurity:    Worry: Not on file    Inability: Not on file  . Transportation needs:    Medical: Not on file    Non-medical: Not on file  Tobacco Use  . Smoking status: Never Smoker  . Smokeless tobacco: Never Used  Substance and Sexual Activity  . Alcohol use: No  . Drug use: No  . Sexual activity: Not Currently  Lifestyle  . Physical activity:    Days per week: Not on file    Minutes per session: Not on file  .  Stress: Not on file  Relationships  . Social connections:    Talks on phone: Not on file    Gets together: Not on file    Attends religious service: Not on file    Active member of club or organization: Not on file    Attends meetings of clubs or organizations: Not on file    Relationship status: Not on file  . Intimate partner violence:    Fear of current or ex partner: Not on file    Emotionally abused: Not on file    Physically abused: Not on file    Forced sexual activity: Not on file  Other Topics Concern  . Not on file  Social History Narrative   Did daycare as a younger lady.  Retired at [employer] [phone]              Family History:    Family History  Problem Relation Age of Onset  . Diabetes Mother        died at 7  . Heart attack Mother   . Heart attack Father        died at 54     ROS:  Please see the history of present illness.   All other ROS reviewed and negative.     Physical Exam/Data:   Vitals:   10/31/17 1349 10/31/17 1430 10/31/17 1500 10/31/17 1543  BP: (!) 154/103 (!) 188/64 (!) 153/71   Pulse: (!) 127 (!) 59 60   Resp: 19 12 16    Temp:    98.4 F (36.9 C)  TempSrc:      SpO2: 100% 100% 100%   Weight:      Height:        Intake/Output Summary (Last 24 hours) at 10/31/2017 1710 Last data filed at 10/31/2017 1455 Gross per 24 hour  Intake 500 ml  Output -  Net 500 ml   Filed Weights   10/31/17 1036  Weight: 185 lb (83.9 kg)   Body mass index is 31.76 kg/m.  General:  Well nourished, well developed, in no acute distress HEENT: normal Lymph: no adenopathy Neck: no JVD Endocrine:  No thryomegaly Vascular: No carotid bruits; FA pulses 2+ bilaterally without bruits  Cardiac:  normal S1, S2; RRR; no murmur  Lungs:  clear to auscultation bilaterally, no wheezing, rhonchi or rales  Abd: soft, nontender, no hepatomegaly  Ext: no edema Musculoskeletal:  No deformities, BUE and BLE strength normal and equal Skin: warm  and dry  Neuro:  CNs 2-12 intact, no focal abnormalities noted Psych:  Normal affect   EKG:  The EKG was personally reviewed and demonstrates:  NSR with normal Ecg. Prior Ecg showed SVT rate 138.  Telemetry:  Telemetry was personally reviewed and demonstrates:  NSR  Relevant CV Studies: Echo is pending.  Laboratory Data:  Chemistry Recent Labs  Lab 10/31/17 1114  NA 138  K 4.6  CL 106  CO2 22  GLUCOSE 136*  BUN 19  CREATININE 1.60*  CALCIUM 8.9  GFRNONAA 31*  GFRAA 36*  ANIONGAP 10    No results for input(s): PROT, ALBUMIN, AST, ALT, ALKPHOS, BILITOT in the last 168 hours. Hematology Recent Labs  Lab 10/31/17 1114  WBC 6.9  RBC 4.51  HGB 13.0  HCT 41.7  MCV 92.5  MCH 28.8  MCHC 31.2  RDW 14.5  PLT 269   Cardiac EnzymesNo results for input(s): TROPONINI in the last 168 hours.  Recent Labs  Lab 10/31/17 1127  TROPIPOC 0.04    BNPNo results for input(s): BNP, PROBNP in the last 168 hours.  DDimer No results for input(s): DDIMER in the last 168 hours.  Radiology/Studies:  Dg Chest 2 View  Result Date: 10/31/2017 CLINICAL DATA:  Chest pain.  Tachycardia EXAM: CHEST - 2 VIEW COMPARISON:  Sep 24, 2016 FINDINGS: Lungs are clear. Heart is borderline enlarged with pulmonary vascularity normal. No adenopathy. There is degenerative change in the thoracic spine. IMPRESSION: Borderline cardiac enlargement.  No edema or consolidation. Electronically Signed   By: Bretta Bang III M.D.   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 Increased symptoms recently. Echo pending. I would recommend switching Bystolic to Toprol XL 50 mg daily. If rhythm stable can DC in am. Follow up with Dr. Eldridge Dace. May want to consider referral to EP for consideration of ablation.   For questions or updates, please contact [facility] Please consult [email or web address] for contact info under Cardiology/STEMI.   Signed, Zilda No [place], MD  10/31/2017 5:10 PM

## 2017-10-31 NOTE — ED Notes (Signed)
Admitting MD at bedside.

## 2017-10-31 NOTE — ED Triage Notes (Signed)
Per [facility] pt coming from home c/o right leg burning onset of this am. Pt states on the 4th having center chest pressure that has subsided. Hx of tacycardia. HR 140 denies any chest pain or shortness of breathe.

## 2017-10-31 NOTE — ED Notes (Signed)
Pur Wick placed. 

## 2017-10-31 NOTE — ED Notes (Signed)
Attempted report 

## 2017-10-31 NOTE — H&P (Signed)
History and Physical    Jasmine Dawson [id number] DOB: 12-06-1945 DOA: 10/31/2017  PCP: Leilani Ableeese, Betti, MD Patient coming from: home  Chief Complaint: palpitation  HPI: Jasmine SorrowWilla Mae Dawson is a 72 y.o. female with medical history significant for SVT, hypertension, hyperlipidemia presents to emergency Department chief complaint palpitation shortness of breath chest pain as well as right leg pain. Initial evaluation include ekg revealing svt. [facility] are asked to admit  Information is obtained from the patient and the chart. She states that she has had intermittent palpitations over the last several months. She states typically the episodes are short-lived. This morning she had an episode that lasted longer and also include chest "heaviness" and some dizziness. Associated symptoms also include shortness of breath. She denies headache  syncope or near-syncope. She denies abdominal pain nausea vomiting diarrhea constipation melena bright red blood per rectum. She denies dysuria hematuria frequency or urgency. She denies fever chills lower extremity edema or orthopnea. She also complains of sudden onset of right leg pain with weightbearing. She states that it started in the back of her calf now radiates up her leg to mid thigh. She does not have pain when nonweightbearing. He describes it as an sharp pain with weightbearing. She reports that her blood pressure medicine was changed several weeks ago. Lisinopril and amlodipine was stopped and Bystolic was started. She feels like her symptoms are related to that.    ED Course: in the emergency department she's afebrile heart rate greater than 140. Also have a maneuver performed and a CT converted to normal sinus rhythm at a rate of 70. He then went back and SVT and was provided with metoprolol without improvement. She was then provided with 6 mg IV adenosine. At the time of admission her heart rate is 66 and normal sinus rhythm. She is  pain-free  Review of Systems: As per HPI otherwise all other systems reviewed and are negative.   Ambulatory Status: lives at home with her family is independent with ADLs quite active  Past Medical History:  Diagnosis Date  . Dysrhythmia    occasional PVCs  . Heart palpitations   . Hyperlipidemia   . Hypertension   . Motion sickness   . SVT (supraventricular tachycardia) (HCC)     Past Surgical History:  Procedure Laterality Date  . HYSTEROSCOPY W/D&C  11/07/2011   Procedure: DILATATION AND CURETTAGE /HYSTEROSCOPY;  Surgeon: Antionette CharLisa Jackson-Moore, MD;  Location: [facility];  Service: Gynecology;  Laterality: N/A;  endometrial polypectomy  . TUBAL LIGATION  1977    Social History   Socioeconomic History  . Marital status: Married    Spouse name: Not on file  . Number of children: Not on file  . Years of education: Not on file  . Highest education level: Not on file  Occupational History  . Not on file  Social Needs  . Financial resource strain: Not on file  . Food insecurity:    Worry: Not on file    Inability: Not on file  . Transportation needs:    Medical: Not on file    Non-medical: Not on file  Tobacco Use  . Smoking status: Never Smoker  . Smokeless tobacco: Never Used  Substance and Sexual Activity  . Alcohol use: No  . Drug use: No  . Sexual activity: Not Currently  Lifestyle  . Physical activity:    Days per week: Not on file    Minutes per session: Not on file  . Stress: Not  on file  Relationships  . Social connections:    Talks on phone: Not on file    Gets together: Not on file    Attends religious service: Not on file    Active member of club or organization: Not on file    Attends meetings of clubs or organizations: Not on file    Relationship status: Not on file  . Intimate partner violence:    Fear of current or ex partner: Not on file    Emotionally abused: Not on file    Physically abused: Not on file    Forced sexual activity: Not on file    Other Topics Concern  . Not on file  Social History Narrative   Did daycare as a younger lady.  Retired at [employer] [phone]              Allergies  Allergen Reactions  . Prednisone Anaphylaxis, Hives and Itching  . Sulfa Antibiotics Anaphylaxis, Hives and Itching  . Caffeine Other (See Comments)    Heart flutters  . Eggs Or Egg-Derived Products Swelling    Throat swelling  . Influenza Vaccines   . Other Hives, Itching, Nausea And Vomiting, Swelling, Rash and Other (See Comments)    High acid foods. Tomatoes, oranges Throat swelling [employer] [facility]  . Shellfish Allergy Itching, Nausea And Vomiting and Rash    Family History  Problem Relation Age of Onset  . Diabetes Mother        died at 72  . Heart attack Mother   . Heart attack Father        died at 6    Prior to Admission medications   Medication Sig Start Date End Date Taking? Authorizing Provider  BIOTIN PO Take 200 mg by mouth daily.   Yes [provider]  BYSTOLIC 5 MG tablet Take 5 mg by mouth daily. 10/20/17  Yes [provider]  Chlorpheniramine-Acetaminophen (CORICIDIN HBP COLD/FLU PO) Take 1 tablet by mouth as needed.   Yes [provider]  clorazepate (TRANXENE) 7.5 MG tablet Take 7.5 mg by mouth 2 (two) times daily as needed for anxiety.   Yes [provider]  COENZYME Q10 PO Take 300 mg by mouth daily.   Yes [provider]  famotidine (PEPCID) 20 MG tablet Take 1 tablet (20 mg total) by mouth 2 (two) times daily. 05/19/15  Yes Garlon Hatchet, PA-C    Physical Exam: Vitals:   10/31/17 1304 10/31/17 1349 10/31/17 1430 10/31/17 1500  BP:  (!) 154/103 (!) 188/64 (!) 153/71  Pulse: (!) 128 (!) 127 (!) 59 60  Resp: 17 19 12 16   Temp:      TempSrc:      SpO2: 99% 100% 100% 100%  Weight:      Height:         General:  Appears calm and comfortable lying in bed in no acute distress Eyes:  PERRL, EOMI, normal lids, iris ENT:  grossly normal  hearing, lips & tongue, his membranes of her mouth are pink slightly dry Neck:  no LAD, masses or thyromegaly Cardiovascular:  RRR, no m/r/g. no LE edema.  Respiratory:  CTA bilaterally, no w/r/r. Normal respiratory effort. Abdomen:  soft, ntnd, has a bowel sounds throughout no guarding or rebounding Skin:  no rash or induration seen on limited exam Musculoskeletal:  grossly normal tone BUE/BLE, good ROM, no bony abnormality right leg/knee without erythema or swelling. Mild tenderness to posterior calf to deep  palpation. Full range of motion to the knee Psychiatric:  grossly normal mood and affect, speech fluent and appropriate, AOx3 Neurologic:  CN 2-12 grossly intact, moves all extremities in coordinated fashion, sensation intact  Labs on Admission: I have personally reviewed following labs and imaging studies  CBC: Recent Labs  Lab 10/31/17 1114  WBC 6.9  HGB 13.0  HCT 41.7  MCV 92.5  PLT 269   Basic Metabolic Panel: Recent Labs  Lab 10/31/17 1114  NA 138  K 4.6  CL 106  CO2 22  GLUCOSE 136*  BUN 19  CREATININE 1.60*  CALCIUM 8.9   GFR: Estimated Creatinine Clearance: 32.3 mL/min (A) (by C-G formula based on SCr of 1.6 mg/dL (H)). Liver Function Tests: No results for input(s): AST, ALT, ALKPHOS, BILITOT, PROT, ALBUMIN in the last 168 hours. No results for input(s): LIPASE, AMYLASE in the last 168 hours. No results for input(s): AMMONIA in the last 168 hours. Coagulation Profile: No results for input(s): INR, PROTIME in the last 168 hours. Cardiac Enzymes: No results for input(s): CKTOTAL, CKMB, CKMBINDEX, TROPONINI in the last 168 hours. BNP (last 3 results) No results for input(s): PROBNP in the last 8760 hours. HbA1C: No results for input(s): HGBA1C in the last 72 hours. CBG: No results for input(s): GLUCAP in the last 168 hours. Lipid Profile: No results for input(s): CHOL, HDL, LDLCALC, TRIG, CHOLHDL, LDLDIRECT in the last 72 hours. Thyroid Function  Tests: No results for input(s): TSH, T4TOTAL, FREET4, T3FREE, THYROIDAB in the last 72 hours. Anemia Panel: No results for input(s): VITAMINB12, FOLATE, FERRITIN, TIBC, IRON, RETICCTPCT in the last 72 hours. Urine analysis:    Component Value Date/Time   COLORURINE STRAW (A) 09/24/2016 1212   APPEARANCEUR CLEAR 09/24/2016 1212   LABSPEC 1.015 09/24/2016 1212   PHURINE 8.0 09/24/2016 1212   GLUCOSEU NEGATIVE 09/24/2016 1212   HGBUR NEGATIVE 09/24/2016 1212   BILIRUBINUR NEGATIVE 09/24/2016 1212   KETONESUR NEGATIVE 09/24/2016 1212   PROTEINUR NEGATIVE 09/24/2016 1212   UROBILINOGEN 0.2 09/10/2011 2141   NITRITE NEGATIVE 09/24/2016 1212   LEUKOCYTESUR NEGATIVE 09/24/2016 1212    Creatinine Clearance: Estimated Creatinine Clearance: 32.3 mL/min (A) (by C-G formula based on SCr of 1.6 mg/dL (H)).  Sepsis Labs: @LABRCNTIP (procalcitonin:4,lacticidven:4) )No results found for this or any previous visit (from the past 240 hour(s)).   Radiological Exams on Admission: Dg Chest 2 View  Result Date: 10/31/2017 CLINICAL DATA:  Chest pain.  Tachycardia EXAM: CHEST - 2 VIEW COMPARISON:  Sep 24, 2016 FINDINGS: Lungs are clear. Heart is borderline enlarged with pulmonary vascularity normal. No adenopathy. There is degenerative change in the thoracic spine. IMPRESSION: Borderline cardiac enlargement.  No edema or consolidation. Electronically Signed   By: Bretta Bang III M.D.   On: 10/31/2017 15:19    EKG: Independently reviewed. Junctional tachycardia Right axis deviation Probable lateral infarct, age indeterminate  Assessment/Plan Principal Problem:   SVT (supraventricular tachycardia) (HCC) Active Problems:   Chest pain   HTN (hypertension)   CRI (chronic renal insufficiency), stage 3 (moderate) (HCC)   Right leg pain   Palpitations   #1. Palpitations/SVT/chest pain. Patient with a history of same. ekg as noted above. Initial troponin 0.04. He is provided with the  adenosine in  the emergency department. At this time of admission normal sinus rhythm. -Admit to telemetry for observation -TSH -Echocardiogram -cycle troponin, serial ekg -continue home meds -await cards recommendation  2. Leg pain. No history of DVT but some concern for same and she  has been sedentary. -doppler rule out DVT  3. Hypertension. Fair control. Home medications include Bystolic -continue home meds -monitor -see #1  4. Chronic kidney disease stage III. creatinine 1.6. This appears to be close to baseline.  -monitor urine output -Hold nephrotoxins -Monitor   DVT prophylaxis: lovenox  Code Status: full  Family Communication: son at bedside  Disposition Plan: home  Consults called: jordon cards  Admission status: obs    Gwenyth Bender MD [facility]  If 7PM-7AM, please contact night-coverage [email or web address] Password [facility]  10/31/2017, 3:39 PM

## 2017-10-31 NOTE — ED Notes (Signed)
PA notified of patients heart rate change.

## 2017-10-31 NOTE — ED Triage Notes (Signed)
Error in charting.

## 2017-11-01 ENCOUNTER — Observation Stay (HOSPITAL_BASED_OUTPATIENT_CLINIC_OR_DEPARTMENT_OTHER): Payer: Medicare Other

## 2017-11-01 DIAGNOSIS — N183 Chronic kidney disease, stage 3 (moderate): Secondary | ICD-10-CM | POA: Diagnosis not present

## 2017-11-01 DIAGNOSIS — I361 Nonrheumatic tricuspid (valve) insufficiency: Secondary | ICD-10-CM

## 2017-11-01 DIAGNOSIS — R002 Palpitations: Secondary | ICD-10-CM | POA: Diagnosis not present

## 2017-11-01 DIAGNOSIS — M79609 Pain in unspecified limb: Secondary | ICD-10-CM

## 2017-11-01 DIAGNOSIS — I471 Supraventricular tachycardia: Secondary | ICD-10-CM | POA: Diagnosis not present

## 2017-11-01 DIAGNOSIS — I10 Essential (primary) hypertension: Secondary | ICD-10-CM | POA: Diagnosis not present

## 2017-11-01 DIAGNOSIS — M79604 Pain in right leg: Secondary | ICD-10-CM | POA: Diagnosis not present

## 2017-11-01 LAB — ECHOCARDIOGRAM COMPLETE
Height: 64 in
WEIGHTICAEL: 3030.4 [oz_av]

## 2017-11-01 LAB — BASIC METABOLIC PANEL
ANION GAP: 10 (ref 5–15)
BUN: 19 mg/dL (ref 8–23)
CALCIUM: 8.7 mg/dL — AB (ref 8.9–10.3)
CO2: 22 mmol/L (ref 22–32)
CREATININE: 1.54 mg/dL — AB (ref 0.44–1.00)
Chloride: 109 mmol/L (ref 98–111)
GFR calc non Af Amer: 32 mL/min — ABNORMAL LOW (ref >60)
GFR, EST AFRICAN AMERICAN: 37 mL/min — AB (ref >60)
Glucose, Bld: 105 mg/dL — ABNORMAL HIGH (ref 70–99)
Potassium: 4.1 mmol/L (ref 3.5–5.1)
SODIUM: 141 mmol/L (ref 135–145)

## 2017-11-01 LAB — CBC
HCT: 37.3 % (ref 36.0–46.0)
Hemoglobin: 11.8 g/dL — ABNORMAL LOW (ref 12.0–15.0)
MCH: 29.1 pg (ref 26.0–34.0)
MCHC: 31.6 g/dL (ref 30.0–36.0)
MCV: 92.1 fL (ref 78.0–100.0)
PLATELETS: 229 10*3/uL (ref 150–400)
RBC: 4.05 MIL/uL (ref 3.87–5.11)
RDW: 14.6 % (ref 11.5–15.5)
WBC: 8.9 10*3/uL (ref 4.0–10.5)

## 2017-11-01 LAB — TROPONIN I: Troponin I: 0.05 ng/mL (ref <0.03)

## 2017-11-01 MED ORDER — METOPROLOL SUCCINATE ER 50 MG PO TB24: 50.0000 mg | ORAL_TABLET | Freq: Every day | ORAL | 0 refills | Status: DC

## 2017-11-01 NOTE — Progress Notes (Signed)
Right lower extremity venous duplex completed. There is no evidence of a DVT or Baker's cyst. Toma DeitersVirginia Terriona Horlacher, RVS 11/01/2017 2:57 PM

## 2017-11-01 NOTE — Progress Notes (Signed)
Progress Note  Patient Name: Jasmine Dawson Date of Encounter: 11/01/2017  Primary Cardiologist: Lance MussJayadeep Varanasi, MD   Subjective   No more palpitations, but complains of right lower extremity pain when she walks.  Inpatient Medications    Scheduled Meds: . enoxaparin (LOVENOX) injection  40 mg Subcutaneous Q24H  . famotidine  20 mg Oral BID  . iopamidol      . metoprolol succinate  50 mg Oral Daily   Continuous Infusions:  PRN Meds: acetaminophen **OR** acetaminophen, clorazepate, ondansetron **OR** ondansetron (ZOFRAN) IV, senna-docusate, traZODone   Vital Signs    Vitals:   10/31/17 1543 10/31/17 2149 11/01/17 0547 11/01/17 0700  BP:  (!) 153/75 (!) 152/65   Pulse:  (!) 55 (!) 57   Resp:  18 (!) 23 16  Temp: 98.4 F (36.9 C) 98 F (36.7 C) 98.2 F (36.8 C)   TempSrc:  Oral Oral   SpO2:  97% 100%   Weight:   189 lb 6.4 oz (85.9 kg)   Height:        Intake/Output Summary (Last 24 hours) at 11/01/2017 0917 Last data filed at 10/31/2017 1455 Gross per 24 hour  Intake 500 ml  Output -  Net 500 ml   Filed Weights   10/31/17 1036 11/01/17 0547  Weight: 185 lb (83.9 kg) 189 lb 6.4 oz (85.9 kg)    Telemetry    SR - Personally Reviewed  ECG    SR - Personally Reviewed  Physical Exam   GEN: No acute distress.   Neck: No JVD Cardiac: RRR, no murmurs, rubs, or gallops.  Respiratory: Clear to auscultation bilaterally. GI: Soft, nontender, non-distended  MS: No edema; No deformity. Neuro:  Nonfocal  Psych: Normal affect   Labs    Chemistry Recent Labs  Lab 10/31/17 1114 11/01/17 0246  NA 138 141  K 4.6 4.1  CL 106 109  CO2 22 22  GLUCOSE 136* 105*  BUN 19 19  CREATININE 1.60* 1.54*  CALCIUM 8.9 8.7*  GFRNONAA 31* 32*  GFRAA 36* 37*  ANIONGAP 10 10     Hematology Recent Labs  Lab 10/31/17 1114 11/01/17 0246  WBC 6.9 8.9  RBC 4.51 4.05  HGB 13.0 11.8*  HCT 41.7 37.3  MCV 92.5 92.1  MCH 28.8 29.1  MCHC 31.2 31.6  RDW 14.5  14.6  PLT 269 229    Cardiac Enzymes Recent Labs  Lab 10/31/17 0114 10/31/17 1703 11/01/17 0246  TROPONINI 0.04* 0.06* 0.05*    Recent Labs  Lab 10/31/17 1127  TROPIPOC 0.04     BNPNo results for input(s): BNP, PROBNP in the last 168 hours.   DDimer  Recent Labs  Lab 10/31/17 0114  DDIMER 9.18*     Radiology    Dg Chest 2 View  Result Date: 10/31/2017 CLINICAL DATA:  Chest pain.  Tachycardia EXAM: CHEST - 2 VIEW COMPARISON:  Sep 24, 2016 FINDINGS: Lungs are clear. Heart is borderline enlarged with pulmonary vascularity normal. No adenopathy. There is degenerative change in the thoracic spine. IMPRESSION: Borderline cardiac enlargement.  No edema or consolidation. Electronically Signed   By: Bretta BangWilliam  Woodruff III M.D.   On: 10/31/2017 15:19   Ct Angio Chest Pe W Or Wo Contrast  Result Date: 10/31/2017 CLINICAL DATA:  Chest pain with elevated D-dimer EXAM: CT ANGIOGRAPHY CHEST WITH CONTRAST TECHNIQUE: Multidetector CT imaging of the chest was performed using the standard protocol during bolus administration of intravenous contrast. Multiplanar CT image reconstructions and MIPs  were obtained to evaluate the vascular anatomy. CONTRAST:  ISOVUE-370 IOPAMIDOL (ISOVUE-370) INJECTION 76% COMPARISON:  Chest x-ray 10/31/2017, CT abdomen pelvis 09/24/2016, CT 05/18/2015 FINDINGS: Cardiovascular: Satisfactory opacification of the pulmonary arteries to the segmental level. No evidence of pulmonary embolism. Mild cardiomegaly. Trace pericardial effusion. Mild aortic atherosclerosis. No aneurysmal dilatation. Scattered coronary artery calcification. Mediastinum/Nodes: Midline trachea. No thyroid mass. No significant adenopathy. Small hiatal hernia. Lungs/Pleura: Lungs are clear. No pleural effusion or pneumothorax. Upper Abdomen: No acute abnormality. Musculoskeletal: Bones appear diffusely sclerotic but without acute change. Review of the MIP images confirms the above findings. IMPRESSION:  1. Negative for acute pulmonary embolus. 2. Mild cardiomegaly with trace pleural effusion 3. Clear lung fields Aortic Atherosclerosis (ICD10-I70.0). Electronically Signed   By: Jasmine Pang M.D.   On: 10/31/2017 23:15    Cardiac Studies   Echo is pending  Patient Profile     72 y.o. female   Assessment & Plan    1. 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 Increased symptoms recently. Echo pending. I would recommend switching Bystolic to Toprol XL 50 mg daily. No further episodes of SVT. The patient can be charge, we will arrange a follow up with Dr. Eldridge Dace. May want to consider referral to EP for consideration of ablation.  2. RLE pain - workup per primary team  [facility] will sign off.   Medication Recommendations:  Toprol XL 50 mg po daily Other recommendations (labs, testing, etc): N/A Follow up as an outpatient:  With Dr Eldridge Dace and with EP for consideration of AVNRT ablation  For questions or updates, please contact [facility] Please consult [email or web address] for contact info under Cardiology/STEMI.     Signed, Tobias Alexander, MD  11/01/2017, 9:17 AM

## 2017-11-01 NOTE — Discharge Summary (Signed)
Physician Discharge Summary  Ellison Leisure ZOX:[id number] DOB: 1945/06/25 DOA: 10/31/2017  PCP: Leilani Able, MD  Admit date: 10/31/2017 Discharge date: 11/01/2017  Admitted From: Home  Disposition:  Home   Recommendations for Outpatient Follow-up and new medication changes:  1. Follow up with Dr. Pecola Leisure in 7 days 2. Follow up with Dr. Eldridge Dace will be arranaged per cardiology clinic.  3. Bystolic has been changed to Metoprolol succinate 50 mg po daily.  4. Pending echocardiogram and dopplers lower extremities.   Home Health: no   Equipment/Devices: no    Discharge Condition: stable  CODE STATUS: full  Diet recommendation: heart healthy.   Brief/Interim Summary: 72 year old female who presented with palpitations.  She does have a significant past medical history for supraventricular tachycardia, hypertension, and dyslipidemia.  She complained of intermittent palpitations over the last several months, transitory and brief in nature, but on the day of admission her symptoms were more persistent and severe, associated with dyspnea, chest pressure and dizziness.  On her initial physical examination blood pressure 154/103, heart rate 128, respiratory rate 17, saturation 100%.  Moist mucous membranes, lungs clear to auscultation, heart S1-S2 present tachycardic, abdomen soft nontender, no lower extremity edema.  Sodium 138, potassium 4.6, chloride 106, bicarb 22, glucose 136, BUN 19, creatinine 1.6 troponin I 0.04, white count 6.9, hemoglobin 13.0, hematocrit 41.7, platelets 269, TSH 1.7, chest x-ray with increased lung markings, CT chest negative for pulmonary embolism.  EKG with supraventricular tachycardia, (AV nodal reentry tachycardia) rate of 138 bpm, normal axis.   Patient was admitted to the hospital with working diagnosis of symptomatic supraventricular tachycardia.  1.  AV node reentry tachycardia.  Patient was admitted to the medical ward, he was placed on a remote telemetry monitor,  received adenosine in the emergency department.  He returned to normal sinus rhythm, patient was placed on metoprolol succinate 50 mg with good toleration.  Discharge heart rate 54, sinus rhythm.  Patient will follow up with the cardiology clinic, to consider further electrophysiology evaluation for an ablation.  2.  Chronic kidney disease stage III.  Kidney function remained stable, charge creatinine 1.54, potassium 4.1, serum bicarbonate 22.  3.  Hypertension.  Bystolic was exchanged to metoprolol with good toleration.  4.  Right leg pain.  Pending lower extremity Dopplers.  Discharge Diagnoses:  Principal Problem:   SVT (supraventricular tachycardia) (HCC) Active Problems:   HTN (hypertension)   CRI (chronic renal insufficiency), stage 3 (moderate) (HCC)   Chest pain   Palpitations   Right leg pain   Elevated troponin    Discharge Instructions   Allergies as of 11/01/2017      Reactions   Prednisone Anaphylaxis, Hives, Itching   Sulfa Antibiotics Anaphylaxis, Hives, Itching   Caffeine Other (See Comments)   Heart flutters   Eggs Or Egg-derived Products Swelling   Throat swelling   Influenza Vaccines    Other Hives, Itching, Nausea And Vomiting, Swelling, Rash, Other (See Comments)   High acid foods. Tomatoes, oranges Throat swelling [place] food   Shellfish Allergy Itching, Nausea And Vomiting, Rash      Medication List    STOP taking these medications   BYSTOLIC 5 MG tablet Generic drug:  nebivolol     TAKE these medications   BIOTIN PO Take 200 mg by mouth daily.   clorazepate 7.5 MG tablet Commonly known as:  TRANXENE Take 7.5 mg by mouth 2 (two) times daily as needed for anxiety.   COENZYME Q10 PO Take 300 mg  by mouth daily.   CORICIDIN HBP COLD/FLU PO Take 1 tablet by mouth as needed.   famotidine 20 MG tablet Commonly known as:  PEPCID Take 1 tablet (20 mg total) by mouth 2 (two) times daily.   metoprolol succinate 50 MG 24 hr tablet Commonly  known as:  TOPROL-XL Take 1 tablet (50 mg total) by mouth daily. Take with or immediately following a meal. Start taking on:  11/02/2017       Allergies  Allergen Reactions  . Prednisone Anaphylaxis, Hives and Itching  . Sulfa Antibiotics Anaphylaxis, Hives and Itching  . Caffeine Other (See Comments)    Heart flutters  . Eggs Or Egg-Derived Products Swelling    Throat swelling  . Influenza Vaccines   . Other Hives, Itching, Nausea And Vomiting, Swelling, Rash and Other (See Comments)    High acid foods. Tomatoes, oranges Throat swelling [employer] food  . Shellfish Allergy Itching, Nausea And Vomiting and Rash    Consultations: Cardiology    Procedures/Studies: Dg Chest 2 View  Result Date: 10/31/2017 CLINICAL DATA:  Chest pain.  Tachycardia EXAM: CHEST - 2 VIEW COMPARISON:  Sep 24, 2016 FINDINGS: Lungs are clear. Heart is borderline enlarged with pulmonary vascularity normal. No adenopathy. There is degenerative change in the thoracic spine. IMPRESSION: Borderline cardiac enlargement.  No edema or consolidation. Electronically Signed   By: Bretta BangWilliam  Woodruff III M.D.   On: 10/31/2017 15:19   Ct Angio Chest Pe W Or Wo Contrast  Result Date: 10/31/2017 CLINICAL DATA:  Chest pain with elevated D-dimer EXAM: CT ANGIOGRAPHY CHEST WITH CONTRAST TECHNIQUE: Multidetector CT imaging of the chest was performed using the standard protocol during bolus administration of intravenous contrast. Multiplanar CT image reconstructions and MIPs were obtained to evaluate the vascular anatomy. CONTRAST:  100mL ISOVUE-370 IOPAMIDOL (ISOVUE-370) INJECTION 76% COMPARISON:  Chest x-ray 10/31/2017, CT abdomen pelvis 09/24/2016, CT 05/18/2015 FINDINGS: Cardiovascular: Satisfactory opacification of the pulmonary arteries to the segmental level. No evidence of pulmonary embolism. Mild cardiomegaly. Trace pericardial effusion. Mild aortic atherosclerosis. No aneurysmal dilatation. Scattered coronary artery calcification.  Mediastinum/Nodes: Midline trachea. No thyroid mass. No significant adenopathy. Small hiatal hernia. Lungs/Pleura: Lungs are clear. No pleural effusion or pneumothorax. Upper Abdomen: No acute abnormality. Musculoskeletal: Bones appear diffusely sclerotic but without acute change. Review of the MIP images confirms the above findings. IMPRESSION: 1. Negative for acute pulmonary embolus. 2. Mild cardiomegaly with trace pleural effusion 3. Clear lung fields Aortic Atherosclerosis (ICD10-I70.0). Electronically Signed   By: Jasmine PangKim  Fujinaga M.D.   On: 10/31/2017 23:15       Subjective: Patient feeling well, no nausea or vomiting, no chest pain or dyspnea, improved right lower extremity pain.   Discharge Exam: Vitals:   11/01/17 0547 11/01/17 0700  BP: (!) 152/65   Pulse: (!) 57   Resp: (!) 23 16  Temp: 98.2 F (36.8 C)   SpO2: 100%    Vitals:   10/31/17 1543 10/31/17 2149 11/01/17 0547 11/01/17 0700  BP:  (!) 153/75 (!) 152/65   Pulse:  (!) 55 (!) 57   Resp:  18 (!) 23 16  Temp: 98.4 F (36.9 C) 98 F (36.7 C) 98.2 F (36.8 C)   TempSrc:  Oral Oral   SpO2:  97% 100%   Weight:   85.9 kg (189 lb 6.4 oz)   Height:        General: Not in pain or dyspnea  Neurology: Awake and alert, non focal  E ENT: no pallor, no icterus, oral  mucosa moist Cardiovascular: No JVD. S1-S2 present, rhythmic, no gallops, rubs, or murmurs. No significant lower extremity edema. Pulmonary: vesicular breath sounds bilaterally, adequate air movement, no wheezing, rhonchi or rales. Gastrointestinal. Abdomen with no organomegaly, non tender, no rebound or guarding Skin. No rashes Musculoskeletal: no joint deformities   The results of significant diagnostics from this hospitalization (including imaging, microbiology, ancillary and laboratory) are listed below for reference.     Microbiology: No results found for this or any previous visit (from the past 240 hour(s)).   Labs: BNP (last 3 results) No results  for input(s): BNP in the last 8760 hours. Basic Metabolic Panel: Recent Labs  Lab 10/31/17 1114 11/01/17 0246  NA 138 141  K 4.6 4.1  CL 106 109  CO2 22 22  GLUCOSE 136* 105*  BUN 19 19  CREATININE 1.60* 1.54*  CALCIUM 8.9 8.7*   Liver Function Tests: No results for input(s): AST, ALT, ALKPHOS, BILITOT, PROT, ALBUMIN in the last 168 hours. No results for input(s): LIPASE, AMYLASE in the last 168 hours. No results for input(s): AMMONIA in the last 168 hours. CBC: Recent Labs  Lab 10/31/17 1114 11/01/17 0246  WBC 6.9 8.9  HGB 13.0 11.8*  HCT 41.7 37.3  MCV 92.5 92.1  PLT 269 229   Cardiac Enzymes: Recent Labs  Lab 10/31/17 0114 10/31/17 1703 11/01/17 0246  TROPONINI 0.04* 0.06* 0.05*   BNP: Invalid input(s): POCBNP CBG: No results for input(s): GLUCAP in the last 168 hours. D-Dimer Recent Labs    10/31/17 0114  DDIMER 9.18*   Hgb A1c No results for input(s): HGBA1C in the last 72 hours. Lipid Profile No results for input(s): CHOL, HDL, LDLCALC, TRIG, CHOLHDL, LDLDIRECT in the last 72 hours. Thyroid function studies Recent Labs    10/31/17 1703  TSH 1.728   Anemia work up No results for input(s): VITAMINB12, FOLATE, FERRITIN, TIBC, IRON, RETICCTPCT in the last 72 hours. Urinalysis    Component Value Date/Time   COLORURINE STRAW (A) 09/24/2016 1212   APPEARANCEUR CLEAR 09/24/2016 1212   LABSPEC 1.015 09/24/2016 1212   PHURINE 8.0 09/24/2016 1212   GLUCOSEU NEGATIVE 09/24/2016 1212   HGBUR NEGATIVE 09/24/2016 1212   BILIRUBINUR NEGATIVE 09/24/2016 1212   KETONESUR NEGATIVE 09/24/2016 1212   PROTEINUR NEGATIVE 09/24/2016 1212   UROBILINOGEN 0.2 09/10/2011 2141   NITRITE NEGATIVE 09/24/2016 1212   LEUKOCYTESUR NEGATIVE 09/24/2016 1212   Sepsis Labs Invalid input(s): PROCALCITONIN,  WBC,  LACTICIDVEN Microbiology No results found for this or any previous visit (from the past 240 hour(s)).   Time coordinating discharge: 45  minutes  SIGNED:   Coralie Keens, MD  [facility] 11/01/2017, 12:36 PM Pager [phone]  If 7PM-7AM, please contact night-coverage [email or web address] Password [email or web address]

## 2017-11-01 NOTE — Progress Notes (Signed)
  Echocardiogram 2D Echocardiogram has been performed.  Jasmine ChimesWendy  Harvis Dawson 11/01/2017, 1:45 PM

## 2017-11-26 ENCOUNTER — Institutional Professional Consult (permissible substitution): Payer: Medicare Other | Admitting: Cardiology

## 2017-12-15 ENCOUNTER — Ambulatory Visit: Payer: Medicare Other | Admitting: Physician Assistant

## 2018-02-05 ENCOUNTER — Encounter: Payer: Self-pay | Admitting: Physician Assistant

## 2018-02-24 ENCOUNTER — Ambulatory Visit: Payer: Medicare Other | Admitting: Physician Assistant

## 2018-03-16 ENCOUNTER — Encounter: Payer: Self-pay | Admitting: Physician Assistant

## 2018-03-16 NOTE — Progress Notes (Addendum)
Cardiology Office Note    Date:  03/18/2018  ID:  Jasmine Dawson, DOB 07/07/1945, MRN [id number] PCP:  Leilani Ableeese, Betti, MD  Cardiologist:  Lance MussJayadeep Varanasi, MD   Chief Complaint: f/u SVT  History of Present Illness:  Jasmine Dawson is a 72 y.o. female with history of paroxysmal SVT, occasional PVCs, HTN, HLD, CKD III, mod-severe TR and moderately increased PASP 47mmHg by echo 10/2017 who presents for follow-up.  Jasmine Dawson has a longstanding history of paroxysmal SVT. Typically this resolves in a few minutes and responds to Valsalva maneuver. She had been on Bystolic long term but recently did not feel like this was doing anything for her. In 10/2017 she was readmitted with an episode that persisted. 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 Bystolic was changed to Toprol. 2D echo 11/01/17 showed EF 65-70%, grade 1 DD, mod-severe TR, moderately increased PASP 47mmHg. Last labs 10/2017 Hgb 11.8, Cr 1.54, K 4.1, TSH wnl, troponins negative, d-dimer 9.18. CT angio 10/31/17 had shown no PE, + mild cardiomegaly with trace pleural effusions. Her primary care has since changed Toprol to Lopressor 50mg  BID. She also takes a trimaterene/HCTZ PRN lower extremity edema which appears well controlled today. She has only had one breakthrough episode of SVT since July but does not feel well on the metoprolol. It makes her feel dizzy for a half hour after she takes it. Her BP is running elevated today in clinic today but she reports home values in the 120s systolic. She denies any dyspnea or chest pain. She denies any syncope. Interestingly she does have a strong family history of clotting. 2 of her brothers had a clotting event (details unclear) around age 74, an older sister had a blood clot in her lungs in her 1570s and is on blood thinners, and a niece had a clot while pregnant in her 3320s. She herself has no history of VT. She has no excessive snoring or daytime fatigue.  Past Medical History:    Diagnosis Date  . CKD (chronic kidney disease), stage III (HCC)   . Dysrhythmia    occasional PVCs  . Heart palpitations   . Hyperlipidemia   . Hypertension   . Motion sickness   . Pulmonary hypertension (HCC)    a. 2D echo 11/01/17 showed EF 65-70%, grade 1 DD, mod-severe TR, moderately increased PASP 47mmHg.  [name]. SVT (supraventricular tachycardia) (HCC)   . Tricuspid regurgitation     Past Surgical History:  Procedure Laterality Date  . HYSTEROSCOPY W/D&C  11/07/2011   Procedure: DILATATION AND CURETTAGE /HYSTEROSCOPY;  Surgeon: Antionette CharLisa Jackson-Moore, MD;  Location: [facility];  Service: Gynecology;  Laterality: N/A;  endometrial polypectomy  . TUBAL LIGATION  1977    Current Medications: Current Meds  Medication Sig  . BIOTIN PO Take 200 mg by mouth daily.  . Chlorpheniramine-Acetaminophen (CORICIDIN HBP COLD/FLU PO) Take 1 tablet by mouth as needed.  . clorazepate (TRANXENE) 7.5 MG tablet Take 7.5 mg by mouth 2 (two) times daily as needed for anxiety.  [name]. COENZYME Q10 PO Take 300 mg by mouth daily.  . famotidine (PEPCID) 20 MG tablet Take 1 tablet (20 mg total) by mouth 2 (two) times daily.  . meclizine (ANTIVERT) 25 MG tablet Take 25 mg by mouth 3 (three) times daily as needed for dizziness.  . metoprolol tartrate (LOPRESSOR) 50 MG tablet Take 50 mg by mouth 2 (two) times daily. Take with or immediately following a meal.  . triamterene-hydrochlorothiazide (DYAZIDE) 37.5-25 MG capsule  Take 1 capsule by mouth daily as needed (fluid).      Allergies:   Prednisone; Sulfa antibiotics; Caffeine; Eggs or egg-derived products; Influenza vaccines; Other; and Shellfish allergy   Social History   Socioeconomic History  . Marital status: Married    Spouse name: Not on file  . Number of children: Not on file  . Years of education: Not on file  . Highest education level: Not on file  Occupational History  . Not on file  Social Needs  . Financial resource strain: Not on file  . Food  insecurity:    Worry: Not on file    Inability: Not on file  . Transportation needs:    Medical: Not on file    Non-medical: Not on file  Tobacco Use  . Smoking status: Never Smoker  . Smokeless tobacco: Never Used  Substance and Sexual Activity  . Alcohol use: No  . Drug use: No  . Sexual activity: Not Currently  Lifestyle  . Physical activity:    Days per week: Not on file    Minutes per session: Not on file  . Stress: Not on file  Relationships  . Social connections:    Talks on phone: Not on file    Gets together: Not on file    Attends religious service: Not on file    Active member of club or organization: Not on file    Attends meetings of clubs or organizations: Not on file    Relationship status: Not on file  Other Topics Concern  . Not on file  Social History Narrative   Did daycare as a younger lady.  Retired at [employer] [phone]               Family History:  The patient's family history includes Diabetes in her mother; Heart attack in her father and mother.  ROS:   Please see the history of present illness. All other systems are reviewed and otherwise negative.    PHYSICAL EXAM:   VS:  BP (!) 144/76   Pulse 65   Ht 5\' 3"  (1.6 m)   Wt 192 lb (87.1 kg)   SpO2 97%   BMI 34.01 kg/m   BMI: Body mass index is 34.01 kg/m. GEN: Well nourished, well developed obese W, in no acute distress HEENT: normocephalic, atraumatic Neck: no JVD, carotid bruits, or masses Cardiac: RRR; no murmurs, rubs, or gallops, no edema  Respiratory:  clear to auscultation bilaterally, normal work of breathing GI: soft, nontender, nondistended, + BS MS: no deformity or atrophy Skin: warm and dry, no rash Neuro:  Alert and Oriented x 3, Strength and sensation are intact, follows commands Psych: euthymic mood, full affect  Wt Readings from Last 3 Encounters:  03/18/18 192 lb (87.1 kg)  11/01/17 189 lb 6.4 oz (85.9 kg)  10/02/16 188 lb 12.8 oz (85.6 kg)        Studies/Labs Reviewed:   EKG:  EKG was ordered today and personally reviewed by me and demonstrates NSR 65bpm, S wave in lead I (but shorter than R wave), Q wave lead III, nonspecific TW changes in lead III.  Recent Labs: 10/31/2017: TSH 1.728 11/01/2017: BUN 19; Creatinine, Ser 1.54; Hemoglobin 11.8; Platelets 229; Potassium 4.1; Sodium 141   Lipid Panel No results found for: CHOL, TRIG, HDL, CHOLHDL, VLDL, LDLCALC, LDLDIRECT  Additional studies/ records that were reviewed today include: Summarized above.   ASSESSMENT & PLAN:   1. Paroxysmal SVT -  relatively controlled with BB but she is not tolerating metoprolol 50mg  BID well due to dizziness. She had breakthrough SVT with Toprol. Will change to atenolol 50mg  daily. I did tell her she could split up this up into 2 doses or reduce to 25mg  daily if she finds the same effect. We did discuss ablation as a potential option for management but she is hesitant to proceed. She will continue to think about it and let us know what she decides. 2. Tricuspid regurgitation and moderately elevated PA pressure concerning for pulmonary HTN - unclear etiology. I discussed with DOD Dr. Delton See who saw patient in the hospital. She agrees this warrants further evaluation. We would be concerned about CTEPH given her family history of clotting and unusually high d-dimer in 10/2017 without otherwise obvious signs of DVT or PE. Will obtain VQ scan to evaluate for chronic PE. She will need an updated CXR beforehand. She denies any history of lung issues whatsoever. We walked her in clinic and she desatted to 91% with rapid ambulation but was asymptomatic. Resting O2 sat was 97-99%. Will also repeat echo to trend TR and pulm pressures. If etiology remains elusive may need to complete eval with pulm workup and sleep study. I would also like to repeat a d-dimer. I suspect this will be elevated and no acute action is necessary since she is asymptomatic but I would like to  trend. Will obtain hypercoagulable workup as well given strong family history of clotting. 3. Essential HTN - follow BP with med change. 4. Dizziness - will update labs today to exclude obvious metabolic abnormality.  Disposition: F/u with Dr. Eldridge Dace in 3 months.  Medication Adjustments/Labs and Tests Ordered: Current medicines are reviewed at length with the patient today.  Concerns regarding medicines are outlined above. Medication changes, Labs and Tests ordered today are summarized above and listed in the Patient Instructions accessible in Encounters.   Signed, Laurann Montana, PA-C  03/18/2018 4:24 PM    [facility] [address] [place], [place], [place]  [address] Phone: [phone]; Fax: [phone]

## 2018-03-18 ENCOUNTER — Ambulatory Visit (INDEPENDENT_AMBULATORY_CARE_PROVIDER_SITE_OTHER): Payer: Medicare Other | Admitting: Physician Assistant

## 2018-03-18 ENCOUNTER — Encounter: Payer: Self-pay | Admitting: Physician Assistant

## 2018-03-18 VITALS — BP 144/76 | HR 65 | Ht 63.0 in | Wt 192.0 lb

## 2018-03-18 DIAGNOSIS — E785 Hyperlipidemia, unspecified: Secondary | ICD-10-CM

## 2018-03-18 DIAGNOSIS — I471 Supraventricular tachycardia: Secondary | ICD-10-CM | POA: Diagnosis not present

## 2018-03-18 DIAGNOSIS — I071 Rheumatic tricuspid insufficiency: Secondary | ICD-10-CM | POA: Diagnosis not present

## 2018-03-18 DIAGNOSIS — I10 Essential (primary) hypertension: Secondary | ICD-10-CM | POA: Diagnosis not present

## 2018-03-18 DIAGNOSIS — R42 Dizziness and giddiness: Secondary | ICD-10-CM

## 2018-03-18 DIAGNOSIS — Z832 Family history of diseases of the blood and blood-forming organs and certain disorders involving the immune mechanism: Secondary | ICD-10-CM

## 2018-03-18 DIAGNOSIS — I272 Pulmonary hypertension, unspecified: Secondary | ICD-10-CM | POA: Diagnosis not present

## 2018-03-18 DIAGNOSIS — R7989 Other specified abnormal findings of blood chemistry: Secondary | ICD-10-CM

## 2018-03-18 MED ORDER — ATENOLOL 50 MG PO TABS: 50.0000 mg | ORAL_TABLET | Freq: Two times a day (BID) | ORAL | 3 refills | Status: DC

## 2018-03-18 NOTE — Patient Instructions (Signed)
Medication Instructions:  1. STOP METOPROLOL   2. START ATENOLOL 50 MG DAILY; RX HAS BEEN SENT IN  If you need a refill on your cardiac medications before your next appointment, please call your pharmacy.   Lab work: 1. TODAY BMET, CBC, TSH, D-DIMER   2. YOU WILL NEED TO HAVE LAB WORK DONE AT [place] [facility] WHEN YOU GO TO HAVE YOUR CHEST X-RAY AND VQ SCAN If you have labs (blood work) drawn today and your tests are completely normal, you will receive your results only by: [name]. MyChart Message (if you have MyChart) OR . A paper copy in the mail If you have any lab test that is abnormal or we need to change your treatment, we will call you to review the results.  Testing/Procedures: 1. VQ [facility]  2. CHE[facility] BE DONE AT [facility] [place]   3. Your physician has requested that you have an echocardiogram. Echocardiography is a painless test that uses sound waves to create images of your heart. It provides your doctor with information about the size and shape of your heart and how well your heart's chambers and valves are working. This procedure takes approximately one hour. There are no restrictions for this procedure.   Follow-Up: At [facility], you and your health needs are our priority.  As part of our continuing mission to provide you with exceptional heart care, we have created designated Provider Care Teams.  These Care Teams include your primary Cardiologist (physician) and Advanced Practice Providers (APPs -  Physician Assistants and Nurse Practitioners) who all work together to provide you with the care you need, when you need it. You will need a follow up appointment in 3 months. You may see Lance MussJayadeep Varanasi, MD or one of the following Advanced Practice Providers on your designated Care Team:   BroussardBrittainy Simmons, PA-C Ronie Spiesayna Dunn, PA-C . Jacolyn ReedyMichele Lenze, PA-C  Any Other Special Instructions Will Be Listed Below (If Applicable).

## 2018-03-19 ENCOUNTER — Telehealth: Payer: Self-pay | Admitting: Physician Assistant

## 2018-03-19 ENCOUNTER — Telehealth: Payer: Self-pay | Admitting: *Deleted

## 2018-03-19 DIAGNOSIS — Z832 Family history of diseases of the blood and blood-forming organs and certain disorders involving the immune mechanism: Secondary | ICD-10-CM

## 2018-03-19 LAB — CBC
HEMATOCRIT: 40.7 % (ref 34.0–46.6)
Hemoglobin: 13.6 g/dL (ref 11.1–15.9)
MCH: 29.8 pg (ref 26.6–33.0)
MCHC: 33.4 g/dL (ref 31.5–35.7)
MCV: 89 fL (ref 79–97)
PLATELETS: 308 10*3/uL (ref 150–450)
RBC: 4.57 x10E6/uL (ref 3.77–5.28)
RDW: 13.7 % (ref 12.3–15.4)
WBC: 7.3 10*3/uL (ref 3.4–10.8)

## 2018-03-19 LAB — BASIC METABOLIC PANEL
BUN / CREAT RATIO: 16 (ref 12–28)
BUN: 26 mg/dL (ref 8–27)
CHLORIDE: 102 mmol/L (ref 96–106)
CO2: 23 mmol/L (ref 20–29)
Calcium: 9.5 mg/dL (ref 8.7–10.3)
Creatinine, Ser: 1.65 mg/dL — ABNORMAL HIGH (ref 0.57–1.00)
GFR calc Af Amer: 35 mL/min/{1.73_m2} — ABNORMAL LOW (ref >59)
GFR calc non Af Amer: 30 mL/min/{1.73_m2} — ABNORMAL LOW (ref >59)
GLUCOSE: 87 mg/dL (ref 65–99)
Potassium: 4.9 mmol/L (ref 3.5–5.2)
Sodium: 140 mmol/L (ref 134–144)

## 2018-03-19 LAB — D-DIMER, QUANTITATIVE: D-DIMER: 1.03 mg/L FEU — ABNORMAL HIGH (ref 0.00–0.49)

## 2018-03-19 LAB — TSH: TSH: 2 u[IU]/mL (ref 0.450–4.500)

## 2018-03-19 MED ORDER — ATENOLOL 50 MG PO TABS: 50.0000 mg | ORAL_TABLET | Freq: Every day | ORAL | 3 refills | Status: DC

## 2018-03-19 NOTE — Telephone Encounter (Signed)
See phone note from today.

## 2018-03-19 NOTE — Telephone Encounter (Signed)
Okey RegalCarol - can you take a look at atenolol rx. Should be 50mg  daily not BID, and should be listed under my name not Lenze. Please call pt and pharmacy. Thanks. Kriste Broman PA-C

## 2018-03-19 NOTE — Telephone Encounter (Signed)
Pt has been notified of results. I called the pt to let her know that the Rx directions on Atenolol said 50 mg twice daily. I advised this is incorrect and she is to take Atenolol 50 mg once a day. I have corrected her medication list and have sent in a new Rx as well. Pt thanked me for the call and will make note of this.

## 2018-03-22 ENCOUNTER — Other Ambulatory Visit: Payer: Self-pay

## 2018-03-22 ENCOUNTER — Other Ambulatory Visit (HOSPITAL_COMMUNITY)
Admission: RE | Admit: 2018-03-22 | Discharge: 2018-03-22 | Disposition: A | Discharge status: Home / Self Care | Payer: Medicare Other | Source: Ambulatory Visit

## 2018-03-22 ENCOUNTER — Ambulatory Visit (HOSPITAL_COMMUNITY)
Admission: RE | Admit: 2018-03-22 | Discharge: 2018-03-22 | Disposition: A | Discharge status: Home / Self Care | Payer: Medicare Other | Source: Ambulatory Visit | Attending: Physician Assistant | Admitting: Physician Assistant

## 2018-03-22 ENCOUNTER — Ambulatory Visit (HOSPITAL_BASED_OUTPATIENT_CLINIC_OR_DEPARTMENT_OTHER): Payer: Medicare Other

## 2018-03-22 DIAGNOSIS — Z832 Family history of diseases of the blood and blood-forming organs and certain disorders involving the immune mechanism: Secondary | ICD-10-CM

## 2018-03-22 DIAGNOSIS — I272 Pulmonary hypertension, unspecified: Secondary | ICD-10-CM | POA: Diagnosis not present

## 2018-03-22 LAB — ANTITHROMBIN III: AntiThromb III Func: 105 % (ref 75–120)

## 2018-03-22 MED ORDER — TECHNETIUM TC 99M DIETHYLENETRIAME-PENTAACETIC ACID: 32.1000 | Freq: Once | INTRAVENOUS | Status: DC | PRN

## 2018-03-22 MED ORDER — TECHNETIUM TO 99M ALBUMIN AGGREGATED: 4.1000 | Freq: Once | INTRAVENOUS | Status: DC | PRN

## 2018-03-22 NOTE — Progress Notes (Signed)
Pt has been made aware of normal result and verbalized understanding.  jw 03/22/18

## 2018-03-23 LAB — PROTEIN S ACTIVITY: Protein S Activity: 76 % (ref 63–140)

## 2018-03-23 LAB — PROTEIN S, TOTAL: Protein S Ag, Total: 132 % (ref 60–150)

## 2018-03-23 LAB — LUPUS ANTICOAGULANT PANEL
DRVVT: 33.6 s (ref 0.0–47.0)
PTT Lupus Anticoagulant: 33.1 s (ref 0.0–51.9)

## 2018-03-23 LAB — PROTEIN C ACTIVITY: Protein C Activity: 121 % (ref 73–180)

## 2018-03-24 LAB — PROTEIN C, TOTAL: Protein C, Total: 116 % (ref 60–150)

## 2018-03-24 LAB — CARDIOLIPIN ANTIBODIES, IGG, IGM, IGA: Anticardiolipin IgG: <9 GPL U/mL (ref 0–14)

## 2018-03-24 LAB — BETA-2-GLYCOPROTEIN I ABS, IGG/M/A: Beta-2 Glyco I IgG: <9 GPI IgG units (ref 0–20)

## 2018-03-26 LAB — PROTHROMBIN GENE MUTATION

## 2018-03-26 LAB — FACTOR 5 LEIDEN

## 2018-03-31 ENCOUNTER — Telehealth: Payer: Self-pay | Admitting: Physician Assistant

## 2018-03-31 DIAGNOSIS — Z1589 Genetic susceptibility to other disease: Secondary | ICD-10-CM

## 2018-03-31 NOTE — Telephone Encounter (Signed)
Please call lab and find out who these results went to, as they did not come to my box. I noticed they had not yet come back to my box so went looking for them and they don't actually have a provider listed. All the labs our office entered by themselves are still pended.  Please call patient. 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 Although she herself has not had an acute clotting event, given her history of markedly elevated d-dimer in the past and extensive family history of clotting, I would like her to be referred to a hematologist to discuss how this relates to future risk of an event, and if there is any information that would be helpful to her family. Thank you.  Eloni Darius PA-C

## 2018-03-31 NOTE — Telephone Encounter (Signed)
Pt has been made aware of her lab results. She is agreeable with referral to Hematology. Referral has been put in and pt aware that she will be contacted by their office for an appt.  Pt thanked me for the call.

## 2018-04-09 ENCOUNTER — Telehealth: Payer: Self-pay | Admitting: Oncology

## 2018-04-09 ENCOUNTER — Encounter: Payer: Self-pay | Admitting: Oncology

## 2018-04-09 NOTE — Telephone Encounter (Signed)
Received a new referral from Ronie Spiesayna Dunn, PA for prothrombin gene mutation and single gene mutation for Factor 5. Pt has been cld and scheduled to see Dr. Clelia CroftShadad on 05/14/18 at 11am. Pt aware to arrive 30 minutes early. Letter mailed.

## 2018-05-14 ENCOUNTER — Inpatient Hospital Stay: Payer: Medicare Other | Attending: Oncology | Admitting: Oncology

## 2018-05-14 VITALS — BP 193/68 | HR 60 | Temp 98.5°F | Resp 17 | Ht 63.0 in | Wt 196.6 lb

## 2018-05-14 DIAGNOSIS — D6852 Prothrombin gene mutation: Secondary | ICD-10-CM | POA: Insufficient documentation

## 2018-05-14 DIAGNOSIS — I129 Hypertensive chronic kidney disease with stage 1 through stage 4 chronic kidney disease, or unspecified chronic kidney disease: Secondary | ICD-10-CM | POA: Insufficient documentation

## 2018-05-14 DIAGNOSIS — I272 Pulmonary hypertension, unspecified: Secondary | ICD-10-CM | POA: Insufficient documentation

## 2018-05-14 DIAGNOSIS — D6859 Other primary thrombophilia: Secondary | ICD-10-CM | POA: Insufficient documentation

## 2018-05-14 DIAGNOSIS — Z86711 Personal history of pulmonary embolism: Secondary | ICD-10-CM | POA: Diagnosis not present

## 2018-05-14 DIAGNOSIS — I493 Ventricular premature depolarization: Secondary | ICD-10-CM | POA: Insufficient documentation

## 2018-05-14 DIAGNOSIS — R7989 Other specified abnormal findings of blood chemistry: Secondary | ICD-10-CM | POA: Insufficient documentation

## 2018-05-14 DIAGNOSIS — D6851 Activated protein C resistance: Secondary | ICD-10-CM | POA: Insufficient documentation

## 2018-05-14 DIAGNOSIS — Z79899 Other long term (current) drug therapy: Secondary | ICD-10-CM | POA: Diagnosis not present

## 2018-05-14 DIAGNOSIS — E785 Hyperlipidemia, unspecified: Secondary | ICD-10-CM | POA: Diagnosis not present

## 2018-05-14 DIAGNOSIS — R002 Palpitations: Secondary | ICD-10-CM | POA: Diagnosis not present

## 2018-05-14 DIAGNOSIS — N183 Chronic kidney disease, stage 3 (moderate): Secondary | ICD-10-CM | POA: Diagnosis not present

## 2018-05-14 NOTE — Progress Notes (Signed)
Reason for the request: Inherited thrombophilia  HPI: I was asked by Dr. Eldridge Dace to evaluate Ms. Jasmine Dawson for abnormal hypercoagulable panel.  She is a 73 year old woman with history of paroxysmal PVC and chronic kidney disease who was found to have elevated PA pressure concerning for pulmonary hypertension underwent an evaluation for chronic PE.  She had a VQ scan which showed a very low probability for PE in November 2019.  She had lower extremity Dopplers in July 2019 and were negative.  She had a mildly elevated d-dimer of 1.03.  Based on these findings she underwent a hypercoagulable panel is a family history of thrombophilia.  Her hypercoagulable panel 03/22/2018 showed no lupus anticoagulant detected.  Her beta-2 glycoprotein all within normal range.  Her Antithrombin III level was normal with normal protein S and protein C.  She did have a heterozygous factor V Leiden mutation as well as prothrombin gene mutation.  Clinically, she has no complaints at this time.  She denies any personal history of thrombosis including DVT and PE.  She had 5 pregnancies without any issues.  She had tubal ligation and ablation of fibroid tumors in the past without any postoperative thrombosis.  She does have extensive family history of thrombosis on her father's side mostly.  She has 1 sister that has DVT and PE that is currently in the hospital.  She also had a brother that had sudden cardiac death.  She does not report any headaches, blurry vision, syncope or seizures. Does not report any fevers, chills or sweats.  Does not report any cough, wheezing or hemoptysis.  Does not report any chest pain, palpitation, orthopnea or leg edema.  Does not report any nausea, vomiting or abdominal pain.  Does not report any constipation or diarrhea.  Does not report any skeletal complaints.    Does not report frequency, urgency or hematuria.  Does not report any skin rashes or lesions. Does not report any heat or cold  intolerance.  Does not report any lymphadenopathy or petechiae.  Does not report any anxiety or depression.  Remaining review of systems is negative.    Past Medical History:  Diagnosis Date  . CKD (chronic kidney disease), stage III (HCC)   . Dysrhythmia    occasional PVCs  . Heart palpitations   . Hyperlipidemia   . Hypertension   . Motion sickness   . Pulmonary hypertension (HCC)    a. 2D echo 11/01/17 showed EF 65-70%, grade 1 DD, mod-severe TR, moderately increased PASP .  [name] SVT (supraventricular tachycardia) (HCC)   . Tricuspid regurgitation   :  Past Surgical History:  Procedure Laterality Date  . HYSTEROSCOPY W/D&C  11/07/2011   Procedure: DILATATION AND CURETTAGE /HYSTEROSCOPY;  Surgeon: Antionette Char, MD;  Location: [facility];  Service: Gynecology;  Laterality: N/A;  endometrial polypectomy  . TUBAL LIGATION  1977  :   Current Outpatient Medications:  .  atenolol (TENORMIN) 50 MG tablet, Take 1 tablet (50 mg total) by mouth daily., Disp: 90 tablet, Rfl: 3 .  BIOTIN PO, Take 200 mg by mouth daily., Disp: , Rfl:  .  Chlorpheniramine-Acetaminophen (CORICIDIN HBP COLD/FLU PO), Take 1 tablet by mouth as needed., Disp: , Rfl:  .  clorazepate (TRANXENE) 7.5 MG tablet, Take 7.5 mg by mouth 2 (two) times daily as needed for anxiety., Disp: , Rfl:  .  COENZYME Q10 PO, Take 300 mg by mouth daily., Disp: , Rfl:  .  famotidine (PEPCID) 20 MG tablet, Take 1 tablet (20  mg total) by mouth 2 (two) times daily., Disp: 30 tablet, Rfl: 0 .  meclizine (ANTIVERT) 25 MG tablet, Take 25 mg by mouth 3 (three) times daily as needed for dizziness., Disp: , Rfl: 1 .  triamterene-hydrochlorothiazide (DYAZIDE) 37.5-25 MG capsule, Take 1 capsule by mouth daily as needed (fluid)., Disp: , Rfl: :  Allergies  Allergen Reactions  . Prednisone Anaphylaxis, Hives and Itching  . Sulfa Antibiotics Anaphylaxis, Hives and Itching  . Caffeine Other (See Comments)    Heart flutters  . Eggs Or  Egg-Derived Products Swelling    Throat swelling  . Influenza Vaccines   . Other Hives, Itching, Nausea And Vomiting, Swelling, Rash and Other (See Comments)    High acid foods. Tomatoes, oranges Throat swelling [employer]  . Shellfish Allergy Itching, Nausea And Vomiting and Rash  :  Family History  Problem Relation Age of Onset  . Diabetes Mother        died at 88  . Heart attack Mother   . Heart attack Father        died at 53  :  Social History   Socioeconomic History  . Marital status: Married    Spouse name: Not on file  . Number of children: Not on file  . Years of education: Not on file  . Highest education level: Not on file  Occupational History  . Not on file  Social Needs  . Financial resource strain: Not on file  . Food insecurity:    Worry: Not on file    Inability: Not on file  . Transportation needs:    Medical: Not on file    Non-medical: Not on file  Tobacco Use  . Smoking status: Never Smoker  . Smokeless tobacco: Never Used  Substance and Sexual Activity  . Alcohol use: No  . Drug use: No  . Sexual activity: Not Currently  Lifestyle  . Physical activity:    Days per week: Not on file    Minutes per session: Not on file  . Stress: Not on file  Relationships  . Social connections:    Talks on phone: Not on file    Gets together: Not on file    Attends religious service: Not on file    Active member of club or organization: Not on file    Attends meetings of clubs or organizations: Not on file    Relationship status: Not on file  . Intimate partner violence:    Fear of current or ex partner: Not on file    Emotionally abused: Not on file    Physically abused: Not on file    Forced sexual activity: Not on file  Other Topics Concern  . Not on file  Social History Narrative   Did daycare as a younger lady.  Retired at [employer] [phone]            :  Pertinent items are noted in HPI.  Exam: ECOG 0  General  appearance: alert and cooperative appeared without distress. Head: atraumatic without any abnormalities. Eyes: conjunctivae/corneas clear. PERRL.  Sclera anicteric. Throat: lips, mucosa, and tongue normal; without oral thrush or ulcers. Resp: clear to auscultation bilaterally without rhonchi, wheezes or dullness to percussion. Cardio: regular rate and rhythm, S1, S2 normal, no murmur, click, rub or gallop GI: soft, non-tender; bowel sounds normal; no masses,  no organomegaly Skin: Skin color, texture, turgor normal. No rashes or lesions Lymph nodes: Cervical, supraclavicular, and axillary nodes  normal. Neurologic: Grossly normal without any motor, sensory or deep tendon reflexes. Musculoskeletal: No joint deformity or effusion.  CBC    Component Value Date/Time   WBC 7.3 03/18/2018 1651   WBC 8.9 11/01/2017 0246   RBC 4.57 03/18/2018 1651   RBC 4.05 11/01/2017 0246   HGB 13.6 03/18/2018 1651   HCT 40.7 03/18/2018 1651   PLT 308 03/18/2018 1651   MCV 89 03/18/2018 1651   MCH 29.8 03/18/2018 1651   MCH 29.1 11/01/2017 0246   MCHC 33.4 03/18/2018 1651   MCHC 31.6 11/01/2017 0246   RDW 13.7 03/18/2018 1651   LYMPHSABS 3.7 09/24/2016 0714   MONOABS 0.3 09/24/2016 0714   EOSABS 0.1 09/24/2016 0714   BASOSABS 0.0 09/24/2016 0714      Chemistry      Component Value Date/Time   NA 140 03/18/2018 1651   K 4.9 03/18/2018 1651   CL 102 03/18/2018 1651   CO2 23 03/18/2018 1651   BUN 26 03/18/2018 1651   CREATININE 1.65 (H) 03/18/2018 1651      Component Value Date/Time   CALCIUM 9.5 03/18/2018 1651   ALKPHOS 71 09/24/2016 0714   AST 42 (H) 09/24/2016 0714   ALT 19 09/24/2016 0714   BILITOT 0.6 09/24/2016 0714       Assessment and Plan:   73 year old woman with the following:  1.  Inherited thrombophilia: Hypercoagulable panel obtained in November 2019 showed a combined heterozygous mutation of factor V as well as prothrombin gene mutation.  She has no other abnormalities  detected at this time.  She does not have any history of thrombosis although she does have family history.  The natural course of these findings and risk of thrombosis was assessed.  Given the fact that she has not had any thrombosis including multiple studies including CT scan of the chest as well as VQ scan and Dopplers, it appears that her risk of thrombosis is higher than general population but not enough to develop a blood clot.  At this point, I do not recommend any anticoagulation unless she develops blood clots in the future.  I have recommended aggressive DVT prophylaxis after any surgical procedure or immobilization.  I have recommended also mobility and moving around during a long air or car travel.  Stretching exercise every 2 hours will be mandatory in this particular setting.  It is imperative that she informs her doctors in the future if she is to have any surgery about her genetic tendency for potential clots.  All her questions were answered today to her satisfaction.  2.  Follow-up: I am happy to see her in the future as needed.   40  minutes was spent with the patient face-to-face today.  More than 50% of time was dedicated to reviewing laboratory data, the natural course of her illness and answering question regarding future plan of care.    Thank you for the referral.  A copy of this consult has been forwarded to the requesting physician.

## 2018-05-19 ENCOUNTER — Telehealth: Payer: Self-pay

## 2018-05-19 NOTE — Telephone Encounter (Signed)
Per 1/17 no los °

## 2018-06-30 ENCOUNTER — Ambulatory Visit: Payer: Medicare Other | Admitting: Interventional Cardiology

## 2018-07-20 ENCOUNTER — Encounter (HOSPITAL_COMMUNITY): Payer: Self-pay | Admitting: Emergency Medicine

## 2018-07-20 ENCOUNTER — Emergency Department (HOSPITAL_COMMUNITY)
Admission: EM | Admit: 2018-07-20 | Discharge: 2018-07-20 | Disposition: A | Discharge status: Home / Self Care | Payer: Medicare Other | Attending: Emergency Medicine | Admitting: Emergency Medicine

## 2018-07-20 ENCOUNTER — Other Ambulatory Visit: Payer: Self-pay

## 2018-07-20 DIAGNOSIS — Z79899 Other long term (current) drug therapy: Secondary | ICD-10-CM | POA: Diagnosis not present

## 2018-07-20 DIAGNOSIS — I129 Hypertensive chronic kidney disease with stage 1 through stage 4 chronic kidney disease, or unspecified chronic kidney disease: Secondary | ICD-10-CM | POA: Diagnosis not present

## 2018-07-20 DIAGNOSIS — R55 Syncope and collapse: Secondary | ICD-10-CM | POA: Insufficient documentation

## 2018-07-20 DIAGNOSIS — N183 Chronic kidney disease, stage 3 (moderate): Secondary | ICD-10-CM | POA: Diagnosis not present

## 2018-07-20 LAB — BASIC METABOLIC PANEL
Anion gap: 5 (ref 5–15)
BUN: 18 mg/dL (ref 8–23)
CHLORIDE: 110 mmol/L (ref 98–111)
CO2: 21 mmol/L — AB (ref 22–32)
CREATININE: 1.39 mg/dL — AB (ref 0.44–1.00)
Calcium: 9 mg/dL (ref 8.9–10.3)
GFR calc Af Amer: 43 mL/min — ABNORMAL LOW (ref >60)
GFR calc non Af Amer: 37 mL/min — ABNORMAL LOW (ref >60)
Glucose, Bld: 107 mg/dL — ABNORMAL HIGH (ref 70–99)
Potassium: 3.4 mmol/L — ABNORMAL LOW (ref 3.5–5.1)
Sodium: 136 mmol/L (ref 135–145)

## 2018-07-20 LAB — CBC
HEMATOCRIT: 41.3 % (ref 36.0–46.0)
HEMOGLOBIN: 13.4 g/dL (ref 12.0–15.0)
MCH: 29.4 pg (ref 26.0–34.0)
MCHC: 32.4 g/dL (ref 30.0–36.0)
MCV: 90.6 fL (ref 80.0–100.0)
Platelets: 241 10*3/uL (ref 150–400)
RBC: 4.56 MIL/uL (ref 3.87–5.11)
RDW: 13.9 % (ref 11.5–15.5)
WBC: 7.3 10*3/uL (ref 4.0–10.5)
nRBC: 0 % (ref 0.0–0.2)

## 2018-07-20 MED ORDER — ACETAMINOPHEN 500 MG PO TABS
1000.0000 mg | ORAL_TABLET | Freq: Once | ORAL | Status: AC
  Administered 2018-07-20: 1000 mg via ORAL
  Filled 2018-07-20: qty 2

## 2018-07-20 MED ORDER — SODIUM CHLORIDE 0.9 % IV BOLUS
500.0000 mL | Freq: Once | INTRAVENOUS | Status: AC
  Administered 2018-07-20: 500 mL via INTRAVENOUS

## 2018-07-20 NOTE — ED Triage Notes (Signed)
Pt BIB [facility] from home, pt complains of near syncopal episode and some continuing n/v. Pt BP 234/100 with EMS.

## 2018-07-20 NOTE — Discharge Instructions (Addendum)
Tests showed no life-threatening condition.  Rest.  Follow-up your primary care doctor.

## 2018-07-20 NOTE — ED Notes (Signed)
Walked patient to the bathroom patient did well patient is now back in bed with call bell in reach 
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
## 2018-07-28 ENCOUNTER — Other Ambulatory Visit: Payer: Self-pay

## 2018-07-28 ENCOUNTER — Inpatient Hospital Stay (HOSPITAL_COMMUNITY)
Admission: EM | Admit: 2018-07-28 | Discharge: 2018-07-31 | DRG: 041 | Disposition: A | Discharge status: Home / Self Care | Payer: Medicare Other | Attending: Internal Medicine | Admitting: Internal Medicine

## 2018-07-28 ENCOUNTER — Ambulatory Visit (INDEPENDENT_AMBULATORY_CARE_PROVIDER_SITE_OTHER)
Admission: EM | Admit: 2018-07-28 | Discharge: 2018-07-28 | Disposition: A | Discharge status: Home / Self Care | Payer: Medicare Other | Source: Home / Self Care | Attending: Family Medicine | Admitting: Family Medicine

## 2018-07-28 ENCOUNTER — Inpatient Hospital Stay (HOSPITAL_COMMUNITY): Payer: Medicare Other

## 2018-07-28 ENCOUNTER — Emergency Department (HOSPITAL_COMMUNITY): Payer: Medicare Other

## 2018-07-28 ENCOUNTER — Encounter (HOSPITAL_COMMUNITY): Payer: Self-pay

## 2018-07-28 DIAGNOSIS — I63412 Cerebral infarction due to embolism of left middle cerebral artery: Secondary | ICD-10-CM | POA: Diagnosis present

## 2018-07-28 DIAGNOSIS — E785 Hyperlipidemia, unspecified: Secondary | ICD-10-CM | POA: Diagnosis present

## 2018-07-28 DIAGNOSIS — E669 Obesity, unspecified: Secondary | ICD-10-CM | POA: Diagnosis present

## 2018-07-28 DIAGNOSIS — Z833 Family history of diabetes mellitus: Secondary | ICD-10-CM | POA: Diagnosis not present

## 2018-07-28 DIAGNOSIS — G8191 Hemiplegia, unspecified affecting right dominant side: Secondary | ICD-10-CM | POA: Diagnosis present

## 2018-07-28 DIAGNOSIS — Z8249 Family history of ischemic heart disease and other diseases of the circulatory system: Secondary | ICD-10-CM | POA: Diagnosis not present

## 2018-07-28 DIAGNOSIS — I272 Pulmonary hypertension, unspecified: Secondary | ICD-10-CM | POA: Diagnosis present

## 2018-07-28 DIAGNOSIS — R29898 Other symptoms and signs involving the musculoskeletal system: Secondary | ICD-10-CM | POA: Diagnosis present

## 2018-07-28 DIAGNOSIS — Z8679 Personal history of other diseases of the circulatory system: Secondary | ICD-10-CM | POA: Diagnosis not present

## 2018-07-28 DIAGNOSIS — I129 Hypertensive chronic kidney disease with stage 1 through stage 4 chronic kidney disease, or unspecified chronic kidney disease: Secondary | ICD-10-CM | POA: Diagnosis present

## 2018-07-28 DIAGNOSIS — N183 Chronic kidney disease, stage 3 (moderate): Secondary | ICD-10-CM | POA: Diagnosis present

## 2018-07-28 DIAGNOSIS — I2510 Atherosclerotic heart disease of native coronary artery without angina pectoris: Secondary | ICD-10-CM | POA: Diagnosis present

## 2018-07-28 DIAGNOSIS — I6340 Cerebral infarction due to embolism of unspecified cerebral artery: Secondary | ICD-10-CM

## 2018-07-28 DIAGNOSIS — D6851 Activated protein C resistance: Secondary | ICD-10-CM | POA: Diagnosis present

## 2018-07-28 DIAGNOSIS — I6389 Other cerebral infarction: Secondary | ICD-10-CM | POA: Diagnosis not present

## 2018-07-28 DIAGNOSIS — R4701 Aphasia: Secondary | ICD-10-CM | POA: Diagnosis present

## 2018-07-28 DIAGNOSIS — D6859 Other primary thrombophilia: Secondary | ICD-10-CM | POA: Diagnosis not present

## 2018-07-28 DIAGNOSIS — I700 Atherosclerosis of aorta: Secondary | ICD-10-CM | POA: Diagnosis not present

## 2018-07-28 DIAGNOSIS — R001 Bradycardia, unspecified: Secondary | ICD-10-CM | POA: Diagnosis present

## 2018-07-28 DIAGNOSIS — I071 Rheumatic tricuspid insufficiency: Secondary | ICD-10-CM | POA: Diagnosis present

## 2018-07-28 DIAGNOSIS — G8321 Monoplegia of upper limb affecting right dominant side: Secondary | ICD-10-CM | POA: Diagnosis not present

## 2018-07-28 DIAGNOSIS — I471 Supraventricular tachycardia: Secondary | ICD-10-CM | POA: Diagnosis present

## 2018-07-28 DIAGNOSIS — I63419 Cerebral infarction due to embolism of unspecified middle cerebral artery: Secondary | ICD-10-CM | POA: Diagnosis present

## 2018-07-28 DIAGNOSIS — Z79899 Other long term (current) drug therapy: Secondary | ICD-10-CM | POA: Diagnosis not present

## 2018-07-28 DIAGNOSIS — I160 Hypertensive urgency: Secondary | ICD-10-CM

## 2018-07-28 DIAGNOSIS — I340 Nonrheumatic mitral (valve) insufficiency: Secondary | ICD-10-CM | POA: Diagnosis not present

## 2018-07-28 DIAGNOSIS — Z91012 Allergy to eggs: Secondary | ICD-10-CM | POA: Diagnosis not present

## 2018-07-28 DIAGNOSIS — Z881 Allergy status to other antibiotic agents status: Secondary | ICD-10-CM | POA: Diagnosis not present

## 2018-07-28 DIAGNOSIS — I351 Nonrheumatic aortic (valve) insufficiency: Secondary | ICD-10-CM | POA: Diagnosis not present

## 2018-07-28 DIAGNOSIS — R531 Weakness: Secondary | ICD-10-CM

## 2018-07-28 DIAGNOSIS — Z7902 Long term (current) use of antithrombotics/antiplatelets: Secondary | ICD-10-CM | POA: Diagnosis not present

## 2018-07-28 DIAGNOSIS — I63512 Cerebral infarction due to unspecified occlusion or stenosis of left middle cerebral artery: Secondary | ICD-10-CM | POA: Diagnosis not present

## 2018-07-28 DIAGNOSIS — I639 Cerebral infarction, unspecified: Secondary | ICD-10-CM | POA: Diagnosis not present

## 2018-07-28 DIAGNOSIS — Z7982 Long term (current) use of aspirin: Secondary | ICD-10-CM | POA: Diagnosis not present

## 2018-07-28 DIAGNOSIS — Z832 Family history of diseases of the blood and blood-forming organs and certain disorders involving the immune mechanism: Secondary | ICD-10-CM

## 2018-07-28 DIAGNOSIS — Z6831 Body mass index (BMI) 31.0-31.9, adult: Secondary | ICD-10-CM | POA: Diagnosis not present

## 2018-07-28 LAB — URINALYSIS, ROUTINE W REFLEX MICROSCOPIC
Bilirubin Urine: NEGATIVE
Glucose, UA: NEGATIVE mg/dL
Hgb urine dipstick: NEGATIVE
Ketones, ur: NEGATIVE mg/dL
Leukocytes,Ua: NEGATIVE
Nitrite: NEGATIVE
Protein, ur: NEGATIVE mg/dL
Specific Gravity, Urine: 1.008 (ref 1.005–1.030)
pH: 5 (ref 5.0–8.0)

## 2018-07-28 LAB — CBC
HCT: 41.1 % (ref 36.0–46.0)
Hemoglobin: 12.9 g/dL (ref 12.0–15.0)
MCH: 28.2 pg (ref 26.0–34.0)
MCHC: 31.4 g/dL (ref 30.0–36.0)
MCV: 89.9 fL (ref 80.0–100.0)
Platelets: 238 10*3/uL (ref 150–400)
RBC: 4.57 MIL/uL (ref 3.87–5.11)
RDW: 13.8 % (ref 11.5–15.5)
WBC: 7.1 10*3/uL (ref 4.0–10.5)
nRBC: 0 % (ref 0.0–0.2)

## 2018-07-28 LAB — COMPREHENSIVE METABOLIC PANEL
ALT: 17 U/L (ref 0–44)
AST: 32 U/L (ref 15–41)
Albumin: 3.8 g/dL (ref 3.5–5.0)
Alkaline Phosphatase: 69 U/L (ref 38–126)
Anion gap: 7 (ref 5–15)
BUN: 15 mg/dL (ref 8–23)
CO2: 25 mmol/L (ref 22–32)
Calcium: 9.3 mg/dL (ref 8.9–10.3)
Chloride: 107 mmol/L (ref 98–111)
Creatinine, Ser: 1.37 mg/dL — ABNORMAL HIGH (ref 0.44–1.00)
GFR calc Af Amer: 44 mL/min — ABNORMAL LOW (ref >60)
GFR calc non Af Amer: 38 mL/min — ABNORMAL LOW (ref >60)
Glucose, Bld: 94 mg/dL (ref 70–99)
Potassium: 3.6 mmol/L (ref 3.5–5.1)
Sodium: 139 mmol/L (ref 135–145)
Total Bilirubin: 0.5 mg/dL (ref 0.3–1.2)
Total Protein: 7.2 g/dL (ref 6.5–8.1)

## 2018-07-28 LAB — PROTIME-INR
INR: 1 (ref 0.8–1.2)
Prothrombin Time: 13.1 seconds (ref 11.4–15.2)

## 2018-07-28 LAB — DIFFERENTIAL
Abs Immature Granulocytes: 0.02 10*3/uL (ref 0.00–0.07)
Basophils Absolute: 0 10*3/uL (ref 0.0–0.1)
Basophils Relative: 0 %
Eosinophils Absolute: 0.1 10*3/uL (ref 0.0–0.5)
Eosinophils Relative: 1 %
Immature Granulocytes: 0 %
Lymphocytes Relative: 32 %
Lymphs Abs: 2.2 10*3/uL (ref 0.7–4.0)
Monocytes Absolute: 0.4 10*3/uL (ref 0.1–1.0)
Monocytes Relative: 6 %
Neutro Abs: 4.3 10*3/uL (ref 1.7–7.7)
Neutrophils Relative %: 61 %

## 2018-07-28 LAB — APTT: aPTT: 32 seconds (ref 24–36)

## 2018-07-28 MED ORDER — ENOXAPARIN SODIUM 40 MG/0.4ML ~~LOC~~ SOLN
40.0000 mg | SUBCUTANEOUS | Status: DC
  Administered 2018-07-28 – 2018-07-30 (×3): 40 mg via SUBCUTANEOUS
  Filled 2018-07-28 (×3): qty 0.4

## 2018-07-28 MED ORDER — ASPIRIN 325 MG PO TABS
325.0000 mg | ORAL_TABLET | Freq: Every day | ORAL | Status: DC
  Administered 2018-07-28: 22:00:00 325 mg via ORAL
  Filled 2018-07-28: qty 1

## 2018-07-28 MED ORDER — ACETAMINOPHEN 650 MG RE SUPP: 650.0000 mg | Freq: Four times a day (QID) | RECTAL | Status: DC | PRN

## 2018-07-28 MED ORDER — STROKE: EARLY STAGES OF RECOVERY BOOK
Freq: Once | Status: AC
  Administered 2018-07-28: 22:00:00

## 2018-07-28 MED ORDER — IOHEXOL 350 MG/ML SOLN
100.0000 mL | Freq: Once | INTRAVENOUS | Status: AC | PRN
  Administered 2018-07-28: 21:00:00 100 mL via INTRAVENOUS

## 2018-07-28 MED ORDER — ACETAMINOPHEN 325 MG PO TABS: 650.0000 mg | ORAL_TABLET | Freq: Four times a day (QID) | ORAL | Status: DC | PRN

## 2018-07-28 MED ORDER — ASPIRIN 300 MG RE SUPP: 300.0000 mg | Freq: Every day | RECTAL | Status: DC

## 2018-07-28 MED ORDER — SENNOSIDES-DOCUSATE SODIUM 8.6-50 MG PO TABS: 1.0000 | ORAL_TABLET | Freq: Every evening | ORAL | Status: DC | PRN

## 2018-07-28 NOTE — ED Triage Notes (Signed)
Pt presents with hypertension, dizziness, slurred speech, walking imbalance, and weakness in extremities for over a week.

## 2018-07-28 NOTE — ED Notes (Signed)
Patient transported to CT 

## 2018-07-28 NOTE — ED Provider Notes (Signed)
[facility] EMERGENCY DEPARTMENT Provider Note   CSN: [id number] Arrival date & time: 07/28/18  1528    History   Chief Complaint Chief Complaint  Patient presents with   Hypertension   Aphasia   Weakness    HPI Jasmine Dawson is a 73 y.o. female.     73 year old female with past medical history including CKD, hypertension, hyperlipidemia, pulmonary hypertension, SVT who presents with right hand weakness.  Patient states for at least the past week, she has been having problems with elevated blood pressure at home.  She states that her values that she has been getting at home are around 180s/80s.  Her PCP added a new blood pressure medication (clonidine) which she thinks she started taking ~2 days ago. Since at least 2 days ago, she has noticed problems with coordination in her right hand (she is right-hand dominant) including weakness and difficulty picking things up and writing.  She has not noticed any other areas of weakness.  She also notes some problems with balance while walking and some intermittent difficulty with speaking.  Her hand weakness has been constant.  She reports intermittent headaches over the past few days.  She denies any vision changes, chest pain, shortness of breath, nausea, vomiting, fevers, URI symptoms, urinary symptoms, or recent illness.  She has never had symptoms like this before.  She went to an urgent care today and was referred here due to concern for possible stroke. Symptoms have been present for at least 2 days; pt told urgent care 1 week of symptoms.  The history is provided by the patient.  Hypertension   Weakness    Past Medical History:  Diagnosis Date   CKD (chronic kidney disease), stage III (HCC)    Dysrhythmia    occasional PVCs   Heart palpitations    Hyperlipidemia    Hypertension    Motion sickness    Pulmonary hypertension (HCC)    a. 2D echo 11/01/17 showed EF 65-70%, grade 1 DD, mod-severe TR,  moderately increased PASP .   SVT (supraventricular tachycardia) (HCC)    Tricuspid regurgitation     Patient Active Problem List   Diagnosis Date Noted   Chest pain 10/31/2017   Palpitations 10/31/2017   Right leg pain 10/31/2017   Elevated troponin 10/31/2017   SVT (supraventricular tachycardia) (HCC) 10/03/2016   CRI (chronic renal insufficiency), stage 3 (moderate) (HCC) 10/03/2016   Endometrial polyp 11/07/2011   Dizzy spells 09/11/2011   HTN (hypertension) 09/11/2011   Abdominal pain 09/11/2011   Cholelithiasis 09/11/2011    Past Surgical History:  Procedure Laterality Date   HYSTEROSCOPY W/D&C  11/07/2011   Procedure: DILATATION AND CURETTAGE /HYSTEROSCOPY;  Surgeon: Antionette Char, MD;  Location: [facility];  Service: Gynecology;  Laterality: N/A;  endometrial polypectomy   TUBAL LIGATION  1977     OB History   No obstetric history on file.      Home Medications    Prior to Admission medications   Medication Sig Start Date End Date Taking? Authorizing Provider  atenolol (TENORMIN) 50 MG tablet Take 1 tablet (50 mg total) by mouth daily. 03/19/18 07/20/18  Dunn, Tacey Ruiz, PA-C  cloNIDine (CATAPRES) 0.1 MG tablet Take 0.1 mg by mouth daily. 07/22/18   [provider]  clorazepate (TRANXENE) 7.5 MG tablet Take 7.5 mg by mouth 2 (two) times daily as needed for anxiety.    [provider]  COENZYME Q10 PO Take 300 mg by mouth daily.  [provider]  famotidine (PEPCID) 20 MG tablet Take 1 tablet (20 mg total) by mouth 2 (two) times daily. 05/19/15   Garlon Hatchet, PA-C  meclizine (ANTIVERT) 25 MG tablet Take 25 mg by mouth 3 (three) times daily as needed for dizziness. 01/06/18   [provider]  triamterene-hydrochlorothiazide (DYAZIDE) 37.5-25 MG capsule Take 1 capsule by mouth daily as needed (fluid).    [provider]    Family History Family History  Problem Relation Age of Onset   Diabetes  Mother        died at 25   Heart attack Mother    Heart attack Father        died at 24    Social History Social History   Tobacco Use   Smoking status: Never Smoker   Smokeless tobacco: Never Used  Substance Use Topics   Alcohol use: No   Drug use: No     Allergies   Prednisone; Sulfa antibiotics; Caffeine; Eggs or egg-derived products; Influenza vaccines; Other; and Shellfish allergy   Review of Systems Review of Systems  Neurological: Positive for weakness.   All other systems reviewed and are negative except that which was mentioned in HPI   Physical Exam Updated Vital Signs BP (!) 196/88    Pulse (!) 53    Temp 98.3 F (36.8 C) (Oral)    Resp 14    SpO2 100%   Physical Exam Vitals signs and nursing note reviewed.  Constitutional:      General: She is not in acute distress.    Appearance: She is well-developed.     Comments: Awake, alert  HENT:     Head: Normocephalic and atraumatic.  Eyes:     Extraocular Movements: Extraocular movements intact.     Conjunctiva/sclera: Conjunctivae normal.     Pupils: Pupils are equal, round, and reactive to light.  Neck:     Musculoskeletal: Neck supple.  Cardiovascular:     Rate and Rhythm: Regular rhythm. Bradycardia present.     Heart sounds: Normal heart sounds. No murmur.  Pulmonary:     Effort: Pulmonary effort is normal. No respiratory distress.     Breath sounds: Normal breath sounds.  Abdominal:     General: Bowel sounds are normal. There is no distension.     Palpations: Abdomen is soft.     Tenderness: There is no abdominal tenderness.  Skin:    General: Skin is warm and dry.  Neurological:     Mental Status: She is alert and oriented to person, place, and time.     Motor: No abnormal muscle tone.     Deep Tendon Reflexes: Reflexes are normal and symmetric.     Comments: Fluent speech, very slight facial droop at R mouth; normal finger-to-nose testing, + R pronator drift, no clonus 5/5 strength  LUE, BLE 4+/5 strength RUE  normal sensation x all 4 extremities Slow, unsteady gait without severe ataxia  Psychiatric:        Thought Content: Thought content normal.        Judgment: Judgment normal.      ED Treatments / Results  Labs (all labs ordered are listed, but only abnormal results are displayed) Labs Reviewed  COMPREHENSIVE METABOLIC PANEL - Abnormal; Notable for the following components:      Result Value   Creatinine, Ser 1.37 (*)    GFR calc non Af Amer 38 (*)    GFR calc Af Amer 44 (*)  All other components within normal limits  URINALYSIS, ROUTINE W REFLEX MICROSCOPIC - Abnormal; Notable for the following components:   Color, Urine STRAW (*)    All other components within normal limits  PROTIME-INR  APTT  CBC  DIFFERENTIAL  HEMOGLOBIN A1C  LIPID PANEL  BASIC METABOLIC PANEL  CBC    EKG EKG Interpretation  Date/Time:  Wednesday July 28 2018 15:45:57 EDT Ventricular Rate:  52 PR Interval:    QRS Duration: 107 QT Interval:  469 QTC Calculation: 437 R Axis:   35 Text Interpretation:  Sinus rhythm Abnormal R-wave progression, early transition No significant change since last tracing Confirmed by Frederick Peers [phone]) on 07/28/2018 3:49:33 PM   Radiology Ct Angio Head W Or Wo Contrast  Result Date: 07/28/2018 CLINICAL DATA:  Initial evaluation for near syncope, dizziness. EXAM: CT ANGIOGRAPHY HEAD AND NECK TECHNIQUE: Multidetector CT imaging of the head and neck was performed using the standard protocol during bolus administration of intravenous contrast. Multiplanar CT image reconstructions and MIPs were obtained to evaluate the vascular anatomy. Carotid stenosis measurements (when applicable) are obtained utilizing NASCET criteria, using the distal internal carotid diameter as the denominator. CONTRAST:  OMNIPAQUE IOHEXOL 350 MG/ML SOLN COMPARISON:  Prior CT from earlier the same day. FINDINGS: CTA NECK FINDINGS Aortic arch: Visualized aortic arch  of normal caliber with normal 3 vessel morphology. Mild atheromatous plaque at the origin of the left CCA without significant stenosis. No hemodynamically significant stenosis about the origin of the great vessels. Visualized subclavian arteries widely patent. Right carotid system: Right CCA patent from its origin to the bifurcation without stenosis. No significant atheromatous narrowing about the right bifurcation. Right ICA widely patent distally to the skull base without stenosis, dissection, or occlusion. Left carotid system: Left common carotid artery patent from its origin to the bifurcation without stenosis. Mild scattered calcified plaque about the left bifurcation without hemodynamically significant stenosis. Left ICA widely patent to the skull base without stenosis, dissection, or occlusion. Vertebral arteries: Both of the vertebral arteries arise from the subclavian arteries. Vertebral arteries widely patent within the neck without stenosis, dissection, or occlusion. Skeleton: No acute osseous finding. No discrete lytic or blastic osseous lesions. Trace retrolisthesis of C5 on C6, likely chronic and degenerative. Other neck: No acute soft tissue abnormality within the neck. 1 cm hypodense right thyroid nodule noted, of doubtful significance given size and patient age. No adenopathy. Upper chest: Visualized upper chest demonstrates no acute finding. Review of the MIP images confirms the above findings CTA HEAD FINDINGS Anterior circulation: Petrous segments widely patent bilaterally. Scattered atheromatous plaque within the cavernous/supraclinoid ICAs bilaterally with mild to moderate multifocal narrowing (no more than 50%). Termini patent bilaterally. A1 segments well perfused. Normal anterior communicating artery. Anterior cerebral arteries patent to their distal aspects without stenosis. Right M1 widely patent. Multifocal moderate to severe distal left M1/proximal M2 stenoses (series 11, image 48).  Distal MCA branches well perfused and symmetric. Distal small vessel atheromatous irregularity. Posterior circulation: Vertebral arteries patent to the vertebrobasilar junction without stenosis. Posterior inferior cerebral arteries patent bilaterally. Basilar patent to its distal aspect without stenosis. Superior cerebral arteries patent bilaterally. Both of the posterior cerebral arteries primarily supplied via the basilar. PCAs are widely patent to their distal aspects. Distal small vessel atheromatous irregularity. Venous sinuses: Patent. Anatomic variants: None significant. Delayed phase: No abnormal enhancement. Review of the MIP images confirms the above findings IMPRESSION: 1. Negative CTA for large vessel occlusion. 2. 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 3. 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 4. Distal small vessel atheromatous irregularity throughout the intracranial arterial circulation. 5. Wide patency of the major arterial vasculature of the neck. Electronically Signed   By: Rise Mu M.D.   On: 07/28/2018 21:17   Ct Head Wo Contrast  Result Date: 07/28/2018 CLINICAL DATA:  Headache. EXAM: CT HEAD WITHOUT CONTRAST TECHNIQUE: Contiguous axial images were obtained from the base of the skull through the vertex without intravenous contrast. COMPARISON:  CT scan of Sep 24, 2016. FINDINGS: Brain: Mild chronic ischemic white matter disease is noted. No mass effect or midline shift is noted. Ventricular size is within normal limits. There is no evidence of mass lesion, hemorrhage or acute infarction. Vascular: No hyperdense vessel or unexpected calcification. Skull: Normal. Negative for fracture or focal lesion. Sinuses/Orbits: No acute finding. Other: None. IMPRESSION: Mild chronic ischemic white matter disease. No acute intracranial abnormality seen. Electronically Signed   By: Lupita Raider, M.D.   On: 07/28/2018 17:18   Ct Angio Neck W And/or Wo Contrast  Result Date: 07/28/2018 CLINICAL DATA:  Initial evaluation for near syncope, dizziness. EXAM: CT ANGIOGRAPHY HEAD AND NECK TECHNIQUE: Multidetector CT imaging of the head and neck was performed using the standard protocol during bolus administration of intravenous contrast. Multiplanar CT image reconstructions and MIPs were obtained to evaluate the vascular anatomy. Carotid stenosis measurements (when applicable) are obtained utilizing NASCET criteria, using the distal internal carotid diameter as the denominator. CONTRAST:  OMNIPAQUE IOHEXOL 350 MG/ML SOLN COMPARISON:  Prior CT from earlier the same day. FINDINGS: CTA NECK FINDINGS Aortic arch: Visualized aortic arch of normal caliber with normal 3 vessel morphology. Mild atheromatous plaque at the origin of the left CCA without significant stenosis. No hemodynamically significant stenosis about the origin of the great vessels. Visualized subclavian arteries widely patent. Right carotid system: Right CCA patent from its origin to the bifurcation without stenosis. No significant atheromatous narrowing about the right bifurcation. Right ICA widely patent distally to the skull base without stenosis, dissection, or occlusion. Left carotid system: Left common carotid artery patent from its origin to the bifurcation without stenosis. Mild scattered calcified plaque about the left bifurcation without hemodynamically significant stenosis. Left ICA widely patent to the skull base without stenosis, dissection, or occlusion. Vertebral arteries: Both of the vertebral arteries arise from the subclavian arteries. Vertebral arteries widely patent within the neck without stenosis, dissection, or occlusion. Skeleton: No acute osseous finding. No discrete lytic or blastic osseous lesions. Trace retrolisthesis of C5 on C6, likely chronic and degenerative. Other neck: No acute soft tissue abnormality  within the neck. 1 cm hypodense right thyroid nodule noted, of doubtful significance given size and patient age. No adenopathy. Upper chest: Visualized upper chest demonstrates no acute finding. Review of the MIP images confirms the above findings CTA HEAD FINDINGS Anterior circulation: Petrous segments widely patent bilaterally. Scattered atheromatous plaque within the cavernous/supraclinoid ICAs bilaterally with mild to moderate multifocal narrowing (no more than 50%). Termini patent bilaterally. A1 segments well perfused. Normal anterior communicating artery. Anterior cerebral arteries patent to their distal aspects without stenosis. Right M1 widely patent. Multifocal moderate to severe distal left M1/proximal M2 stenoses (series 11, image 48). Distal MCA branches well perfused and symmetric. Distal small vessel atheromatous irregularity. Posterior circulation: Vertebral arteries patent to the vertebrobasilar junction without stenosis. Posterior inferior cerebral arteries patent bilaterally. Basilar patent to its distal aspect without  stenosis. Superior cerebral arteries patent bilaterally. Both of the posterior cerebral arteries primarily supplied via the basilar. PCAs are widely patent to their distal aspects. Distal small vessel atheromatous irregularity. Venous sinuses: Patent. Anatomic variants: None significant. Delayed phase: No abnormal enhancement. Review of the MIP images confirms the above findings IMPRESSION: 1. Negative CTA for large vessel occlusion. 2. Multifocal moderate to severe distal left M1/proximal M2 stenoses. 3. 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 4. Distal small vessel atheromatous irregularity throughout the intracranial arterial circulation. 5. Wide patency of the major arterial vasculature of the neck. Electronically Signed   By: Rise Mu M.D.   On: 07/28/2018 21:17    Procedures Procedures  (including critical care time)  Medications Ordered in ED Medications - No data to display   Initial Impression / Assessment and Plan / ED Course  I have reviewed the triage vital signs and the nursing notes.  Pertinent labs & imaging results that were available during my care of the patient were reviewed by me and considered in my medical decision making (see chart for details).       VS notable for BP 196/88, HR 50s. Neuro exam notable for mild R arm weakness and pronator drift. Concern for stroke; pt is outside window for tPA or intervention as sx present for at least 2 days, therefore did not call code stroke. Labwork overall reassuring, Cr 1.37.  Head CT with chronic ischemic white matter disease but no acute findings.  Discussed with Dr. Amada Jupiter, neurologist, who agreed with plan to admit for TIA versus stroke work-up and PT evaluation given balance problems w/ ambulation. Discussed w/ internal medicine teaching service and patient admitted for further care.  I have ordered CTA head and neck.  Final Clinical Impressions(s) / ED Diagnoses   Final diagnoses:  Right arm weakness  Hypertensive urgency    ED Discharge Orders    None       Baljit Liebert, Ambrose [place], MD 07/28/18 2303

## 2018-07-28 NOTE — Consult Note (Signed)
Neurology Consultation Reason for Consult: Stroke Referring Physician: Little, R  CC: Right arm weakness  History is obtained from: Patient  HPI: Jasmine Dawson is a 73 y.o. female who states that she first started noticing problems with her right side this past weekend.  She thinks it was on Sunday.  Since that time, it is been persistent.  She has not noticed any numbness, just difficulty with using her right hand predominantly.  She has not noted any slurred speech or drooling.  She denies any vision changes.  This is not something she has ever experienced before.  LKW: Sunday tpa given?: no, out of window  ROS: A 14 point ROS was performed and is negative except as noted in the HPI.  Past Medical History:  Diagnosis Date  . CKD (chronic kidney disease), stage III (HCC)   . Dysrhythmia    occasional PVCs  . Heart palpitations   . Hyperlipidemia   . Hypertension   . Motion sickness   . Pulmonary hypertension (HCC)    a. 2D echo 11/01/17 showed EF 65-70%, grade 1 DD, mod-severe TR, moderately increased PASP .  [name] SVT (supraventricular tachycardia) (HCC)   . Tricuspid regurgitation      Family History  Problem Relation Age of Onset  . Diabetes Mother        died at 66  . Heart attack Mother   . Heart attack Father        died at 68     Social History:  reports that she has never smoked. She has never used smokeless tobacco. She reports that she does not drink alcohol or use drugs.   Exam: Current vital signs: BP (!) 183/72 (BP Location: Right Arm)   Pulse (!) 52   Temp 97.7 F (36.5 C) (Oral)   Resp 16   Ht 5\' 4"  (1.626 m)   Wt 84.2 kg   SpO2 99%   BMI 31.86 kg/m  Vital signs in last 24 hours: Temp:  [97.7 F (36.5 C)-98.3 F (36.8 C)] 97.7 F (36.5 C) (04/01 2330) Pulse Rate:  [37-55] 52 (04/01 2330) Resp:  [10-21] 16 (04/01 2330) BP: (153-214)/(64-88) 183/72 (04/01 2330) SpO2:  [98 %-100 %] 99 % (04/01 2330) Weight:  [84.2 kg] 84.2 kg (04/01  2145)   Physical Exam  Constitutional: Appears well-developed and well-nourished.  Psych: Affect appropriate to situation Eyes: No scleral injection HENT: No OP obstrucion Head: Normocephalic.  Cardiovascular: Normal rate and regular rhythm.  Respiratory: Effort normal, non-labored breathing GI: Soft.  No distension. There is no tenderness.  Skin: WDI  Neuro: Mental Status: Patient is awake, alert, oriented to person, place, month, year, and situation. Patient is able to give a clear and coherent history. No signs of aphasia or neglect Cranial Nerves: II: Visual Fields are full. Pupils are equal, round, and reactive to light.   III,IV, VI: EOMI without ptosis or diploplia.  V: Facial sensation is symmetric to temperature VII: Facial movement with mild facial asymmetry, less elevation of smile on the right VIII: hearing is intact to voice X: Uvula elevates symmetrically XI: Shoulder shrug is symmetric. XII: tongue is midline without atrophy or fasciculations.  Motor: Tone is normal. Bulk is normal. 5/5 strength was present in bilateral legs and left arm, she has mild distal greater than proximal 4+/5 strength the right arm with discoordination of the right hand Sensory: Sensation is symmetric to light touch and temperature in the arms and legs. Cerebellar: FNF and HKS are  intact bilaterally   I have reviewed labs in epic and the results pertinent to this consultation are: CMP- mildly elevated creatinine  I have reviewed the images obtained: CT/CTA-multifocal intracranial stenosis with clear acute infarct  Impression: 73 year old female with atherosclerotic disease who presents with right hand and facial weakness.  Strongly suspect that she has had a small stroke, possibly due to intracranial atherosclerosis.  I would favor dual antiplatelet therapy in the short-term with aspirin and Plavix, evaluation for modification of her throat risk factors.  Recommendations: - HgbA1c,  fasting lipid panel - MRI  of the brain without contrast - Frequent neuro checks - Echocardiogram - Prophylactic therapy-Antiplatelet med: Aspirin 81 mg daily plus Plavix 75 mg daily following 30 mg load - Risk factor modification - Telemetry monitoring - PT consult, OT consult, Speech consult - Stroke team to follow    Ritta Slot, MD [facility] [phone]  If 7pm- 7am, please page neurology on call as listed in AMION.

## 2018-07-28 NOTE — H&P (Addendum)
Date: 07/28/2018               Patient Name:  Jasmine Dawson MRN: [id number]  DOB: 1945-07-02 Age / Sex: 73 y.o., female   PCP: Leilani Able, MD         Medical Service: Internal Medicine Teaching Service         Attending Physician: Dr. Earl Lagos, MD    First Contact: Dr. Dortha Schwalbe Pager: [phone]  Second Contact: Dr. Alinda Money Pager: [phone]       After Hours (After 5p/  First Contact Pager: [phone]  weekends / holidays): Second Contact Pager: [phone]   Chief Complaint: right hand weakness  History of Present Illness: Jasmine Dawson is a 73 yo female with a medical history of HTN, CKDIII, paroxysmal SVT, and inherited thrombophilia who presented for right hand weakness. She is right-hand dominant. She reports that three days ago she noticed that her right hand felt weak and she was unable to grip things. She also had tingling and a "drawing" sensation on the left side of her face. Her daughter told her that her speech didn't make any sense and she felt somewhat confused. Since then, her symptoms have improved. Her face and speech now feel normal, and her hand weakness is still present, but is better. She reports some lightheadedness and bilateral leg weakness with walking. She denies vision changes, chest pain, dyspnea, leg swelling.   Jasmine Dawson reports that her blood pressure is usually in the 180s systolic. She states that her PCP added clonidine to her antihypertensive regimen last Friday. She has had a headache since starting that medication. When she developed symptoms on Sunday, she stopped taking the clonidine thinking that it was responsible. She reports good compliance with her atenolol. She has not noticed any heart palpitations or fluttering recently.   Upon arrival to the ED, she was afebrile, bradycardic to the 40s, and hypertensive to 211/71. Lab work was unremarkable except for baseline kidney dysfunction with Cr of 1.37. EKG was sinus bradycardia without ischemic  changes. CT head was negative for acute findings. 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 She was unsteady with ambulation for the EDP.   Meds:  No current facility-administered medications on file prior to encounter.    Current Outpatient Medications on File Prior to Encounter  Medication Sig Dispense Refill  . atenolol (TENORMIN) 50 MG tablet Take 1 tablet (50 mg total) by mouth daily. 90 tablet 3  . cloNIDine (CATAPRES) 0.1 MG tablet Take 0.1 mg by mouth daily.    . clorazepate (TRANXENE) 7.5 MG tablet Take 7.5 mg by mouth 2 (two) times daily as needed for anxiety.    [name] COENZYME Q10 PO Take 300 mg by mouth daily.    . famotidine (PEPCID) 20 MG tablet Take 1 tablet (20 mg total) by mouth 2 (two) times daily. 30 tablet 0  . meclizine (ANTIVERT) 25 MG tablet Take 25 mg by mouth 3 (three) times daily as needed for dizziness.  1  . triamterene-hydrochlorothiazide (DYAZIDE) 37.5-25 MG capsule Take 1 capsule by mouth daily as needed (fluid).       Allergies: Allergies as of 07/28/2018 - Review Complete 07/28/2018  Allergen Reaction Noted  . Prednisone Anaphylaxis, Hives, and Itching 09/10/2011  . Sulfa antibiotics Anaphylaxis, Hives, and Itching 09/10/2011  . Caffeine Other (See Comments) 10/23/2011  . Eggs or egg-derived products Swelling 09/10/2011  . Influenza vaccines  09/11/2011  . Other Hives, Itching, Nausea And  Vomiting, Swelling, Rash, and Other (See Comments) 09/10/2011  . Shellfish allergy Itching, Nausea And Vomiting, and Rash 10/23/2011   Past Medical History:  Diagnosis Date  . CKD (chronic kidney disease), stage III (HCC)   . Dysrhythmia    occasional PVCs  . Heart palpitations   . Hyperlipidemia   . Hypertension   . Motion sickness   . Pulmonary hypertension (HCC)    a. 2D echo 11/01/17 showed EF 65-70%, grade 1 DD, mod-severe TR, moderately increased PASP .  [name] SVT (supraventricular tachycardia) (HCC)   .  Tricuspid regurgitation    Past Surgical History:  Procedure Laterality Date  . HYSTEROSCOPY W/D&C  11/07/2011   Procedure: DILATATION AND CURETTAGE /HYSTEROSCOPY;  Surgeon: Antionette Char, MD;  Location: [facility];  Service: Gynecology;  Laterality: N/A;  endometrial polypectomy  . TUBAL LIGATION  1977    Family History:  Family History  Problem Relation Age of Onset  . Diabetes Mother        died at 52  . Heart attack Mother   . Heart attack Father        died at 75   Social History: Lives at home with her husband and daughter. Independent in all ADLs. Never smoker. Denies etoh or illicit drug use.   Review of Systems: A complete ROS was negative except as per HPI.   Physical Exam: Blood pressure (!) 204/69, pulse (!) 49, temperature 98.2 F (36.8 C), temperature source Oral, resp. rate 18, height 5\' 4"  (1.626 m), weight 84.2 kg, SpO2 100 %.  Constitutional: Well-developed, well-nourished, and in no distress.  Neuro: Alert and oriented x3. No dysarthria or aphasia. Cranial nerves II-XII intact. 4/5 strength in the right hand. 5/5 strength everywhere else. Normal sensation to touch throughout. No tremor or dysmetria on finger-to-nose. Cardiovascular: Bradycardic, regular rhythm. No murmurs, rubs, or gallops. Pulmonary/Chest: Effort normal. Clear to auscultation bilaterally. No wheezes, rales, or rhonchi. Abdominal: Bowel sounds present. Soft, non-distended, non-tender. Ext: No lower extremity edema. Skin: Warm and dry. No rashes or wounds.  EKG: personally reviewed my interpretation is sinus bradycardia without ischemic changes.  Brain MRI  1. Acute small LEFT frontoparietal/MCA territory nonhemorrhagic infarcts. 2. Moderate to severe chronic small vessel ischemic changes.  CTA head and neck 1. Negative CTA for large vessel occlusion. 2. Multifocal moderate to severe distal left M1/proximal M2 stenoses. 3. Carotid siphon atherosclerotic change with resultant mild to  moderate multifocal stenoses (up to approximately 50%). 4. Distal small vessel atheromatous irregularity throughout the intracranial arterial circulation. 5. Wide patency of the major arterial vasculature of the neck.  Assessment & Plan by Problem: Active Problems:   Weakness  Ms. Schrieber is a 73 yo female with a medical history of HTN, HLD, CKDIII, paroxysmal SVT, and inherited thrombophilia who presented three days after the development of right hand weakness, dysarthria, and left face tingling. Upon arrival to the hospital, her symptoms had mostly resolved, but she has persistent mild right hand weakness and ataxia. Brain MRI showed acute small left frontoparietal/MCA territory infarcts.   CVA - Her only current neurological deficit is 4/5 strength in the right hand. - Risk factors include uncontrolled HTN, paroxysmal SVT (although patient has not noticed palpitations and is currently in sinus), and inherited thrombophilia (without history of clots or anticoagulation). - Head CT without acute findings. Head and neck CTA without large vessel occlusion, moderate to severe distal left M1/proximal M2 stenoses.  - Passed swallow eval, diet provided. Plan - Neurology consulted, appreciate recs -  Start aspirin  - ECHO - Permissive HTN for 24-48 hrs - Lipid panel, A1c - PT/OT  HTN - Hypertensive to the 210s systolic. Reports compliance with home atenolol. Took two days of clonidine, which was recently added to her medication regimen, but stopped when she developed neuro symptoms.   Plan - Holding home atenolol and clonidine for permissive HTN  Paroxysmal SVT - Currently in sinus bradycardia. Perhaps bradycardic from atenolol. - Holding home atenolol for permissive HTN - Tele  Inherited thrombophilia - Evaluated by hematology 05/14/2018 for positive factor V and prothrombin gene mutations. No anticoagulation recommended unless she develops a blood clot. Also recommended aggressive DVT  ppx after surgical procedure or immobilization.  - Lovenox for DVT ppx while hospitalized   CKDIII: Cr 1.37. Kidney function is at baseline.  FEN: no IV fluids, regular diet, replace electrolytes as needed  DVT ppx: Lovenox Code status: FULL code  Dispo: Admit patient to Inpatient with expected length of stay greater than 2 midnights.  Signed: Dionne Anoorrell,  N, MD 07/28/2018, 10:01 PM  Pager: [phone]

## 2018-07-28 NOTE — ED Provider Notes (Signed)
[facility]   [phone] 07/28/18 Arrival Time: 1437  ASSESSMENT & PLAN:  1. Aphasia   2. Right arm weakness    Concern for possible CVA at some point over this past week with sequelae. Gradual onset of current symptoms without worsening or progression. To ED for further evaluation and imaging. Discussed.  Reviewed expectations re: course of current medical issues. Questions answered. Outlined signs and symptoms indicating need for more acute intervention. Patient verbalized understanding. After Visit Summary given.   SUBJECTIVE:  Poor historian. History from patient and her husband.  Jasmine Dawson is a 73 y.o. female with significant PMH (as below) who was seen in the ED on 07/20/2018 for a near syncopal episode; notes reviewed. Noted and significant HTN. Taking medications as directed. Reports she still experiences episodes of dizziness that she describes as a lightheaded feeling. Also reports several days of R arm weakness; "hard to move my right arm"; now fairly persistent. She is R-handed. Unable to sign her name upon checking in here. No specific sensation changes of extremities. She also reports two or three transient episodes of aphasia over the past several days; "couldn't get my words out". Occasional mild and generalized headache. In addition, her husband reports that she told him the L side of her face felt numb at some point. Ambulatory but feels unsteady. Normal bowel/bladder habit changes. No recent illnesses. No new medications. Her husband reports no significant confusion or mental status changes; "maybe a little forgetful at times". No specific aggravating or alleviating factors reported concerning current symptoms. No head trauma. Reports normal vision and hearing.  ROS: As per HPI. All other systems negative.   OBJECTIVE:  Vitals:   07/28/18 1448  BP: (!) 207/83  Pulse: (!) 55  Resp: 16  Temp: 97.7 F (36.5 C)  TempSrc: Oral  SpO2: 98%     Elevated BP and bradycardia noted; past measurements 50-65  General appearance: alert; no distress Eyes: PERRLA; EOMI; conjunctiva normal HENT: normocephalic; atraumatic; oral mucosa normal Neck: supple with FROM Lungs: clear to auscultation bilaterally Heart: bradycardia; regular Abdomen: soft, non-tender Extremities: symmetrical with no gross deformities Skin: warm and dry Neurologic: overall normal gait but walks slowly and is a little unsteady; normal sensation reported of upper extremities and lower extremities; RUE weakness when compared to LUE, slight pronator drift of RUE; weak grip; CN 2-12 grossly intact; speech appears normal; unable to locate reflex hammer to test reflexes adequately Psychological: alert and cooperative; normal mood and affect  Allergies  Allergen Reactions  . Prednisone Anaphylaxis, Hives and Itching  . Sulfa Antibiotics Anaphylaxis, Hives and Itching  . Caffeine Other (See Comments)    Heart flutters  . Eggs Or Egg-Derived Products Swelling    Throat swelling  . Influenza Vaccines   . Other Hives, Itching, Nausea And Vomiting, Swelling, Rash and Other (See Comments)    High acid foods. Tomatoes, oranges- Throat swelling [place] food Artificial Sweetners- heart flutters  . Shellfish Allergy Itching, Nausea And Vomiting and Rash    Past Medical History:  Diagnosis Date  . CKD (chronic kidney disease), stage III (HCC)   . Dysrhythmia    occasional PVCs  . Heart palpitations   . Hyperlipidemia   . Hypertension   . Motion sickness   . Pulmonary hypertension (HCC)    a. 2D echo 11/01/17 showed EF 65-70%, grade 1 DD, mod-severe TR, moderately increased PASP .  [name] SVT (supraventricular tachycardia) (HCC)   . Tricuspid regurgitation    Social  History   Socioeconomic History  . Marital status: Married    Spouse name: Not on file  . Number of children: Not on file  . Years of education: Not on file  . Highest education level: Not on file   Occupational History  . Not on file  Social Needs  . Financial resource strain: Not on file  . Food insecurity:    Worry: Not on file    Inability: Not on file  . Transportation needs:    Medical: Not on file    Non-medical: Not on file  Tobacco Use  . Smoking status: Never Smoker  . Smokeless tobacco: Never Used  Substance and Sexual Activity  . Alcohol use: No  . Drug use: No  . Sexual activity: Not Currently  Lifestyle  . Physical activity:    Days per week: Not on file    Minutes per session: Not on file  . Stress: Not on file  Relationships  . Social connections:    Talks on phone: Not on file    Gets together: Not on file    Attends religious service: Not on file    Active member of club or organization: Not on file    Attends meetings of clubs or organizations: Not on file    Relationship status: Not on file  . Intimate partner violence:    Fear of current or ex partner: Not on file    Emotionally abused: Not on file    Physically abused: Not on file    Forced sexual activity: Not on file  Other Topics Concern  . Not on file  Social History Narrative   Did daycare as a younger lady.  Retired at [phone]             Family History  Problem Relation Age of Onset  . Diabetes Mother        died at 15  . Heart attack Mother   . Heart attack Father        died at 20   Past Surgical History:  Procedure Laterality Date  . HYSTEROSCOPY W/D&C  11/07/2011   Procedure: DILATATION AND CURETTAGE /HYSTEROSCOPY;  Surgeon: Antionette Char, MD;  Location: [facility];  Service: Gynecology;  Laterality: N/A;  endometrial polypectomy  . TUBAL LIGATION  1977      Mardella Layman, MD 07/28/18 1606

## 2018-07-28 NOTE — ED Triage Notes (Signed)
Pt presents for eval of hypertension, states her home readings run in the 180's systolic, states she takes her medication as prescribed. States her doctor recently added a BP med, and she has had a headache ever since starting on it recently.  Endorses right hand weakness and intermittent aphasia x2 days. Endorses slight problem with gait, states she feels unsteady at times. Denies NVD/CP/cough/fever/chills/trouble with vision. Pt A+Ox4, in NAD on arrival.

## 2018-07-28 NOTE — ED Notes (Signed)
Admitting at bedside 

## 2018-07-28 NOTE — ED Notes (Signed)
ED TO INPATIENT HANDOFF REPORT  ED Nurse Name and Phone #: Leafy Kindle [phone]  S Name/Age/Gender Jasmine Dawson 73 y.o. female Room/Bed: 034C/034C  Code Status   Code Status: Prior  Home/SNF/Other Home Patient oriented to: self, place, time and situation Is this baseline? Yes   Triage Complete: Triage complete  Chief Complaint Stroke sx, x1 wk, Aphasia, R arm weaknes  Triage Note Pt presents for eval of hypertension, states her home readings run in the 180's systolic, states she takes her medication as prescribed. States her doctor recently added a BP med, and she has had a headache ever since starting on it recently.  Endorses right hand weakness and intermittent aphasia x2 days. Endorses slight problem with gait, states she feels unsteady at times. Denies NVD/CP/cough/fever/chills/trouble with vision. Pt A+Ox4, in NAD on arrival.    Allergies Allergies  Allergen Reactions  . Prednisone Anaphylaxis, Hives and Itching  . Sulfa Antibiotics Anaphylaxis, Hives and Itching  . Caffeine Other (See Comments)    Heart flutters  . Eggs Or Egg-Derived Products Swelling    Throat swelling  . Influenza Vaccines   . Other Hives, Itching, Nausea And Vomiting, Swelling, Rash and Other (See Comments)    High acid foods. Tomatoes, oranges- Throat swelling [place] food Artificial Sweetners- heart flutters  . Shellfish Allergy Itching, Nausea And Vomiting and Rash    Level of Care/Admitting Diagnosis ED Disposition    ED Disposition Condition Comment   Admit  Hospital Area: [facility] [[id number]]  Level of Care: Telemetry Medical [104]  Diagnosis: Weakness [241835]  Admitting Physician: Earl Lagos [[id number]]  Attending Physician: Earl Lagos [phone]  Estimated length of stay: past midnight tomorrow  Certification:: I certify this patient will need inpatient services for at least 2 midnights  PT Class (Do Not Modify): Inpatient [101]  PT Acc Code (Do Not  Modify): Private [1]       B Medical/Surgery History Past Medical History:  Diagnosis Date  . CKD (chronic kidney disease), stage III (HCC)   . Dysrhythmia    occasional PVCs  . Heart palpitations   . Hyperlipidemia   . Hypertension   . Motion sickness   . Pulmonary hypertension (HCC)    a. 2D echo 11/01/17 showed EF 65-70%, grade 1 DD, mod-severe TR, moderately increased PASP .  [name] SVT (supraventricular tachycardia) (HCC)   . Tricuspid regurgitation    Past Surgical History:  Procedure Laterality Date  . HYSTEROSCOPY W/D&C  11/07/2011   Procedure: DILATATION AND CURETTAGE /HYSTEROSCOPY;  Surgeon: Antionette Char, MD;  Location: [facility];  Service: Gynecology;  Laterality: N/A;  endometrial polypectomy  . TUBAL LIGATION  1977     A IV Location/Drains/Wounds Patient Lines/Drains/Airways Status   Active Line/Drains/Airways    Name:   Placement date:   Placement time:   Site:   Days:   Peripheral IV 07/28/18 Left Antecubital   07/28/18    1838    Antecubital   less than 1   Peripheral IV 07/28/18 Right Wrist   07/28/18    1630    Wrist   less than 1   Incision 11/07/11 Perineum   11/07/11    1448     2455          Intake/Output Last 24 hours No intake or output data in the 24 hours ending 07/28/18 2056  Labs/Imaging Results for orders placed or performed during the hospital encounter of 07/28/18 (from the past 48 hour(s))  Protime-INR  Status: None   Collection Time: 07/28/18  5:23 PM  Result Value Ref Range   Prothrombin Time 13.1 11.4 - 15.2 seconds   INR 1.0 0.8 - 1.2    Comment: (NOTE) INR goal varies based on device and disease states. Performed at [facility], [address], [place], [place] [address]   APTT     Status: None   Collection Time: 07/28/18  5:23 PM  Result Value Ref Range   aPTT 32 24 - 36 seconds    Comment: Performed at [facility], [address] [place], [place] [address]  CBC     Status: None   Collection Time:  07/28/18  5:23 PM  Result Value Ref Range   WBC 7.1 4.0 - 10.5 K/uL   RBC 4.57 3.87 - 5.11 MIL/uL   Hemoglobin 12.9 12.0 - 15.0 g/dL   HCT 18.3 43.7 - 35.7 %   MCV 89.9 80.0 - 100.0 fL   MCH 28.2 26.0 - 34.0 pg   MCHC 31.4 30.0 - 36.0 g/dL   RDW 89.7 84.7 - 84.1 %   Platelets 238 150 - 400 K/uL   nRBC 0.0 0.0 - 0.2 %    Comment: Performed at [facility], [address] [place], [place] [address]  Differential     Status: None   Collection Time: 07/28/18  5:23 PM  Result Value Ref Range   Neutrophils Relative % 61 %   Neutro Abs 4.3 1.7 - 7.7 K/uL   Lymphocytes Relative 32 %   Lymphs Abs 2.2 0.7 - 4.0 K/uL   Monocytes Relative 6 %   Monocytes Absolute 0.4 0.1 - 1.0 K/uL   Eosinophils Relative 1 %   Eosinophils Absolute 0.1 0.0 - 0.5 K/uL   Basophils Relative 0 %   Basophils Absolute 0.0 0.0 - 0.1 K/uL   Immature Granulocytes 0 %   Abs Immature Granulocytes 0.02 0.00 - 0.07 K/uL    Comment: Performed at [facility], [address], [place], [place] [address]  Comprehensive metabolic panel     Status: Abnormal   Collection Time: 07/28/18  5:23 PM  Result Value Ref Range   Sodium 139 135 - 145 mmol/L   Potassium 3.6 3.5 - 5.1 mmol/L   Chloride 107 98 - 111 mmol/L   CO2 25 22 - 32 mmol/L   Glucose, Bld 94 70 - 99 mg/dL   BUN 15 8 - 23 mg/dL   Creatinine, Ser 1.95 (H) 0.44 - 1.00 mg/dL   Calcium 9.3 8.9 - 97.4 mg/dL   Total Protein 7.2 6.5 - 8.1 g/dL   Albumin 3.8 3.5 - 5.0 g/dL   AST 32 15 - 41 U/L   ALT 17 0 - 44 U/L   Alkaline Phosphatase 69 38 - 126 U/L   Total Bilirubin 0.5 0.3 - 1.2 mg/dL   GFR calc non Af Amer 38 (L) >60 mL/min   GFR calc Af Amer 44 (L) >60 mL/min   Anion gap 7 5 - 15    Comment: Performed at [facility], [address], [place], [place] [address]  Urinalysis, Routine w reflex microscopic     Status: Abnormal   Collection Time: 07/28/18  8:01 PM  Result Value Ref Range   Color, Urine STRAW (A) YELLOW   APPearance CLEAR  CLEAR   Specific Gravity, Urine 1.008 1.005 - 1.030   pH 5.0 5.0 - 8.0   Glucose, UA NEGATIVE  NEGATIVE mg/dL   Hgb urine dipstick NEGATIVE NEGATIVE   Bilirubin Urine NEGATIVE NEGATIVE   Ketones, ur NEGATIVE NEGATIVE mg/dL   Protein, ur NEGATIVE NEGATIVE mg/dL   Nitrite NEGATIVE NEGATIVE   Leukocytes,Ua NEGATIVE NEGATIVE    Comment: Performed at [facility], [address], [place], [place] [address]   Ct Head Wo Contrast  Result Date: 07/28/2018 CLINICAL DATA:  Headache. EXAM: CT HEAD WITHOUT CONTRAST TECHNIQUE: Contiguous axial images were obtained from the base of the skull through the vertex without intravenous contrast. COMPARISON:  CT scan of Sep 24, 2016. FINDINGS: Brain: Mild chronic ischemic white matter disease is noted. No mass effect or midline shift is noted. Ventricular size is within normal limits. There is no evidence of mass lesion, hemorrhage or acute infarction. Vascular: No hyperdense vessel or unexpected calcification. Skull: Normal. Negative for fracture or focal lesion. Sinuses/Orbits: No acute finding. Other: None. IMPRESSION: Mild chronic ischemic white matter disease. No acute intracranial abnormality seen. Electronically Signed   By: Lupita Raider, M.D.   On: 07/28/2018 17:18    Pending Labs Unresulted Labs (From admission, onward)   None      Vitals/Pain Today's Vitals   07/28/18 1730 07/28/18 1745 07/28/18 1800 07/28/18 1815  BP: (!) 153/74 (!) 200/65 (!) 202/64 (!) 190/68  Pulse: (!) 50 (!) 49 (!) 47 (!) 49  Resp: Temp:      TempSrc:      SpO2: 100% 99% 100% 98%  PainSc:        Isolation Precautions No active isolations  Medications Medications  iohexol (OMNIPAQUE) 350 MG/ML injection 100 mL (100 mLs Intravenous Contrast Given 07/28/18 2040)    Mobility walks Moderate fall risk   Focused Assessments Neuro Assessment Handoff:  Swallow screen pass? Yes  Cardiac Rhythm: Sinus bradycardia NIH Stroke Scale ( + Modified  Stroke Scale Criteria)  Interval: Initial Level of Consciousness (1a.)   : Alert, keenly responsive LOC Questions (1b. )   +: Answers both questions correctly LOC Commands (1c. )   + : Performs both tasks correctly Best Gaze (2. )  +: Normal Visual (3. )  +: No visual loss Facial Palsy (4. )    : Normal symmetrical movements Motor Arm, Left (5a. )   +: No drift Motor Arm, Right (5b. )   +: No drift Motor Leg, Left (6a. )   +: No drift Motor Leg, Right (6b. )   +: No drift Limb Ataxia (7. ): Absent Sensory (8. )   +: Normal, no sensory loss Best Language (9. )   +: No aphasia(pt endorses intermittent aphasia x2 days, none present on assesment) Dysarthria (10. ): Normal Extinction/Inattention (11.)   +: No Abnormality Modified SS Total  +: 0 Complete NIHSS TOTAL: 0     Neuro Assessment: Exceptions to WDL Neuro Checks:   Initial (07/28/18 1543)  Last Documented NIHSS Modified Score: 0 (07/28/18 1543) Has TPA been given? No If patient is a Neuro Trauma and patient is going to OR before floor call report to 4N Charge nurse: [phone] or [phone]     R Recommendations: See Admitting Provider Note  Report given to:   Additional Notes:

## 2018-07-29 ENCOUNTER — Inpatient Hospital Stay (HOSPITAL_COMMUNITY): Payer: Medicare Other

## 2018-07-29 DIAGNOSIS — E785 Hyperlipidemia, unspecified: Secondary | ICD-10-CM

## 2018-07-29 DIAGNOSIS — I700 Atherosclerosis of aorta: Secondary | ICD-10-CM

## 2018-07-29 DIAGNOSIS — Z881 Allergy status to other antibiotic agents status: Secondary | ICD-10-CM

## 2018-07-29 DIAGNOSIS — I129 Hypertensive chronic kidney disease with stage 1 through stage 4 chronic kidney disease, or unspecified chronic kidney disease: Secondary | ICD-10-CM

## 2018-07-29 DIAGNOSIS — I471 Supraventricular tachycardia: Secondary | ICD-10-CM

## 2018-07-29 DIAGNOSIS — G8321 Monoplegia of upper limb affecting right dominant side: Secondary | ICD-10-CM

## 2018-07-29 DIAGNOSIS — I340 Nonrheumatic mitral (valve) insufficiency: Secondary | ICD-10-CM

## 2018-07-29 DIAGNOSIS — Z91048 Other nonmedicinal substance allergy status: Secondary | ICD-10-CM

## 2018-07-29 DIAGNOSIS — I63512 Cerebral infarction due to unspecified occlusion or stenosis of left middle cerebral artery: Secondary | ICD-10-CM

## 2018-07-29 DIAGNOSIS — N183 Chronic kidney disease, stage 3 (moderate): Secondary | ICD-10-CM

## 2018-07-29 DIAGNOSIS — D6859 Other primary thrombophilia: Secondary | ICD-10-CM

## 2018-07-29 DIAGNOSIS — Z888 Allergy status to other drugs, medicaments and biological substances status: Secondary | ICD-10-CM

## 2018-07-29 DIAGNOSIS — I351 Nonrheumatic aortic (valve) insufficiency: Secondary | ICD-10-CM

## 2018-07-29 DIAGNOSIS — Z91012 Allergy to eggs: Secondary | ICD-10-CM

## 2018-07-29 DIAGNOSIS — Z91013 Allergy to seafood: Secondary | ICD-10-CM

## 2018-07-29 DIAGNOSIS — Z887 Allergy status to serum and vaccine status: Secondary | ICD-10-CM

## 2018-07-29 DIAGNOSIS — Z79899 Other long term (current) drug therapy: Secondary | ICD-10-CM

## 2018-07-29 DIAGNOSIS — I6340 Cerebral infarction due to embolism of unspecified cerebral artery: Secondary | ICD-10-CM

## 2018-07-29 LAB — BASIC METABOLIC PANEL
Anion gap: 8 (ref 5–15)
BUN: 14 mg/dL (ref 8–23)
CO2: 23 mmol/L (ref 22–32)
Calcium: 9 mg/dL (ref 8.9–10.3)
Chloride: 107 mmol/L (ref 98–111)
Creatinine, Ser: 1.31 mg/dL — ABNORMAL HIGH (ref 0.44–1.00)
GFR calc Af Amer: 46 mL/min — ABNORMAL LOW (ref >60)
GFR calc non Af Amer: 40 mL/min — ABNORMAL LOW (ref >60)
Glucose, Bld: 93 mg/dL (ref 70–99)
Potassium: 3.3 mmol/L — ABNORMAL LOW (ref 3.5–5.1)
Sodium: 138 mmol/L (ref 135–145)

## 2018-07-29 LAB — LIPID PANEL
Cholesterol: 240 mg/dL — ABNORMAL HIGH (ref 0–200)
HDL: 60 mg/dL (ref >40)
LDL Cholesterol: 161 mg/dL — ABNORMAL HIGH (ref 0–99)
Total CHOL/HDL Ratio: 4 RATIO
Triglycerides: 93 mg/dL (ref <150)
VLDL: 19 mg/dL (ref 0–40)

## 2018-07-29 LAB — CBC
HCT: 39.4 % (ref 36.0–46.0)
Hemoglobin: 12.9 g/dL (ref 12.0–15.0)
MCH: 28.7 pg (ref 26.0–34.0)
MCHC: 32.7 g/dL (ref 30.0–36.0)
MCV: 87.6 fL (ref 80.0–100.0)
Platelets: 237 10*3/uL (ref 150–400)
RBC: 4.5 MIL/uL (ref 3.87–5.11)
RDW: 13.8 % (ref 11.5–15.5)
WBC: 6.6 10*3/uL (ref 4.0–10.5)
nRBC: 0 % (ref 0.0–0.2)

## 2018-07-29 LAB — ECHOCARDIOGRAM COMPLETE
Height: 64 in
Weight: 2970.04 oz

## 2018-07-29 LAB — HEMOGLOBIN A1C
Hgb A1c MFr Bld: 5.9 % — ABNORMAL HIGH (ref 4.8–5.6)
Mean Plasma Glucose: 122.63 mg/dL

## 2018-07-29 MED ORDER — ASPIRIN EC 81 MG PO TBEC
81.0000 mg | DELAYED_RELEASE_TABLET | Freq: Every day | ORAL | Status: DC
  Administered 2018-07-29 – 2018-07-31 (×3): 81 mg via ORAL
  Filled 2018-07-29 (×3): qty 1

## 2018-07-29 MED ORDER — FAMOTIDINE 20 MG PO TABS
20.0000 mg | ORAL_TABLET | Freq: Two times a day (BID) | ORAL | Status: DC
  Administered 2018-07-29 – 2018-07-31 (×5): 20 mg via ORAL
  Filled 2018-07-29 (×5): qty 1

## 2018-07-29 MED ORDER — CLOPIDOGREL BISULFATE 75 MG PO TABS
75.0000 mg | ORAL_TABLET | Freq: Every day | ORAL | Status: DC
  Administered 2018-07-30 – 2018-07-31 (×2): 75 mg via ORAL
  Filled 2018-07-29 (×2): qty 1

## 2018-07-29 MED ORDER — CLOPIDOGREL BISULFATE 300 MG PO TABS
300.0000 mg | ORAL_TABLET | Freq: Once | ORAL | Status: AC
  Administered 2018-07-29: 03:00:00 300 mg via ORAL
  Filled 2018-07-29: qty 1

## 2018-07-29 MED ORDER — POTASSIUM CHLORIDE CRYS ER 20 MEQ PO TBCR
20.0000 meq | EXTENDED_RELEASE_TABLET | Freq: Two times a day (BID) | ORAL | Status: AC
  Administered 2018-07-29 – 2018-07-30 (×4): 20 meq via ORAL
  Filled 2018-07-29 (×4): qty 1

## 2018-07-29 NOTE — Progress Notes (Signed)
STROKE TEAM PROGRESS NOTE  INTERVAL HISTORY States she'll doing well. Her speech has improved. She still has some weakness in the right hand with lack of dexterity.  Vitals:   07/29/18 0400 07/29/18 0949 07/29/18 1210 07/29/18 1557  BP: (!) 173/63 (!) 189/73 (!) 178/77 (!) 189/74  Pulse: (!) 54 (!) 51 (!) 55 (!) 57  Resp:  16 17 15   Temp: 97.7 F (36.5 C) 98 F (36.7 C) 98.1 F (36.7 C) (!) 97.3 F (36.3 C)  TempSrc: Axillary Oral Oral Oral  SpO2: 96% 100% 99% 99%  Weight:      Height:        CBC:  Recent Labs  Lab 07/28/18 1723 07/29/18 0553  WBC 7.1 6.6  NEUTROABS 4.3  --   HGB 12.9 12.9  HCT 41.1 39.4  MCV 89.9 87.6  PLT 238 237    Basic Metabolic Panel:  Recent Labs  Lab 07/28/18 1723 07/29/18 0553  NA 139 138  K 3.6 3.3*  CL 107 107  CO2 25 23  GLUCOSE 94 93  BUN 15 14  CREATININE 1.37* 1.31*  CALCIUM 9.3 9.0   Lipid Panel:     Component Value Date/Time   CHOL 240 (H) 07/29/2018 0553   TRIG 93 07/29/2018 0553   HDL 60 07/29/2018 0553   CHOLHDL 4.0 07/29/2018 0553   VLDL 19 07/29/2018 0553   LDLCALC 161 (H) 07/29/2018 0553   HgbA1c:  Lab Results  Component Value Date   HGBA1C 5.9 (H) 07/29/2018   Urine Drug Screen: No results found for: LABOPIA, COCAINSCRNUR, LABBENZ, AMPHETMU, THCU, LABBARB  Alcohol Level No results found for: ETH  IMAGING Ct Angio Head W Or Wo Contrast  Result Date: 07/28/2018 CLINICAL DATA:  Initial evaluation for near syncope, dizziness. EXAM: CT ANGIOGRAPHY HEAD AND NECK TECHNIQUE: Multidetector CT imaging of the head and neck was performed using the standard protocol during bolus administration of intravenous contrast. Multiplanar CT image reconstructions and MIPs were obtained to evaluate the vascular anatomy. Carotid stenosis measurements (when applicable) are obtained utilizing NASCET criteria, using the distal internal carotid diameter as the denominator. CONTRAST:  OMNIPAQUE IOHEXOL 350 MG/ML SOLN COMPARISON:   Prior CT from earlier the same day. FINDINGS: CTA NECK FINDINGS Aortic arch: Visualized aortic arch of normal caliber with normal 3 vessel morphology. Mild atheromatous plaque at the origin of the left CCA without significant stenosis. No hemodynamically significant stenosis about the origin of the great vessels. Visualized subclavian arteries widely patent. Right carotid system: Right CCA patent from its origin to the bifurcation without stenosis. No significant atheromatous narrowing about the right bifurcation. Right ICA widely patent distally to the skull base without stenosis, dissection, or occlusion. Left carotid system: Left common carotid artery patent from its origin to the bifurcation without stenosis. Mild scattered calcified plaque about the left bifurcation without hemodynamically significant stenosis. Left ICA widely patent to the skull base without stenosis, dissection, or occlusion. Vertebral arteries: Both of the vertebral arteries arise from the subclavian arteries. Vertebral arteries widely patent within the neck without stenosis, dissection, or occlusion. Skeleton: No acute osseous finding. No discrete lytic or blastic osseous lesions. Trace retrolisthesis of C5 on C6, likely chronic and degenerative. Other neck: No acute soft tissue abnormality within the neck. 1 cm hypodense right thyroid nodule noted, of doubtful significance given size and patient age. No adenopathy. Upper chest: Visualized upper chest demonstrates no acute finding. Review of the MIP images confirms the above findings CTA HEAD FINDINGS Anterior  circulation: Petrous segments widely patent bilaterally. Scattered atheromatous plaque within the cavernous/supraclinoid ICAs bilaterally with mild to moderate multifocal narrowing (no more than 50%). Termini patent bilaterally. A1 segments well perfused. Normal anterior communicating artery. Anterior cerebral arteries patent to their distal aspects without stenosis. Right M1 widely  patent. Multifocal moderate to severe distal left M1/proximal M2 stenoses (series 11, image 48). Distal MCA branches well perfused and symmetric. Distal small vessel atheromatous irregularity. Posterior circulation: Vertebral arteries patent to the vertebrobasilar junction without stenosis. Posterior inferior cerebral arteries patent bilaterally. Basilar patent to its distal aspect without stenosis. Superior cerebral arteries patent bilaterally. Both of the posterior cerebral arteries primarily supplied via the basilar. PCAs are widely patent to their distal aspects. Distal small vessel atheromatous irregularity. Venous sinuses: Patent. Anatomic variants: None significant. Delayed phase: No abnormal enhancement. Review of the MIP images confirms the above findings IMPRESSION: 1. Negative CTA for large vessel occlusion. 2. Multifocal moderate to severe distal left M1/proximal M2 stenoses. 3. 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 4. Distal small vessel atheromatous irregularity throughout the intracranial arterial circulation. 5. Wide patency of the major arterial vasculature of the neck. Electronically Signed   By: Rise Mu M.D.   On: 07/28/2018 21:17   Ct Head Wo Contrast  Result Date: 07/28/2018 CLINICAL DATA:  Headache. EXAM: CT HEAD WITHOUT CONTRAST TECHNIQUE: Contiguous axial images were obtained from the base of the skull through the vertex without intravenous contrast. COMPARISON:  CT scan of Sep 24, 2016. FINDINGS: Brain: Mild chronic ischemic white matter disease is noted. No mass effect or midline shift is noted. Ventricular size is within normal limits. There is no evidence of mass lesion, hemorrhage or acute infarction. Vascular: No hyperdense vessel or unexpected calcification. Skull: Normal. Negative for fracture or focal lesion. Sinuses/Orbits: No acute finding. Other: None. IMPRESSION: Mild chronic ischemic  white matter disease. No acute intracranial abnormality seen. Electronically Signed   By: Lupita Raider, M.D.   On: 07/28/2018 17:18   Ct Angio Neck W And/or Wo Contrast  Result Date: 07/28/2018 CLINICAL DATA:  Initial evaluation for near syncope, dizziness. EXAM: CT ANGIOGRAPHY HEAD AND NECK TECHNIQUE: Multidetector CT imaging of the head and neck was performed using the standard protocol during bolus administration of intravenous contrast. Multiplanar CT image reconstructions and MIPs were obtained to evaluate the vascular anatomy. Carotid stenosis measurements (when applicable) are obtained utilizing NASCET criteria, using the distal internal carotid diameter as the denominator. CONTRAST:  OMNIPAQUE IOHEXOL 350 MG/ML SOLN COMPARISON:  Prior CT from earlier the same day. FINDINGS: CTA NECK FINDINGS Aortic arch: Visualized aortic arch of normal caliber with normal 3 vessel morphology. Mild atheromatous plaque at the origin of the left CCA without significant stenosis. No hemodynamically significant stenosis about the origin of the great vessels. Visualized subclavian arteries widely patent. Right carotid system: Right CCA patent from its origin to the bifurcation without stenosis. No significant atheromatous narrowing about the right bifurcation. Right ICA widely patent distally to the skull base without stenosis, dissection, or occlusion. Left carotid system: Left common carotid artery patent from its origin to the bifurcation without stenosis. Mild scattered calcified plaque about the left bifurcation without hemodynamically significant stenosis. Left ICA widely patent to the skull base without stenosis, dissection, or occlusion. Vertebral arteries: Both of the vertebral arteries arise from the subclavian arteries. Vertebral arteries widely patent within the neck without stenosis, dissection, or occlusion. Skeleton: No acute osseous finding. No  discrete lytic or blastic osseous lesions. Trace  retrolisthesis of C5 on C6, likely chronic and degenerative. Other neck: No acute soft tissue abnormality within the neck. 1 cm hypodense right thyroid nodule noted, of doubtful significance given size and patient age. No adenopathy. Upper chest: Visualized upper chest demonstrates no acute finding. Review of the MIP images confirms the above findings CTA HEAD FINDINGS Anterior circulation: Petrous segments widely patent bilaterally. Scattered atheromatous plaque within the cavernous/supraclinoid ICAs bilaterally with mild to moderate multifocal narrowing (no more than 50%). Termini patent bilaterally. A1 segments well perfused. Normal anterior communicating artery. Anterior cerebral arteries patent to their distal aspects without stenosis. Right M1 widely patent. Multifocal moderate to severe distal left M1/proximal M2 stenoses (series 11, image 48). Distal MCA branches well perfused and symmetric. Distal small vessel atheromatous irregularity. Posterior circulation: Vertebral arteries patent to the vertebrobasilar junction without stenosis. Posterior inferior cerebral arteries patent bilaterally. Basilar patent to its distal aspect without stenosis. Superior cerebral arteries patent bilaterally. Both of the posterior cerebral arteries primarily supplied via the basilar. PCAs are widely patent to their distal aspects. Distal small vessel atheromatous irregularity. Venous sinuses: Patent. Anatomic variants: None significant. Delayed phase: No abnormal enhancement. Review of the MIP images confirms the above findings IMPRESSION: 1. Negative CTA for large vessel occlusion. 2. Multifocal moderate to severe distal left M1/proximal M2 stenoses. 3. 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 4. Distal small vessel atheromatous irregularity throughout the intracranial arterial circulation. 5. Wide patency of the major arterial vasculature of the neck.  Electronically Signed   By: Rise Mu M.D.   On: 07/28/2018 21:17   Mr Brain Wo Contrast  Result Date: 07/29/2018 CLINICAL DATA:  RIGHT hand weakness, speech difficulties and confusion for 3 days. Suspect stroke. History of hypertension and hyperlipidemia. EXAM: MRI HEAD WITHOUT CONTRAST TECHNIQUE: Multiplanar, multiecho pulse sequences of the brain and surrounding structures were obtained without intravenous contrast. COMPARISON:  CT HEAD July 28, 2015 FINDINGS: INTRACRANIAL CONTENTS: Scattered subcentimeter reduced diffusion LEFT frontoparietal lobes with low ADC values. No susceptibility artifact to suggest hemorrhage. RIGHT parietal developmental venous anomaly. Patchy to confluent supratentorial and pontine white matter FLAIR T2 hyperintensities. No midline shift, mass effect or masses. No parenchymal brain volume loss for age. No abnormal extra-axial fluid collections. VASCULAR: Normal major intracranial vascular flow voids present at skull base. SKULL AND UPPER CERVICAL SPINE: No abnormal sellar expansion. No suspicious calvarial bone marrow signal. Craniocervical junction maintained. SINUSES/ORBITS: Small LEFT maxillary mucosal retention cyst. Paranasal sinuses are well aerated. Included ocular globes and orbital contents are non-suspicious. OTHER: None. IMPRESSION: 1. Acute small LEFT frontoparietal/MCA territory nonhemorrhagic infarcts. 2. Moderate to severe chronic small vessel ischemic changes. Electronically Signed   By: Awilda Metro M.D.   On: 07/29/2018 00:28   2D Echocardiogram   1. The left ventricle has normal systolic function with an ejection fraction of 60-65%. The cavity size was normal. There is moderately increased left ventricular wall thickness. Left ventricular diastolic Doppler parameters are consistent with impaired  relaxation. Indeterminate filling pressures The E/e' is 8-15. No evidence of left ventricular regional wall motion abnormalities.  2. The right  ventricle has normal systolic function. The cavity was normal. There is no increase in right ventricular wall thickness.  3. The mitral valve is degenerative. Mild thickening of the mitral valve leaflet. There is mild mitral annular calcification present.  4. The tricuspid valve is grossly normal.  5. The aortic valve is  tricuspid. Mild calcification of the aortic valve. Aortic valve regurgitation is trivial by color flow Doppler.   PHYSICAL EXAM Pleasant mildly obese elderly lady not in distress. . Afebrile. Head is nontraumatic. Neck is supple without bruit.    Cardiac exam no murmur or gallop. Lungs are clear to auscultation. Distal pulses are well felt. Neurological Exam ;  Awake  Alert oriented x 3. Normal speech and language.eye movements full without nystagmus.fundi were not visualized. Vision acuity and fields appear normal. Hearing is normal. Palatal movements are normal. Face symmetric. Tongue midline. Normal strength, tone, reflexes and coordination.diminished fine finger movements on the right. Orbits left over right upper extremity. Mild weakness of right grip and intrinsic hand muscles. Normal sensation. Gait deferred.  ASSESSMENT/PLAN Ms. Jasmine Dawson is a 73 y.o. female with history of CKD stage III, HTN, HLD, Pulm HTN, SVT presenting with R arm and facial weakness.   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 Check TCD bubble and LE doppler. If neg, place ILR.   CT head mildly chronic WMD. No acute stroke.  CTA head & neck no LVO. Mod to severe L M1/M2 stenosis. ICA atherosclerosis up to 50%. Mild SVD. Neck patent  MRI  Small L FP/MCA infarct. Mod to severe SVD.  2D Echo EF 60-65%. No source of embolus   TCD bubble pending   LE dopplers pending   LDL 161  HgbA1c 5.9  Lovenox 40  mg sq daily for VTE prophylaxis  No antithrombotic prior to admission, now on aspirin 81 mg daily and clopidogrel 75 mg daily following load. Continue DAPT x 3 weeks then PLAVIX alone (as she has GERD on aspirin)  Therapy recommendations:  HH PT and OT  Disposition:  pending   Hypertension  Stable . Permissive hypertension (OK if < 220/120) but gradually normalize in 5-7 days . 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 Agree with note above. She presented with transient expressive aphasia followed by right hand weakness likely from cardiac embolic left MCA branch infarct. 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 Check lower extremity venous Doppler for DVT and transcranial Doppler bubble study for PFO. Continue ongoing stroke workup and aggressive risk factor modification. Greater than 50% time during this 35 minute visit was spent on counseling and coordination of care for embolic stroke and discussion about evaluation and treatment plan Delia Heady, MD Medical Director [facility] Pager: [phone] 07/29/2018 5:24 PM   To contact Stroke Continuity provider, please refer to  [email or web address]. After hours, contact General Neurology

## 2018-07-29 NOTE — Progress Notes (Signed)
Pt went off floor to MRI and back to unit at this time

## 2018-07-29 NOTE — Discharge Summary (Signed)
Name: Jasmine Dawson MRN: [id number] DOB: November 30, 1945 73 y.o. PCP: Jasmine Able, MD  Date of Admission: 07/28/2018  3:31 PM Date of Discharge:  07/31/2018 Attending Physician: Jasmine Catalina, MD  Discharge Diagnosis: 1.  Cryptogenic stroke 2.  Hyperlipidemia 3.  Hypertension 4.  History of supraventricular tachycardia 5.  Inherited thrombophilia 6.  CKD stage III  Discharge Medications: Allergies as of 07/31/2018      Reactions   Eggs Or Egg-derived Products Anaphylaxis, Swelling   Orange Fruit [citrus] Anaphylaxis, Swelling   Prednisone Anaphylaxis, Hives, Itching   Sulfa Antibiotics Anaphylaxis, Hives, Itching   Tomato Anaphylaxis, Swelling   Influenza Vaccines Other (See Comments)   unknown   Aspartame And Phenylalanine Palpitations   Caffeine Palpitations   Shellfish Allergy Itching, Nausea And Vomiting, Rash      Medication List    TAKE these medications   aspirin 81 MG EC tablet Take 1 tablet (81 mg total) by mouth daily for 20 days.   atenolol 50 MG tablet Commonly known as:  TENORMIN Take 0.5 tablets (25 mg total) by mouth daily. What changed:  how much to take   cloNIDine 0.1 MG tablet Commonly known as:  CATAPRES Take 1 tablet (0.1 mg total) by mouth daily.   clopidogrel 75 MG tablet Commonly known as:  PLAVIX Take 1 tablet (75 mg total) by mouth daily.   famotidine 20 MG tablet Commonly known as:  PEPCID Take 1 tablet (20 mg total) by mouth 2 (two) times daily.   rosuvastatin 40 MG tablet Commonly known as:  CRESTOR Take 1 tablet (40 mg total) by mouth daily at 6 PM.            Durable Medical Equipment  (From admission, onward)         Start     Ordered   07/29/18 1306  For home use only DME 3 n 1  Once     07/29/18 1305         Advised to continue taking all home medications with the adjustment of only taking Atenolol 25mg  (previously 50mg )  Current Outpatient Medications on File Prior to Encounter  Medication Sig Dispense  Refill  . atenolol (TENORMIN) 50 MG tablet Take 1 tablet (50 mg total) by mouth daily. 90 tablet 3  . cloNIDine (CATAPRES) 0.1 MG tablet Take 0.1 mg by mouth daily.    . clorazepate (TRANXENE) 7.5 MG tablet Take 7.5 mg by mouth 2 (two) times daily as needed for anxiety.    [name] COENZYME Q10 PO Take 300 mg by mouth daily.    . famotidine (PEPCID) 20 MG tablet Take 1 tablet (20 mg total) by mouth 2 (two) times daily. 30 tablet 0  . meclizine (ANTIVERT) 25 MG tablet Take 25 mg by mouth 3 (three) times daily as needed for dizziness.  1  . triamterene-hydrochlorothiazide (DYAZIDE) 37.5-25 MG capsule Take 1 capsule by mouth daily as needed (fluid).       Disposition and follow-up:   Ms.Jasmine Dawson was discharged from [facility] in [place] condition.  At the hospital follow up visit please address:  1.  Cryptogenic stroke: Please ensure compliance with aspirin 81 mg plus Plavix 75 mg for 3 weeks then Plavix alone indefinitely.       Hyperlipidemia: Please ensure compliance with Crestor 40 mg daily       History of supraventricular tachycardia: Initially bradycardic on admission with HR range 40s-50s.  Heart rate at discharge stable at  70s.  Atenolol resumed at half dose 25 mg  2.  Labs / imaging needed at time of follow-up: BMP  3.  Pending labs/ test needing follow-up: None  Follow-up Appointments: Follow-up Information    [facility] Follow up.   Specialty:  Home Health Services Why:  They will contact you for the first appointment Contact information: [address] [place] [address] [phone]        [facility] Follow up in 4 week(s).   Specialty:  Neurology Why:  stroke clinic. office will call with appt date and time.  Contact information: [address] [place] [address] [phone]       [facility] Follow up.   Specialty:  Cardiology Why:   08/09/2018 @ 9:30AM, wound check visit, please see discharge intructions Contact information: [address] [place] [address] [phone]          Hospital Course by problem list: 1.  Cryptogenic stroke: Ms. Jasmine Dawson is a very pleasant 73 year old woman with hypertension, CKD stage III, paroxysmal supraventricular tachycardia and inherited thrombophilia who initially presented to [facility] on July 28, 2018 with a 3-day history of right upper extremity weakness, decreased grip strength and numbness.  MRI brain revealed left frontoparietal/MCA infarct which was thought to be embolic however secondary to an unknown source.  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  She subsequently had an implantable loop recorder successfully placed with no complications.  2D echocardiogram revealed an ejection fraction of 60 to 65% with no source of embolus.  She was started on aspirin and Plavix to continue dual antiplatelet therapy for 3 weeks then Plavix alone.  She was successfully discharged with home health physical therapy and Occupational Therapy.  2.  Hyperlipidemia: Ms. Jasmine Dawson was noted to have an LDL level of 161 and calculated ASCVD of 23%.  She was started on Crestor milligrams daily.  3.  Hypertension: Her home antihypertensives prior to admission included atenolol 50 mg daily, clonidine 0.1 mg daily, triamterene-hydrochlorothiazide 37.5-25 mg daily as needed for fluid retention.  Given her presentation of stroke, we allowed for permissive hypertension and resumed clonidine 0.1 mg daily.  4.  History of supraventricular tachycardia: Ms. Jasmine Dawson has a known history of supraventricular tachycardia and was previously on metoprolol 50 mg however she was unable to  tolerate medication and decision was made to transition to atenolol 50 mg daily by her cardiologist.  During this admission, she did not have any episodes of found tachycardia.  Initially she was bradycardic with range of 40s-50s so her atenolol was held.  Telemetry monitor did not reveal any arrhythmia with exception of PVCs.  On the day of discharge, she was resumed on atenolol 25 mg daily and asked to follow-up with cardiology and PCP (Dr. Pecola Leisure).   5.  Inherited thrombophilia: She was recently diagnosed with inherited thrombophilia when she followed up with hematology oncology.  She was found to have a heterogeneous treat for factor V Leyden and prothrombin gene.  She has not had any reports of DVT or pulmonary embolus and is thus not on anticoagulation.  Though it was recommended by her oncologist to aggressively start VTE prophylaxis when she becomes immobile.  She was managed with prophylaxis subcutaneous Lovenox.  6.  CKD stage III: This was stable during this admission  Discharge Vitals:   BP (!) 162/80 (BP Location: Right Arm)   Pulse 64   Temp 97.6 F (36.4 C) (Oral)   Resp 17   Ht  (1.626 m)   Wt 84.2 kg   SpO2 93%   BMI 31.86 kg/m   Pertinent Labs, Studies, and Procedures:  MRI IMPRESSION: 1. Acute small LEFT frontoparietal/MCA territory nonhemorrhagic infarcts. 2. Moderate to severe chronic small vessel ischemic changes.  CT angiogram IMPRESSION: 1. Negative CTA for large vessel occlusion. 2. Multifocal moderate to severe distal left M1/proximal M2 stenoses. 3. 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 4. Distal small vessel atheromatous irregularity throughout the intracranial arterial circulation. 5. Wide patency of the major arterial vasculature of the neck.  Lower extremity Doppler Summary: Right: There is no evidence of deep vein thrombosis in the lower extremity. No cystic  structure found in the popliteal fossa. Left: There is no evidence of deep vein thrombosis in the lower extremity. No cystic structure found in the popliteal fossa.  Transcranial Doppler with bubble study Summary: -A vascular evaluation was performed. The right middle cerebral artery was studied. An IV was inserted into the patient's left forearm. Verbal informed consent was obtained. -No HITS at rest or during Valsalva. -No apparent PFO.  Implantable loop recorder -Placed on August 26, 2018  Discharge Instructions: Discharge Instructions    Ambulatory referral to Neurology   Complete by:  As directed    Follow up with stroke clinic NP (Jessica Vanschaick or Darrol Angel, if both not available, consider Dr. Delia Heady, Dr. Jamelle Rushing, or Dr. Naomie Dean) at [facility] in about 4 weeks.   Call MD for:  difficulty breathing, headache or visual disturbances   Complete by:  As directed    Call MD for:  persistant dizziness or light-headedness   Complete by:  As directed    Diet - low sodium heart healthy   Complete by:  As directed    Discharge instructions   Complete by:  As directed    Ms. Bowell,   It was a pleasure taking care of you here at the hospital. You were admitted because of small strokes which was causing you to have the right side weakness and numbness. This was confirmed by an MRI of your brain.   Because we do not know the source of the stroke, we had to place a loop recorder (Which [name] did today) to monitor if you have any abnormal heart rhythm.  The neurologist will like for you to take Aspirin 81 mg + Plavix 75 mg for 3 weeks THEN take only PLAVIX afterwards. They have scheduled a follow up visit for you.  For your heart monitor, you have a follow up appointment with [name] for wound check which has also been scheduled.  I would also like for you to make a close appointment with your primary care doctor after discharge.   I will like for you to decrease the dose of atenolol to  and have CLOSE follow up with your primary care doctor.   Pease continue taking all your other medications as prescribed by your doctors.   Sincerely, Pierra Skora   Face-to-face encounter (required for Medicare/Medicaid patients)   Complete by:  As directed    I Fleur Audino K Irisha Grandmaison certify that this patient is under my care and that I, or a nurse  practitioner or physician's assistant working with me, had a face-to-face encounter that meets the physician face-to-face encounter requirements with this patient on 07/31/2018. The encounter with the patient was in whole, or in part for the following medical condition(s) which is the primary reason for home health care (List medical condition): Recent stroke   The encounter with the patient was in whole, or in part, for the following medical condition, which is the primary reason for home health care:  Recent stroke   I certify that, based on my findings, the following services are medically necessary home health services:  Physical therapy   Reason for Medically Necessary Home Health Services:  Therapy- Investment banker, operationalGait Training, Patent examinerTransfer Training and Stair Training   My clinical findings support the need for the above services:  Unsafe ambulation due to balance issues   Further, I certify that my clinical findings support that this patient is homebound due to:  Unsafe ambulation due to balance issues   Home Health   Complete by:  As directed    To provide the following care/treatments:   PT OT     Increase activity slowly   Complete by:  As directed       Signed: Yvette RackAgyei, Daeshawn Redmann K, MD 07/31/2018, 11:13 AM   Pager: [phone] IMTS PGY-1

## 2018-07-29 NOTE — Progress Notes (Addendum)
Subjective: HD#1   Overnight: Admitted   Today she is feeling almost back to her normal self. Denies tingling. Endorses slight lightheadedness initially with ambulation but resolved after a few minutes. Appetite is poor.   Objective:  Vital signs in last 24 hours: Vitals:   07/28/18 2330 07/29/18 0130 07/29/18 0215 07/29/18 0400  BP: (!) 183/72 (!) 160/81 (!) 157/68 (!) 173/63  Pulse: (!) 52 (!) 52 (!) 49 (!) 54  Resp: 16 18 16    Temp: 97.7 F (36.5 C) 97.8 F (36.6 C) 97.8 F (36.6 C) 97.7 F (36.5 C)  TempSrc: Oral Oral Oral Axillary  SpO2: 99% (!) 87% 97% 96%  Weight:      Height:       General: In no apparent distress, comfortably lying in bed, speaks in full sentences HEENT: Atraumatic, normocephalic Respiratory: CTA BL, no wheezes, crackles, rhonchi Cardiovascular: RRR, no murmurs, gallops, rubs Abdomen: Bowel sounds present, nontender to palpation Neuro: -Alert and oriented, speaks in full sentences -Cranial nerves II to XII intact with no focal neurological deficits -No dysdiadochokinesia -Upper and lower extremity motor strength intact -No sensory deficits on light touch at the upper and lower extremities   Assessment/Plan:  Active Problems:   Weakness  Jasmine Dawson is a pleasant 73 year old woman with hypertension, CKD stage III, paroxysmal supraventricular tachycardia and inherited thrombophilia who presented to [facility] ED on 07/28/2018 with 3-day history of right upper extremity weakness and decreased grip strength.  She was noted to have an acute small left frontoparietal/MCA territory nonhemorrhagic infarct on MRI.  Cerebrovascular accident: Presented with a 3-day history of right upper extremity weakness and decreased grip strength.  An MRI revealed acute small left frontoparietal/MCA territory nonhemorrhagic infarcts as well as moderate to severe chronic small vessel ischemic changes.  This morning on examination, she denied any tingling, numbness,  weakness.  She did not have a fascia or dysarthria.  Her neurological exam was completely unremarkable with no focal neurological weakness or sensory deficits.  Echocardiogram reveals left ventricular ejection fraction of 60 to 65%, grade 1 DD and mild aortic valve calcification with trivial aortic insufficiency.  No evidence of atrial appendage thrombus. - Continue aspirin 81 mg daily plus Plavix 75 mg daily - Allow for permissive hypertension (180-220/105-120) for another 24 hours (1300 on 07/30/2018) - Appreciate neurology recommendation - Frequent neurochecks - Requires home health PT  Hyperlipidemia: Lipid panel reveals LDL of 160 and cholesterol of 240.  Her ASCVD score is 23.4% and she requires high intensity statin.  She has documented anaphylaxis reaction to medications containing eggs and I wonder if she would be a candidate for PCSK 9 inhibitors.  Hypertension: BP this morning at range 170s-180s/60s-70s.  Her home antihypertensives are atenolol 50 mg daily clonidine 0.1 mg daily. -Allow for permissive hypertension (180-220/100-120) for another 24 hours (1300 on 07/30/2018)  History of paroxysmal supraventricular tachycardia: This has been well controlled on atenolol 50 mg daily.  During her hospitalization here, she has been bradycardic with heart rate range 30- 60s.  She is however been asymptomatic.  On review of her cardiology visits, she was previously on metoprolol 50 mg twice daily however was unable to tolerate medication due to dizziness.  This was then transitioned to atenolol 50 mg daily.  She was advised that she could split medication into 2 doses or reduced to 25 mg daily if she has the same symptomatic effect from her SVT. -Continue to hold atenolol -If becomes tachycardic, can resume atenolol 50  mg daily.  Inherited thrombophilia: She was noted to test positive for factor V and prothrombin gene however was advised that anticoagulation was not necessary unless she developed blood  clot.  However, hematologist recommended aggressive DVT prophylaxis with immobilization or surgical procedure. -Continue subcutaneous Lovenox  CKD stage III: Labile, continue to monitor.  Serum creatinine of 1.3 today.   FEN: No IV fluids, regular diet, replace electrolytes as needed(Hypokalemia) VTE ppx: Subcutaneous Lovenox CODE STATUS: Full code  Dispo: Anticipated discharge in approximately 1 day.   Yvette Rack, MD 07/29/2018, 6:23 AM Pager: [phone] IMTS PGY-1

## 2018-07-29 NOTE — Progress Notes (Signed)
  Echocardiogram 2D Echocardiogram has been performed.  Celene Skeen 07/29/2018, 9:13 AM

## 2018-07-29 NOTE — Progress Notes (Signed)
OT Evaluation   Pt lives with her husband and was independent with ADL and mobility.Pt currently demonstrates apparent sensory-motor deficits, primarily with R hand. Will further assess executive level skills and  her ability to complete her medication management with the Pill Box test. Will establish HEP. Recommend HHOT for follow up. Will follow acutely.     07/29/18 1400  OT Visit Information  Last OT Received On 07/29/18  Assistance Needed +1  History of Present Illness Pt is a 73 y/o female admitted secondary to R UE weakness. MRI of the brain revealed small acute L frontoparietal/MCA territory infarcts. PMH including but not limited to CKD and HTN.  Precautions  Precautions None  Restrictions  Weight Bearing Restrictions No  Home Living  Family/patient expects to be discharged to: Private residence  Living Arrangements Spouse/significant other;Children  Available Help at Discharge Family  Type of Home House  Home Access Stairs to enter  Entrance Stairs-Number of Steps 4  Entrance Stairs-Rails None  Home Layout Laundry or work area in basement  [employer] Walk-in Pension scheme manager Yes  How Accessible Accessible via walker  Home Equipment None  Additional Comments temporarily living at the [place] (on the ground floor) due to home damaged by trees  Prior Function  Level of Independence Independent  Comments does not drive; manages own [employer]  Communication No difficulties  Pain Assessment  Pain Assessment No/denies pain  Cognition  Arousal/Alertness Awake/alert  Behavior During Therapy WFL for tasks assessed/performed  Overall Cognitive Status No family/caregiver present to determine baseline cognitive functioning  Area of Impairment Safety/judgement;Problem solving  Safety/Judgement Decreased awareness of deficits  General Comments will further assess  Upper Extremity Assessment  Upper Extremity  Assessment RUE deficits/detail  RUE Deficits / Details gross grasp/release; ableto oppose thumb to index and middle fingers only; absent in-hand manipulation skills; apparent sensory motor deficits and decreased functional use of R hand  RUE Sensation decreased proprioception  RUE Coordination decreased fine motor  Lower Extremity Assessment  Lower Extremity Assessment Defer to PT evaluation  Cervical / Trunk Assessment  Cervical / Trunk Assessment Normal  ADL  Overall ADL's  Needs assistance/impaired  Eating/Feeding Set up  Eating/Feeding Details (indicate cue type and reason) difficulty cutting food; difficulty maintaining grasp on utensil  Grooming Set up  Upper Body Bathing Set up;Sitting  Lower Body Bathing Min guard;Sit to/from stand  Upper Body Dressing  Sitting;Minimal assistance  Upper Body Dressing Details (indicate cue type and reason) difficulty with fasetners  Lower Body Dressing Min guard  Toilet Transfer Supervision/safety  Functional mobility during ADLs Supervision/safety  Vision- History  Baseline Vision/History No visual deficits  Vision- Assessment  Additional Comments appears intact; will further asess  Perception  Perception Tested? Yes  Comments appears intact  Praxis  Praxis tested?  (will further assess; intact for funcitonal ADL tasks; ? apra)  Bed Mobility  Overal bed mobility Modified Independent  Transfers  Overall transfer level Needs assistance  Equipment used None  Transfers Sit to/from Stand  Sit to Stand Supervision  General transfer comment for safety  Balance  Overall balance assessment Needs assistance  Sitting-balance support Feet supported  Sitting balance-Leahy Scale Good  Standing balance support No upper extremity supported  Standing balance-Leahy Scale Fair  OT - End of Session  Activity Tolerance Patient tolerated treatment well  Patient left in bed;with call bell/phone within reach;with bed alarm set  Nurse Communication  Mobility status  OT Assessment  OT Recommendation/Assessment Patient needs continued OT Services  OT Visit Diagnosis Muscle weakness (generalized) (M62.81)  OT Problem List Decreased strength;Decreased range of motion;Decreased coordination;Decreased knowledge of use of DME or AE;Impaired sensation;Impaired UE functional use  OT Plan  OT Frequency (ACUTE ONLY) Min 3X/week  OT Treatment/Interventions (ACUTE ONLY) Self-care/ADL training;Therapeutic exercise;DME and/or AE instruction;Therapeutic activities;Patient/family education  AM-PAC OT "6 Clicks" Daily Activity Outcome Measure (Version 2)  Help from another person eating meals? 3  Help from another person taking care of personal grooming? 3  Help from another person toileting, which includes using toliet, bedpan, or urinal? 4  Help from another person bathing (including washing, rinsing, drying)? 3  Help from another person to put on and taking off regular upper body clothing? 3  Help from another person to put on and taking off regular lower body clothing? 3  6 Click Score 19  OT Recommendation  Follow Up Recommendations Home health OT;Supervision - Intermittent  OT Equipment 3 in 1 bedside commode  Individuals Consulted  Consulted and Agree with Results and Recommendations Patient  Acute Rehab OT Goals  Patient Stated Goal to get stronger  OT Goal Formulation With patient  Time For Goal Achievement 08/12/18  Potential to Achieve Goals Good  OT Time Calculation  OT Start Time (ACUTE ONLY) 1240  OT Stop Time (ACUTE ONLY) 1255  OT Time Calculation (min) 15 min  OT General Charges  $OT Visit 1 Visit  OT Evaluation  $OT Eval Low Complexity 1 Low  Written Expression  Dominant Hand Right   Luisa Dago, OT/L   Acute OT Clinical Specialist Acute Rehabilitation Services Pager [phone] Office [phone]
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
## 2018-07-29 NOTE — Plan of Care (Signed)
  Problem: Education: Goal: Knowledge of patient specific risk factors addressed and post discharge goals established will improve Outcome: Progressing   Problem: Education: Goal: Knowledge of secondary prevention will improve Outcome: Progressing   Problem: Education: Goal: Knowledge of secondary prevention will improve Outcome: Progressing Goal: Knowledge of patient specific risk factors addressed and post discharge goals established will improve Outcome: Progressing

## 2018-07-29 NOTE — Evaluation (Signed)
Speech Language Pathology Evaluation Patient Details Name: Jasmine Dawson MRN: [id number] DOB: Mar 07, 1946 Today's Date: 07/29/2018 Time: 4098-1191 SLP Time Calculation (min) (ACUTE ONLY): 17 min  Problem List:  Patient Active Problem List   Diagnosis Date Noted  . Weakness 07/28/2018  . Chest pain 10/31/2017  . Palpitations 10/31/2017  . Right leg pain 10/31/2017  . Elevated troponin 10/31/2017  . SVT (supraventricular tachycardia) (HCC) 10/03/2016  . CRI (chronic renal insufficiency), stage 3 (moderate) (HCC) 10/03/2016  . Endometrial polyp 11/07/2011  . Dizzy spells 09/11/2011  . HTN (hypertension) 09/11/2011  . Abdominal pain 09/11/2011  . Cholelithiasis 09/11/2011   Past Medical History:  Past Medical History:  Diagnosis Date  . CKD (chronic kidney disease), stage III (HCC)   . Dysrhythmia    occasional PVCs  . Heart palpitations   . Hyperlipidemia   . Hypertension   . Motion sickness   . Pulmonary hypertension (HCC)    a. 2D echo 11/01/17 showed EF 65-70%, grade 1 DD, mod-severe TR, moderately increased PASP .  [name] SVT (supraventricular tachycardia) (HCC)   . Tricuspid regurgitation    Past Surgical History:  Past Surgical History:  Procedure Laterality Date  . HYSTEROSCOPY W/D&C  11/07/2011   Procedure: DILATATION AND CURETTAGE /HYSTEROSCOPY;  Surgeon: Antionette Char, MD;  Location: [facility];  Service: Gynecology;  Laterality: N/A;  endometrial polypectomy  . TUBAL LIGATION  1977   HPI:  Pt  is a 73 yo female with a medical history of HTN, CKDIII, paroxysmal SVT, and inherited thrombophilia who presented for right hand weakness and reported confusion.  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   Assessment / Plan / Recommendation Clinical Impression  Pt participated in speech/language evaluation and was cooperative throughout the session. The pt stated that she does recall her speech "not making sense"  immediately prior to admission but stated that this difficulty has resolved. Her speech and language skills are currently within normal limits and no overt cognitive deficits were noted. Further skilled SLP services are not clinically indicated at time. Pt and nursing were educated regarding this and both parties verbalized understanding as well as agreement with plan of care.    SLP Assessment  SLP Recommendation/Assessment: Patient does not need any further Speech Lanaguage Pathology Services SLP Visit Diagnosis: Aphasia (R47.01)    Follow Up Recommendations  None    Frequency and Duration           SLP Evaluation Cognition  Overall Cognitive Status: Within Functional Limits for tasks assessed Arousal/Alertness: Awake/alert Orientation Level: Oriented X4 Attention: Focused;Sustained Focused Attention: Appears intact Sustained Attention: Appears intact Memory: Appears intact(Immediate: 3/3; Delayed: 2/3; 1/1 with cues) Awareness: Appears intact Problem Solving: Appears intact(5/5)       Comprehension  Auditory Comprehension Overall Auditory Comprehension: Appears within functional limits for tasks assessed Yes/No Questions: Within Functional Limits(Simple: 5/5; Complex: 4/4; Paragraph: 2/4) Commands: Within Functional Limits(2-step: 4/4; 3-step: 4/4) Conversation: Complex Visual Recognition/Discrimination Discrimination: Within Function Limits Reading Comprehension Reading Status: Within funtional limits    Expression Expression Primary Mode of Expression: Verbal Verbal Expression Overall Verbal Expression: Appears within functional limits for tasks assessed Initiation: No impairment Automatic Speech: Counting;Day of week;Month of year(WNL) Level of Generative/Spontaneous Verbalization: Conversation Repetition: No impairment(5/5) Naming: No impairment(Confrontational: 10/10; Responsive: 5/5; Setence completion:) Pragmatics: No impairment   Oral / Motor  Oral  Motor/Sensory Function Overall Oral Motor/Sensory Function: Within functional limits Motor Speech Overall Motor Speech: Appears within functional limits for tasks assessed Respiration:  Within functional limits Phonation: Normal Resonance: Within functional limits Articulation: Within functional limitis Intelligibility: Intelligible Motor Planning: Witnin functional limits   Jasmine Dawson I. Vear Clock, MS, CCC-SLP Acute Rehabilitation Services Office number [phone] Pager [phone]                     Jasmine Dawson 07/29/2018, 12:05 PM
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
## 2018-07-30 ENCOUNTER — Inpatient Hospital Stay (HOSPITAL_COMMUNITY): Payer: Medicare Other

## 2018-07-30 ENCOUNTER — Encounter (HOSPITAL_COMMUNITY)
Admission: EM | Disposition: A | Discharge status: Home / Self Care | Payer: Self-pay | Source: Home / Self Care | Attending: Internal Medicine

## 2018-07-30 ENCOUNTER — Telehealth: Payer: Self-pay | Admitting: Interventional Cardiology

## 2018-07-30 ENCOUNTER — Encounter (HOSPITAL_COMMUNITY): Payer: Self-pay | Admitting: *Deleted

## 2018-07-30 DIAGNOSIS — I6389 Other cerebral infarction: Secondary | ICD-10-CM

## 2018-07-30 DIAGNOSIS — R29898 Other symptoms and signs involving the musculoskeletal system: Secondary | ICD-10-CM

## 2018-07-30 DIAGNOSIS — I639 Cerebral infarction, unspecified: Secondary | ICD-10-CM

## 2018-07-30 DIAGNOSIS — I160 Hypertensive urgency: Secondary | ICD-10-CM

## 2018-07-30 DIAGNOSIS — Z8679 Personal history of other diseases of the circulatory system: Secondary | ICD-10-CM

## 2018-07-30 HISTORY — PX: LOOP RECORDER INSERTION: EP1214

## 2018-07-30 LAB — BASIC METABOLIC PANEL
Anion gap: 11 (ref 5–15)
BUN: 19 mg/dL (ref 8–23)
CO2: 21 mmol/L — ABNORMAL LOW (ref 22–32)
Calcium: 9.3 mg/dL (ref 8.9–10.3)
Chloride: 106 mmol/L (ref 98–111)
Creatinine, Ser: 1.45 mg/dL — ABNORMAL HIGH (ref 0.44–1.00)
GFR calc Af Amer: 41 mL/min — ABNORMAL LOW (ref >60)
GFR calc non Af Amer: 35 mL/min — ABNORMAL LOW (ref >60)
Glucose, Bld: 89 mg/dL (ref 70–99)
Potassium: 4.1 mmol/L (ref 3.5–5.1)
Sodium: 138 mmol/L (ref 135–145)

## 2018-07-30 SURGERY — LOOP RECORDER INSERTION

## 2018-07-30 MED ORDER — CLONIDINE HCL 0.1 MG PO TABS
0.1000 mg | ORAL_TABLET | Freq: Every day | ORAL | Status: DC
  Administered 2018-07-30 – 2018-07-31 (×2): 0.1 mg via ORAL
  Filled 2018-07-30 (×2): qty 1

## 2018-07-30 MED ORDER — HYDRALAZINE HCL 10 MG PO TABS: 10.0000 mg | ORAL_TABLET | Freq: Three times a day (TID) | ORAL | Status: DC | PRN

## 2018-07-30 MED ORDER — LIDOCAINE-EPINEPHRINE 1 %-1:100000 IJ SOLN
INTRAMUSCULAR | Status: DC | PRN
  Administered 2018-07-30: 30 mL

## 2018-07-30 MED ORDER — LIDOCAINE-EPINEPHRINE 1 %-1:100000 IJ SOLN
INTRAMUSCULAR | Status: AC
  Filled 2018-07-30: qty 1

## 2018-07-30 MED ORDER — HYDRALAZINE HCL 25 MG PO TABS: 25.0000 mg | ORAL_TABLET | Freq: Three times a day (TID) | ORAL | Status: DC | PRN

## 2018-07-30 MED ORDER — TRIAMTERENE-HCTZ 37.5-25 MG PO TABS: 1.0000 | ORAL_TABLET | Freq: Every day | ORAL | Status: DC

## 2018-07-30 MED ORDER — ROSUVASTATIN CALCIUM 20 MG PO TABS
40.0000 mg | ORAL_TABLET | Freq: Every day | ORAL | Status: DC
  Administered 2018-07-30: 19:00:00 40 mg via ORAL
  Filled 2018-07-30: qty 2

## 2018-07-30 SURGICAL SUPPLY — 2 items
LOOP REVEAL LINQSYS (Prosthesis & Implant Heart) ×2 IMPLANT
PACK LOOP INSERTION (CUSTOM PROCEDURE TRAY) ×2 IMPLANT

## 2018-07-30 NOTE — Telephone Encounter (Signed)
Spoke to Dr. Dortha Schwalbe and made him aware that Dr. Eldridge Dace stated that if the patient is stable for discharge could do virtual but if the patient is not doing well then may need in office. Made him aware that he advised for him to reach out to [name] since he physically saw the patient today and see what his recommendation is.

## 2018-07-30 NOTE — Progress Notes (Signed)
Occupational Therapy Treatment Patient Details Name: Jasmine Dawson MRN: [id number] DOB: 1945/06/20 Today's Date: 07/30/2018    History of present illness Pt is a 73 y/o female admitted secondary to R UE weakness. MRI of the brain revealed small acute L frontoparietal/MCA territory infarcts. PMH including but not limited to CKD and HTN.   OT comments  Pt educated on HEP for RUE. Given theraputty and written HEP. Pt demonstrated understanding. Pt assessed with the Pill Box Test: An Ecological Measure of Executive Level Skills and Medication Management. Pt did not pass due to having 7 medication errors (greater than 3 errors is considered a FAIL). See below. Recommend pt have direct S with all medication management. Pt verbalized understanding. Attempted to contact to discuss with husband.  Continue to recommend DC home with HHOT.   Follow Up Recommendations  Home health OT;Supervision - Intermittent    Equipment Recommendations  3 in 1 bedside commode    Recommendations for Other Services      Precautions / Restrictions Precautions Precautions: None       Mobility Bed Mobility Overal bed mobility: Modified Independent                Transfers Overall transfer level: Needs assistance   Transfers: Sit to/from Stand Sit to Stand: Supervision              Balance                                           ADL either performed or assessed with clinical judgement   ADL Overall ADL's : Needs assistance/impaired                                     Functional mobility during ADLs: Supervision/safety       Vision       Perception     Praxis      Cognition Arousal/Alertness: Awake/alert Behavior During Therapy: WFL for tasks assessed/performed Overall Cognitive Status: Impaired/Different from baseline Area of Impairment: Attention;Safety/judgement;Awareness                   Current Attention Level: Selective      Safety/Judgement: Decreased awareness of deficits Awareness: Emergent Problem Solving: Difficulty sequencing General Comments: Cardiologist asked why she was in the hospital and pt resonded "they said I had a stroke". when asked about what symptoms she had she responded " her speach and stuff". Pt required cues to note that she has great difficulty using her dominant R hand. Assessed with the Pill Box Test.   General Comments: Assessed using the Pill Box Test. Pt failed the assessment, demonstrating poor planning, mental flexibility, suboptimal search strategies, concrete thinking and the inability to multitask. Pt had a total of _7_errors, where more than 3 errors is considered a fail.  Errors: One tablet 3x/day (yellow) - __2errors (omission/misplacement)  - doubled medication in evening One tablet 2x/day with breakfast and dinner (green) - _0_ errors (omission/misplacement)  - initially placing 2 tablets at breakfast; therapist corrected; likely to be more errors One tablet in the morning (Blue)- 0__ errors (omission/misplacement) One tablet daily at bedtime (orange) - _2_ errors(omission/misplacement) - double medicaiton One tablet every other day (red) - _3_ errors (omission/misplacement) only placed in 1 day   Number of misplaced movement  errors (pills placed in incorrect compartment)- _2_ Number of total errors (sum of omissions; misplacements) - 7__ Total time to complete task (allowed 5 min) - _30_ min      Exercises Exercises: Other exercises Other Exercises Other Exercises: theraputty/red Other Exercises: grip/pinch strenthening with theraputty Other Exercises: fine motor control/coordination Other Exercises: in-hand manipulation tasks   Shoulder Instructions       General Comments      Pertinent Vitals/ Pain          Home Living                                          Prior Functioning/Environment              Frequency  Min 3X/week         Progress Toward Goals  OT Goals(current goals can now be found in the care plan section)  Progress towards OT goals: Progressing toward goals  Acute Rehab OT Goals Patient Stated Goal: to get stronger OT Goal Formulation: With patient Time For Goal Achievement: 08/12/18 Potential to Achieve Goals: Good ADL Goals Pt Will Transfer to Toilet: with modified independence;ambulating Pt/caregiver will Perform Home Exercise Program: Increased strength;Right Upper extremity;With written HEP provided Additional ADL Goal #1: Pt will independently verbalize 3 strategies to reduce risk of falls  Plan Discharge plan remains appropriate    Co-evaluation                 AM-PAC OT "6 Clicks" Daily Activity     Outcome Measure   Help from another person eating meals?: None Help from another person taking care of personal grooming?: None Help from another person toileting, which includes using toliet, bedpan, or urinal?: None Help from another person bathing (including washing, rinsing, drying)?: A Little Help from another person to put on and taking off regular upper body clothing?: A Little Help from another person to put on and taking off regular lower body clothing?: A Little 6 Click Score: 21    End of Session    OT Visit Diagnosis: Muscle weakness (generalized) (M62.81)   Activity Tolerance Patient tolerated treatment well   Patient Left in chair;with call bell/phone within reach;with chair alarm set   Nurse Communication Mobility status;Other (comment)(need to contact cardiologist regarding loop recorder per pt)        Time: 1050-1140 OT Time Calculation (min): 50 min  Charges: OT General Charges $OT Visit: 1 Visit OT Treatments $Therapeutic Activity: 23-37 mins $Neuromuscular Re-education: 8-22 mins  Luisa Dago, OT/L   Acute OT Clinical Specialist Acute Rehabilitation Services Pager [phone] Office [phone]    [facility] 07/30/2018,  11:56 AM

## 2018-07-30 NOTE — Consult Note (Signed)
ELECTROPHYSIOLOGY CONSULT NOTE  Patient ID: Jasmine Dawson MRN: [id number], DOB/AGE: [birth year]   Admit date: 07/28/2018 Date of Consult: 07/30/2018  Primary Physician: Leilani Able, MD Primary Cardiologist: Dr. Eldridge Dace Reason for Consultation: Cryptogenic stroke ; recommendations regarding Implantable Loop Recorder, requested by Dr. Pearlean Brownie  History of Present Illness Jasmine Dawson was admitted on 07/28/2018 with stroke.    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  Hematology did not recommend any anticoagulation unless she develops blood clots in the future (visit 05/14/2018).  They came with waxing/waning left face tingling and right hand weakness.   Neurology notes: . 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  The patient has been monitored on telemetry which has demonstrated sinus rhythm with no arrhythmias.  Inpatient stroke work-up [facility] not include TEE given COVID19 restrictions.   Echocardiogram this admission demonstrated   IMPRESSIONS  1. The left ventricle has normal systolic function with an ejection fraction of 60-65%. The cavity size was normal. There is moderately increased left ventricular wall thickness. Left ventricular diastolic Doppler parameters are consistent with impaired  relaxation. Indeterminate filling pressures The E/e' is 8-15. No evidence of left ventricular regional wall motion abnormalities.  2. The right ventricle has normal systolic function. The cavity was normal. There is no increase in right  ventricular wall thickness.  3. The mitral valve is degenerative. Mild thickening of the mitral valve leaflet. There is mild mitral annular calcification present.  4. The tricuspid valve is grossly normal.  5. The aortic valve is tricuspid. Mild calcification of the aortic valve. Aortic valve regurgitation is trivial by color flow Doppler.  SUMMARY LVEF 60-65%, moderate LVH, normal wall motion, grade 1 DD, indeterminate LV filling pressure, mild aortic valve calcification with trivial AI, normal biatrial size, trivial MR, mild TR, RVSP 38 mmHg, normal IVC, negative for PFO by color doppler  FINDINGS  Left Ventricle: The left ventricle has normal systolic function, with an ejection fraction of 60-65%. The cavity size was normal. There is moderately increased left ventricular wall thickness. Left ventricular diastolic Doppler parameters are consistent  with impaired relaxation. Indeterminate filling pressures The E/e' is 8-15. No evidence of left ventricular regional wall motion abnormalities.. Right Ventricle: The right ventricle has normal systolic function. The cavity was normal. There is no increase in right ventricular wall thickness. Left Atrium: left atrial size was normal in size Left Atrial Appendage: Right Atrium: right atrial size was normal in size. Right atrial pressure is estimated at 3 mmHg. Interatrial Septum: No atrial level shunt detected by color flow Doppler. Pericardium: There is no evidence of pericardial effusion. Mitral Valve: The mitral valve is degenerative in appearance. Mild thickening of the mitral valve leaflet. There is mild mitral annular calcification present. Mitral valve regurgitation is trivial by color flow Doppler. Tricuspid Valve: The tricuspid valve is grossly normal. Tricuspid valve regurgitation is mild by color flow Doppler. Aortic Valve: The aortic valve is tricuspid Mild calcification of the aortic valve. Aortic valve regurgitation is trivial by color  flow Doppler. There is no evidence of aortic valve stenosis. Pulmonic Valve: The  pulmonic valve was grossly normal. Pulmonic valve regurgitation is not visualized by color flow Doppler. Venous: The inferior vena cava measures 1.02 cm, is normal in size with greater than 50% respiratory variability.    07/30/2018 LE venous doppler PRELIM result) Summary: Right: There is no evidence of deep vein thrombosis in the lower extremity. No cystic structure found in the popliteal fossa. Left: There is no evidence of deep vein thrombosis in the lower extremity. No cystic structure found in the popliteal fossa  Lab work is reviewed.   Prior to admission, the patient denies chest pain, shortness of breath, dizziness, or syncope.   she does have h/o palpitations with a known SVT She is recovering from their stroke with plans to home at discharge.   Past Medical History:  Diagnosis Date   CKD (chronic kidney disease), stage III (HCC)    Dysrhythmia    occasional PVCs   Heart palpitations    Hyperlipidemia    Hypertension    Motion sickness    Pulmonary hypertension (HCC)    a. 2D echo 11/01/17 showed EF 65-70%, grade 1 DD, mod-severe TR, moderately increased PASP .   SVT (supraventricular tachycardia) (HCC)    Tricuspid regurgitation      Surgical History:  Past Surgical History:  Procedure Laterality Date   HYSTEROSCOPY W/D&C  11/07/2011   Procedure: DILATATION AND CURETTAGE /HYSTEROSCOPY;  Surgeon: Antionette Char, MD;  Location: [facility];  Service: Gynecology;  Laterality: N/A;  endometrial polypectomy   TUBAL LIGATION  1977     Medications Prior to Admission  Medication Sig Dispense Refill Last Dose   atenolol (TENORMIN) 50 MG tablet Take 1 tablet (50 mg total) by mouth daily. 90 tablet 3 07/28/2018 at 0830   famotidine (PEPCID) 20 MG tablet Take 1 tablet (20 mg total) by mouth 2 (two) times daily. 30 tablet 0 07/28/2018 at Unknown time    Inpatient Medications:    aspirin EC  81 mg Oral Daily   clopidogrel  75 mg Oral Daily   enoxaparin (LOVENOX) injection  40 mg Subcutaneous Q24H   famotidine  20 mg Oral BID   potassium chloride  20 mEq Oral BID    Allergies:  Allergies  Allergen Reactions   Eggs Or Egg-Derived Products Anaphylaxis and Swelling   Orange Fruit [Citrus] Anaphylaxis and Swelling   Prednisone Anaphylaxis, Hives and Itching   Sulfa Antibiotics Anaphylaxis, Hives and Itching   Tomato Anaphylaxis and Swelling   Influenza Vaccines Other (See Comments)    unknown   Aspartame And Phenylalanine Palpitations   Caffeine Palpitations   Shellfish Allergy Itching, Nausea And Vomiting and Rash    Social History   Socioeconomic History   Marital status: Married    Spouse name: Not on file   Number of children: Not on file   Years of education: Not on file   Highest education level: Not on file  Occupational History   Not on file  Social Needs   Financial resource strain: Not on file   Food insecurity:    Worry: Not on file    Inability: Not on file   Transportation needs:    Medical: Not on file    Non-medical: Not on file  Tobacco Use   Smoking status: Never Smoker   Smokeless tobacco: Never Used  Substance and Sexual Activity   Alcohol use: No   Drug use: No   Sexual activity: Not Currently  Lifestyle   Physical activity:    Days per week:  Not on file    Minutes per session: Not on file   Stress: Not on file  Relationships   Social connections:    Talks on phone: Not on file    Gets together: Not on file    Attends religious service: Not on file    Active member of club or organization: Not on file    Attends meetings of clubs or organizations: Not on file    Relationship status: Not on file   Intimate partner violence:    Fear of current or ex partner: Not on file    Emotionally abused: Not on file    Physically abused: Not on file    Forced sexual activity: Not on file  Other  Topics Concern   Not on file  Social History Narrative   Did daycare as a younger lady.  Retired at [employer] [phone]               Family History  Problem Relation Age of Onset   Diabetes Mother        died at 20   Heart attack Mother    Heart attack Father        died at 19      Review of Systems All other systems reviewed and are otherwise negative except as noted above.  Physical Exam: Vitals:   07/29/18 1959 07/30/18 0001 07/30/18 0423 07/30/18 0841  BP: (!) 190/91 (!) 150/70 (!) 152/63 (!) 159/73  Pulse: (!) 55 (!) 57 71 (!) 56  Resp: Temp: 97.8 F (36.6 C) 97.7 F (36.5 C) 97.8 F (36.6 C) 98.1 F (36.7 C)  TempSrc: Oral Oral Oral Oral  SpO2: 100% 96% 93% 97%  Weight:      Height:         LIMITED/VISUAL EXAM GEN- The patient is well appearing, alert and oriented x 3 today.   Head- normocephalic, atraumatic Eyes-  Sclera clear, conjunctiva pink Ears- hearing intact Neck- supple Lungs-  not examined, she is breathing comfortably, speaks in full sentenses without difficulty, no audible wheezing Heart-   not examined, telemetry/echo are reviewed GI-  not examined Extremities-  no edema MS- no obvious deformities,  age appropriate atrophy Skin- not examined Psych-  euthymic mood, full affect   Labs:   Lab Results  Component Value Date   WBC 6.6 07/29/2018   HGB 12.9 07/29/2018   HCT 39.4 07/29/2018   MCV 87.6 07/29/2018   PLT 237 07/29/2018    Recent Labs  Lab 07/28/18 1723 07/29/18 0553  NA 139 138  K 3.6 3.3*  CL 107 107  CO2 25 23  BUN 15 14  CREATININE 1.37* 1.31*  CALCIUM 9.3 9.0  PROT 7.2  --   BILITOT 0.5  --   ALKPHOS 69  --   ALT 17  --   AST 32  --   GLUCOSE 94 93   Lab Results  Component Value Date   TROPONINI 0.05 (HH) 11/01/2017   Lab Results  Component Value Date   CHOL 240 (H) 07/29/2018   Lab Results  Component Value Date   HDL 60 07/29/2018   Lab Results  Component  Value Date   LDLCALC 161 (H) 07/29/2018   Lab Results  Component Value Date   TRIG 93 07/29/2018   Lab Results  Component Value Date   CHOLHDL 4.0 07/29/2018   No results found for: LDLDIRECT  Lab Results  Component Value  Date   DDIMER 1.03 (H) 03/18/2018     Radiology/Studies:   Ct Angio Head W Or Wo Contrast Result Date: 07/28/2018 CLINICAL DATA:  Initial evaluation for near syncope, dizziness. EXAM: CT ANGIOGRAPHY HEAD AND NECK TECHNIQUE: Multidetector CT imaging of the head and neck was performed using the standard protocol during bolus administration of intravenous contrast. Multiplanar CT image reconstructions and MIPs were obtained to evaluate the vascular anatomy. Carotid stenosis measurements (when applicable) are obtained utilizing NASCET criteria, using the distal internal carotid diameter as the denominator. CONTRAST:  OMNIPAQUE IOHEXOL 350 MG/ML SOLN COMPARISON:  Prior CT from earlier the same day. FINDINGS: CTA NECK FINDINGS Aortic arch: Visualized aortic arch of normal caliber with normal 3 vessel morphology. Mild atheromatous plaque at the origin of the left CCA without significant stenosis. No hemodynamically significant stenosis about the origin of the great vessels. Visualized subclavian arteries widely patent. Right carotid system: Right CCA patent from its origin to the bifurcation without stenosis. No significant atheromatous narrowing about the right bifurcation. Right ICA widely patent distally to the skull base without stenosis, dissection, or occlusion. Left carotid system: Left common carotid artery patent from its origin to the bifurcation without stenosis. Mild scattered calcified plaque about the left bifurcation without hemodynamically significant stenosis. Left ICA widely patent to the skull base without stenosis, dissection, or occlusion. Vertebral arteries: Both of the vertebral arteries arise from the subclavian arteries. Vertebral arteries widely patent  within the neck without stenosis, dissection, or occlusion. Skeleton: No acute osseous finding. No discrete lytic or blastic osseous lesions. Trace retrolisthesis of C5 on C6, likely chronic and degenerative. Other neck: No acute soft tissue abnormality within the neck. 1 cm hypodense right thyroid nodule noted, of doubtful significance given size and patient age. No adenopathy. Upper chest: Visualized upper chest demonstrates no acute finding. Review of the MIP images confirms the above findings CTA HEAD FINDINGS Anterior circulation: Petrous segments widely patent bilaterally. Scattered atheromatous plaque within the cavernous/supraclinoid ICAs bilaterally with mild to moderate multifocal narrowing (no more than 50%). Termini patent bilaterally. A1 segments well perfused. Normal anterior communicating artery. Anterior cerebral arteries patent to their distal aspects without stenosis. Right M1 widely patent. Multifocal moderate to severe distal left M1/proximal M2 stenoses (series 11, image 48). Distal MCA branches well perfused and symmetric. Distal small vessel atheromatous irregularity. Posterior circulation: Vertebral arteries patent to the vertebrobasilar junction without stenosis. Posterior inferior cerebral arteries patent bilaterally. Basilar patent to its distal aspect without stenosis. Superior cerebral arteries patent bilaterally. Both of the posterior cerebral arteries primarily supplied via the basilar. PCAs are widely patent to their distal aspects. Distal small vessel atheromatous irregularity. Venous sinuses: Patent. Anatomic variants: None significant. Delayed phase: No abnormal enhancement. Review of the MIP images confirms the above findings IMPRESSION: 1. Negative CTA for large vessel occlusion. 2. Multifocal moderate to severe distal left M1/proximal M2 stenoses. 3. 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 4. Distal  small vessel atheromatous irregularity throughout the intracranial arterial circulation. 5. Wide patency of the major arterial vasculature of the neck. Electronically Signed   By: Rise Mu M.D.   On: 07/28/2018 21:17     Ct Head Wo Contrast Result Date: 07/28/2018 CLINICAL DATA:  Headache. EXAM: CT HEAD WITHOUT CONTRAST TECHNIQUE: Contiguous axial images were obtained from the base of the skull through the vertex without intravenous contrast. COMPARISON:  CT scan of Sep 24, 2016. FINDINGS: Brain: Mild chronic ischemic white  matter disease is noted. No mass effect or midline shift is noted. Ventricular size is within normal limits. There is no evidence of mass lesion, hemorrhage or acute infarction. Vascular: No hyperdense vessel or unexpected calcification. Skull: Normal. Negative for fracture or focal lesion. Sinuses/Orbits: No acute finding. Other: None. IMPRESSION: Mild chronic ischemic white matter disease. No acute intracranial abnormality seen. Electronically Signed   By: Lupita Raider, M.D.   On: 07/28/2018 17:18    Mr Brain Wo Contrast Result Date: 07/29/2018 CLINICAL DATA:  RIGHT hand weakness, speech difficulties and confusion for 3 days. Suspect stroke. History of hypertension and hyperlipidemia. EXAM: MRI HEAD WITHOUT CONTRAST TECHNIQUE: Multiplanar, multiecho pulse sequences of the brain and surrounding structures were obtained without intravenous contrast. COMPARISON:  CT HEAD July 28, 2015 FINDINGS: INTRACRANIAL CONTENTS: Scattered subcentimeter reduced diffusion LEFT frontoparietal lobes with low ADC values. No susceptibility artifact to suggest hemorrhage. RIGHT parietal developmental venous anomaly. Patchy to confluent supratentorial and pontine white matter FLAIR T2 hyperintensities. No midline shift, mass effect or masses. No parenchymal brain volume loss for age. No abnormal extra-axial fluid collections. VASCULAR: Normal major intracranial vascular flow voids present at  skull base. SKULL AND UPPER CERVICAL SPINE: No abnormal sellar expansion. No suspicious calvarial bone marrow signal. Craniocervical junction maintained. SINUSES/ORBITS: Small LEFT maxillary mucosal retention cyst. Paranasal sinuses are well aerated. Included ocular globes and orbital contents are non-suspicious. OTHER: None. IMPRESSION: 1. Acute small LEFT frontoparietal/MCA territory nonhemorrhagic infarcts. 2. Moderate to severe chronic small vessel ischemic changes. Electronically Signed   By: Awilda Metro M.D.   On: 07/29/2018 00:28    12-lead ECG SR All prior EKG's in EPIC reviewed with no documented atrial fibrillation  Telemetry sinus rhythm  Assessment and Plan:  1. Cryptogenic stroke The patient presents with cryptogenic stroke.  I spoke at length with the patient about monitoring for afib with either a 30 day event monitor or an implantable loop recorder.  Risks, benefits, and alteratives to implantable loop recorder were discussed with the patient today.     Wound care was reviewed with the patient (keep incision clean and dry for 3 days).  Wound check Aaliyah Cancro be scheduled for    Please call with questions.   Sheilah Pigeon, PA-C 07/30/2018   I have seen and examined this patient with Francis Dowse.  Agree with above, note added to reflect my findings.  On exam, RRR, no murmurs, lungs clear.  Patient presented to the hospital with cryptogenic stroke. To date, no cause has been found. TEE planned for today. If unrevealing, Donnelle Olmeda plan for LINQ monitor to look for atrial fibrillation. Risks and benefits discussed. Risks include but not limited to bleeding and infection. The patient understands the risks, but wishes to discuss the procedure with her family prior to agreeing. I have told the patient that if the LINQ does not happen today then it Meryn Sarracino be implanted after the COVID 19 restrictions.  Perri Lamagna M. Joanthony Hamza MD 07/30/2018 11:38 AM

## 2018-07-30 NOTE — Telephone Encounter (Signed)
[name] put a loop recorder in this pt today, Friday 07/30/18. She is a pt of Dr. Eldridge Dace, and is looking to be discharged from the hospital either today or tomorrow. Dr. Dortha Schwalbe is the one treating her now, and he wants to have a close follow put on this patient.  Should she be scheduled for an in-office TOC, or should she be seen remotely?  Please advise

## 2018-07-30 NOTE — Discharge Instructions (Signed)
Implant site/wound care instructions °Keep incision clean and dry for 3 days. °You can remove outer dressing tomorrow. °Leave steri-strips (little pieces of tape) on until seen in the office for wound check appointment. °Call the office ([phone]) for redness, drainage, swelling, or fever. ° ° ° °PLEASE SIGN UP TO YOUR MY CHART ACCOUNT WHEN YOU GET HOME (if you aren't already).  IN THE CURRENT ENVIRONMENT WITH COVID-19, IN EFFORT TO REDUCE YOUR EXPOSURE WE WILL BE CONDUCTING MANY PATIENT VISITS BY EITHER ELECTRONIC/VIRTUAL VISITS or TELEPHONE VISITS.  BEING SIGNED UP IN YOUR MY CHART ACCOUNT  WILL HELP FACILITATE THESE VISITS AND OUR COMMUNICATION WITH YOU ° ° ° °YOUR CARDIOLOGY TEAM HAS ARRANGED FOR AN E-VISIT FOR YOUR APPOINTMENT - PLEASE REVIEW IMPORTANT INFORMATION BELOW SEVERAL DAYS PRIOR TO YOUR APPOINTMENT ° °Due to the recent COVID-19 pandemic, we are transitioning in-person office visits to tele-medicine visits in an effort to decrease unnecessary exposure to our patients and staff. Medicare and most insurances are covering these visits without a copay needed. We also encourage you to sign up for MyChart if you have not already done so. You will need a smartphone if possible. For patients that do not have this, we can still complete the visit using a regular telephone but do prefer a smartphone to enable video when possible. You may have a close family member that lives with you that can help. If possible, we also ask that you have a blood pressure cuff and scale at home to measure your blood pressure, heart rate and weight prior to your scheduled appointment. Patients with clinical needs that need an in-person evaluation and testing will still be able to come to the office if absolutely necessary. If you have any questions, feel free to call our office. ° ° ° °IF YOU HAVE A SMARTPHONE, PLEASE DOWNLOAD THE WEBEX APP TO YOUR SMARTPHONE ° °- If Apple, go to App Store and type in WebEx in the search bar.  Download [employer] Webex Meetings, the blue/green circle. The app is free but as with any other app download, your phone may require you to verify saved payment information or Apple password. You do NOT have to create a WebEx account. ° °- If Android, go to Google Play Store and type in WebEx in the search bar. Download [employer] Webex Meetings, the blue/green circle. The app is free but as with any other app download, your phone may require you to verify saved payment information or Android password. You do NOT have to create a WebEx account. ° °It is very helpful to have this downloaded before your visit. ° ° ° °2-3 DAYS BEFORE YOUR APPOINTMENT ° °You will receive a telephone call from one of our [facility] team members - your caller ID may say "Unknown caller." If this is a video visit, we will confirm that you have been able to download the WebEx app. We will remind you check your blood pressure, heart rate and weight prior to your scheduled appointment. If you have an Apple Watch or Kardia, please upload any pertinent ECG strips the day before or morning of your appointment to MyChart. Our staff will also make sure you have reviewed the consent and agree to move forward with your scheduled tele-health visit.  ° ° ° °THE DAY OF YOUR APPOINTMENT ° °Approximately 15 minutes prior to your scheduled appointment, you will receive a telephone call from one of [facility] team - your caller ID may say "Unknown caller."  Our staff will confirm medications,   vital signs for the day and any symptoms you may be experiencing. Please have this information available prior to the time of visit start. It may also be helpful for you to have a pad of paper and pen handy for any instructions given during your visit. They will also walk you through joining the WebEx smartphone meeting if this is a video visit. ° ° ° °CONSENT FOR TELE-HEALTH VISIT - PLEASE REVIEW ° °I hereby voluntarily request, consent and authorize [facility] and its  employed or contracted physicians, physician assistants, nurse practitioners or other licensed health care professionals (the Practitioner), to provide me with telemedicine health care services (the “Services") as deemed necessary by the treating Practitioner. I acknowledge and consent to receive the Services by the Practitioner via telemedicine. I understand that the telemedicine visit will involve communicating with the Practitioner through live audiovisual communication technology and the disclosure of certain medical information by electronic transmission. I acknowledge that I have been given the opportunity to request an in-person assessment or other available alternative prior to the telemedicine visit and am voluntarily participating in the telemedicine visit. ° °I understand that I have the right to withhold or withdraw my consent to the use of telemedicine in the course of my care at any time, without affecting my right to future care or treatment, and that the Practitioner or I may terminate the telemedicine visit at any time. I understand that I have the right to inspect all information obtained and/or recorded in the course of the telemedicine visit and may receive copies of available information for a reasonable fee.  I understand that some of the potential risks of receiving the Services via telemedicine include:  °• Delay or interruption in medical evaluation due to technological equipment failure or disruption; °• Information transmitted may not be sufficient (e.g. poor resolution of images) to allow for appropriate medical decision making by the Practitioner; and/or  °• In rare instances, security protocols could fail, causing a breach of personal health information. ° °Furthermore, I acknowledge that it is my responsibility to provide information about my medical history, conditions and care that is complete and accurate to the best of my ability. I acknowledge that Practitioner's advice,  recommendations, and/or decision may be based on factors not within their control, such as incomplete or inaccurate data provided by me or distortions of diagnostic images or specimens that may result from electronic transmissions. I understand that the practice of medicine is not an exact science and that Practitioner makes no warranties or guarantees regarding treatment outcomes. I acknowledge that I will receive a copy of this consent concurrently upon execution via email to the email address I last provided but may also request a printed copy by calling the office of [facility].   ° °I understand that my insurance will be billed for this visit.  ° °I have read or had this consent read to me. °• I understand the contents of this consent, which adequately explains the benefits and risks of the Services being provided via telemedicine.  °• I have been provided ample opportunity to ask questions regarding this consent and the Services and have had my questions answered to my satisfaction. °• I give my informed consent for the services to be provided through the use of telemedicine in my medical care ° °By participating in this telemedicine visit I agree to the above. ° °

## 2018-07-30 NOTE — Progress Notes (Signed)
  Date: 07/30/2018  Patient name: Jasmine Dawson  Medical record number: [id number]  Date of birth: [birth year]   I have seen and evaluated this patient and I have discussed the plan of care with the house staff. Please see Dr. Verdell Face note for complete details. I concur with his findings and plan.    Inez Catalina, MD 07/30/2018, 10:57 AM

## 2018-07-30 NOTE — Progress Notes (Signed)
° °  Subjective: HD#2   Overnight: No acute events reported  Today she endorses mild right hand weakness. Denies lightheadedness, numbness. States she didn't like breakfast. PT session went well yesterday. Discussed plan to further evaluate for a clot that could have caused her stroke.   Objective:  Vital signs in last 24 hours: Vitals:   07/29/18 1959 07/30/18 0001 07/30/18 0423 07/30/18 0841  BP: (!) 190/91 (!) 150/70 (!) 152/63 (!) 159/73  Pulse: (!) 55 (!) 57 71 (!) 56  Resp: 17 16 16 20   Temp: 97.8 F (36.6 C) 97.7 F (36.5 C) 97.8 F (36.6 C) 98.1 F (36.7 C)  TempSrc: Oral Oral Oral Oral  SpO2: 100% 96% 93% 97%  Weight:      Height:       Constitutional: No acute distress, lying comfortably in bed, speaks in full sentences. Respiratory: Clear to auscultation, no wheezes, crackles or rhonchi Cardiovascular: RRR, no murmurs, gallops, rubs Abdomen: Bowel sounds present.  Nontender to palpation Neurologic exam: Cranial Nerves: II: PERRL III, IV, VI: Extra-occular motions intact bilaterally V, VII: Face symmetric, sensation intact in all 3 divisions  IX, X: palate rises symmetrically XI: Head turn and shoulder shrug normal bilaterally  XII: tongue midline  Motor: Strength 5/5 on all upper and lower extremities, bulk muscle and tone are normal  Sensory: Light touch intact and symmetric bilaterally  Coordination: There is no dysmetria on finger-to-nose.  Psychiatric: Normal mood and affect  Assessment/Plan:  Active Problems:   Weakness   Cerebral embolism with cerebral infarction  Jasmine Dawson is a pleasant 73 year old woman with hypertension, CKD stage III, paroxysmal supraventricular tachycardia and inherited thrombophilia who presented to [facility] ED on 07/28/2018 with 3-day history of right upper extremity weakness and decreased grip strength.  She was noted to have an acute small left  frontoparietal/MCA territory nonhemorrhagic infarct on MRI.  Cryptogenic stroke: Presented with right arm and facial weakness, found to have a left frontoparietal/MCA infarct with no clear source.  CT angiography head and neck revealed moderate to severe left M1/M2 stenosis.  Echocardiogram revealed ejection fraction of 60 to 65% with no embolus in atrial appendage. - Follow-up transcranial Doppler with bubble studies -Follow-up lower extremity Doppler -Continue dual antiplatelet with aspirin 81 mg daily and Plavix 75 mg daily x3 weeks then plavix alone. -PT recommends home health physical therapy and Occupational Therapy  Hyperlipidemia: Calculated ASCVD score of 23.4% which indicates she would need statin therapy.  However, she has anaphylactic reaction with with eggs. -?Candidate for PCSK9 inhibitor  Hypertension: BP this a.m. in the 150s/80s.  Allowing permissive hypertension until this afternoon. -Continue to monitor  History of paroxysmal supraventricular tachycardia: No reports of arrhythmia during this admission.  Heart rate has been labile with range 40s-70s. 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  Requires aggressive DVT prophylaxis with immobilization - Continue subcutaneous Lovenox  CKD Stage III: Stable. -Continue to monitor  FEN: No IVfluids, regulardiet, replace electrolytes as needed VTE ppx: Subcutaneous Lovenox CODE STATUS: Full code  Dispo: Anticipated discharge in approximately 1 day.   Jasmine Rack, MD 07/30/2018, 8:59 AM Pager: [phone] IMTS PGY-1

## 2018-07-30 NOTE — Progress Notes (Signed)
LE venous duplex       has been completed. Preliminary results can be found under CV proc through chart review. Sabre Romberger, BS, RDMS, RVT   

## 2018-07-30 NOTE — Progress Notes (Signed)
Physical Therapy Treatment Patient Details Name: Jasmine Dawson MRN: [id number] DOB: 1945/07/19 Today's Date: 07/30/2018    History of Present Illness Pt is a 73 y/o female admitted secondary to R UE weakness. MRI of the brain revealed small acute L frontoparietal/MCA territory infarcts. PMH including but not limited to CKD and HTN.    PT Comments    Pt seen for mobility progression. Pt limited more this session secondary to lethargy and pt reported feeling "woozy from the medication". Continue to recommend f/u HHPT to address deficits. Pt would continue to benefit from skilled physical therapy services at this time while admitted and after d/c to address the below listed limitations in order to improve overall safety and independence with functional mobility.    Follow Up Recommendations  Home health PT     Equipment Recommendations  None recommended by PT    Recommendations for Other Services       Precautions / Restrictions Precautions Precautions: None Restrictions Weight Bearing Restrictions: No    Mobility  Bed Mobility Overal bed mobility: Modified Independent                Transfers Overall transfer level: Needs assistance Equipment used: None Transfers: Sit to/from Stand Sit to Stand: Min guard         General transfer comment: for safety  Ambulation/Gait Ambulation/Gait assistance: Min guard;Min assist Gait Distance (Feet): 30 Feet Assistive device: None;1 person hand held assist Gait Pattern/deviations: Step-through pattern;Decreased step length - right;Decreased step length - left;Decreased stride length;Drifts right/left;Narrow base of support Gait velocity: decreased   General Gait Details: pt with modest instability with in room ambulation to and from bathroom. Pt reported feeling "woozy from the medication". pt requiring frequent min A to stability, balance and safety   Stairs             Wheelchair Mobility    Modified  Rankin (Stroke Patients Only) Modified Rankin (Stroke Patients Only) Pre-Morbid Rankin Score: No symptoms Modified Rankin: Moderately severe disability     Balance Overall balance assessment: Needs assistance Sitting-balance support: Feet supported Sitting balance-Leahy Scale: Good     Standing balance support: No upper extremity supported Standing balance-Leahy Scale: Poor                              Cognition Arousal/Alertness: Lethargic;Suspect due to medications Behavior During Therapy: [facility] for tasks assessed/performed Overall Cognitive Status: Impaired/Different from baseline Area of Impairment: Attention;Safety/judgement;Awareness;Problem solving                   Current Attention Level: Selective     Safety/Judgement: Decreased awareness of deficits Awareness: Emergent Problem Solving: Difficulty sequencing        Exercises General Exercises - Lower Extremity Ankle Circles/Pumps: AROM;Both;10 reps;Seated Long Arc Quad: AROM;Strengthening;Both;10 reps;Seated Hip ABduction/ADduction: Standing;AROM;Strengthening;Both;5 reps Hip Flexion/Marching: Seated;AROM;Strengthening;Both;10 reps Mini-Sqauts: AROM;Strengthening;10 reps;Standing    General Comments        Pertinent Vitals/Pain Pain Assessment: No/denies pain    Home Living                      Prior Function            PT Goals (current goals can now be found in the care plan section) Acute Rehab PT Goals PT Goal Formulation: With patient Time For Goal Achievement: 08/12/18 Potential to Achieve Goals: Good Progress towards PT goals: Progressing toward goals  Frequency    Min 4X/week      PT Plan Current plan remains appropriate    Co-evaluation              AM-PAC PT "6 Clicks" Mobility   Outcome Measure  Help needed turning from your back to your side while in a flat bed without using bedrails?: None Help needed moving from lying on your back to  sitting on the side of a flat bed without using bedrails?: None Help needed moving to and from a bed to a chair (including a wheelchair)?: A Little Help needed standing up from a chair using your arms (e.g., wheelchair or bedside chair)?: A Little Help needed to walk in hospital room?: A Little Help needed climbing 3-5 steps with a railing? : A Little 6 Click Score: 20    End of Session   Activity Tolerance: Patient limited by lethargy Patient left: in bed;with call bell/phone within reach;with bed alarm set Nurse Communication: Mobility status PT Visit Diagnosis: Other abnormalities of gait and mobility (R26.89);Other symptoms and signs involving the nervous system (R29.898)     Time: 5750-5183 PT Time Calculation (min) (ACUTE ONLY): 14 min  Charges:  $Therapeutic Activity: 8-22 mins                     Deborah Chalk, PT, DPT  Acute Rehabilitation Services Pager [phone] Office [phone]     Alessandra Bevels Aurilla Coulibaly 07/30/2018, 3:33 PM

## 2018-07-30 NOTE — Progress Notes (Signed)
TCD bubble study completed. Sondi Desch, BS, RDMS, RVT  

## 2018-07-30 NOTE — Progress Notes (Signed)
STROKE TEAM PROGRESS NOTE  INTERVAL HISTORY States she'll doing well. Her speech has improved to baseline. She states that the weakness in the right hand andlack of dexterity is also improving she was reluctant to undergo loop recorder insertion but after discussion with me is now agreeable. Lower extremity venous Dopplers are negative for DVT Vitals:   07/30/18 0001 07/30/18 0423 07/30/18 0841 07/30/18 1233  BP: (!) 150/70 (!) 152/63 (!) 159/73 (!) 188/73  Pulse: (!) 57 71 (!) 56 (!) 59  Resp: Temp: 97.7 F (36.5 C) 97.8 F (36.6 C) 98.1 F (36.7 C) 97.7 F (36.5 C)  TempSrc: Oral Oral Oral Oral  SpO2: 96% 93% 97% 100%  Weight:      Height:        CBC:  Recent Labs  Lab 07/28/18 1723 07/29/18 0553  WBC 7.1 6.6  NEUTROABS 4.3  --   HGB 12.9 12.9  HCT 41.1 39.4  MCV 89.9 87.6  PLT 238 237    Basic Metabolic Panel:  Recent Labs  Lab 07/29/18 0553 07/30/18 1022  NA 138 138  K 3.3* 4.1  CL 107 106  CO2 23 21*  GLUCOSE 93 89  BUN 14 19  CREATININE 1.31* 1.45*  CALCIUM 9.0 9.3   Lipid Panel:     Component Value Date/Time   CHOL 240 (H) 07/29/2018 0553   TRIG 93 07/29/2018 0553   HDL 60 07/29/2018 0553   CHOLHDL 4.0 07/29/2018 0553   VLDL 19 07/29/2018 0553   LDLCALC 161 (H) 07/29/2018 0553   HgbA1c:  Lab Results  Component Value Date   HGBA1C 5.9 (H) 07/29/2018   Urine Drug Screen: No results found for: LABOPIA, COCAINSCRNUR, LABBENZ, AMPHETMU, THCU, LABBARB  Alcohol Level No results found for: ETH  IMAGING Ct Angio Head W Or Wo Contrast  Result Date: 07/28/2018 CLINICAL DATA:  Initial evaluation for near syncope, dizziness. EXAM: CT ANGIOGRAPHY HEAD AND NECK TECHNIQUE: Multidetector CT imaging of the head and neck was performed using the standard protocol during bolus administration of intravenous contrast. Multiplanar CT image reconstructions and MIPs were obtained to evaluate the vascular anatomy. Carotid stenosis measurements (when  applicable) are obtained utilizing NASCET criteria, using the distal internal carotid diameter as the denominator. CONTRAST:  OMNIPAQUE IOHEXOL 350 MG/ML SOLN COMPARISON:  Prior CT from earlier the same day. FINDINGS: CTA NECK FINDINGS Aortic arch: Visualized aortic arch of normal caliber with normal 3 vessel morphology. Mild atheromatous plaque at the origin of the left CCA without significant stenosis. No hemodynamically significant stenosis about the origin of the great vessels. Visualized subclavian arteries widely patent. Right carotid system: Right CCA patent from its origin to the bifurcation without stenosis. No significant atheromatous narrowing about the right bifurcation. Right ICA widely patent distally to the skull base without stenosis, dissection, or occlusion. Left carotid system: Left common carotid artery patent from its origin to the bifurcation without stenosis. Mild scattered calcified plaque about the left bifurcation without hemodynamically significant stenosis. Left ICA widely patent to the skull base without stenosis, dissection, or occlusion. Vertebral arteries: Both of the vertebral arteries arise from the subclavian arteries. Vertebral arteries widely patent within the neck without stenosis, dissection, or occlusion. Skeleton: No acute osseous finding. No discrete lytic or blastic osseous lesions. Trace retrolisthesis of C5 on C6, likely chronic and degenerative. Other neck: No acute soft tissue abnormality within the neck. 1 cm hypodense right thyroid nodule noted, of doubtful significance given size and patient  age. No adenopathy. Upper chest: Visualized upper chest demonstrates no acute finding. Review of the MIP images confirms the above findings CTA HEAD FINDINGS Anterior circulation: Petrous segments widely patent bilaterally. Scattered atheromatous plaque within the cavernous/supraclinoid ICAs bilaterally with mild to moderate multifocal narrowing (no more than 50%). Termini  patent bilaterally. A1 segments well perfused. Normal anterior communicating artery. Anterior cerebral arteries patent to their distal aspects without stenosis. Right M1 widely patent. Multifocal moderate to severe distal left M1/proximal M2 stenoses (series 11, image 48). Distal MCA branches well perfused and symmetric. Distal small vessel atheromatous irregularity. Posterior circulation: Vertebral arteries patent to the vertebrobasilar junction without stenosis. Posterior inferior cerebral arteries patent bilaterally. Basilar patent to its distal aspect without stenosis. Superior cerebral arteries patent bilaterally. Both of the posterior cerebral arteries primarily supplied via the basilar. PCAs are widely patent to their distal aspects. Distal small vessel atheromatous irregularity. Venous sinuses: Patent. Anatomic variants: None significant. Delayed phase: No abnormal enhancement. Review of the MIP images confirms the above findings IMPRESSION: 1. Negative CTA for large vessel occlusion. 2. Multifocal moderate to severe distal left M1/proximal M2 stenoses. 3. 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 4. Distal small vessel atheromatous irregularity throughout the intracranial arterial circulation. 5. Wide patency of the major arterial vasculature of the neck. Electronically Signed   By: Rise Mu M.D.   On: 07/28/2018 21:17   Ct Head Wo Contrast  Result Date: 07/28/2018 CLINICAL DATA:  Headache. EXAM: CT HEAD WITHOUT CONTRAST TECHNIQUE: Contiguous axial images were obtained from the base of the skull through the vertex without intravenous contrast. COMPARISON:  CT scan of Sep 24, 2016. FINDINGS: Brain: Mild chronic ischemic white matter disease is noted. No mass effect or midline shift is noted. Ventricular size is within normal limits. There is no evidence of mass lesion, hemorrhage or acute infarction. Vascular: No  hyperdense vessel or unexpected calcification. Skull: Normal. Negative for fracture or focal lesion. Sinuses/Orbits: No acute finding. Other: None. IMPRESSION: Mild chronic ischemic white matter disease. No acute intracranial abnormality seen. Electronically Signed   By: Lupita Raider, M.D.   On: 07/28/2018 17:18   Ct Angio Neck W And/or Wo Contrast  Result Date: 07/28/2018 CLINICAL DATA:  Initial evaluation for near syncope, dizziness. EXAM: CT ANGIOGRAPHY HEAD AND NECK TECHNIQUE: Multidetector CT imaging of the head and neck was performed using the standard protocol during bolus administration of intravenous contrast. Multiplanar CT image reconstructions and MIPs were obtained to evaluate the vascular anatomy. Carotid stenosis measurements (when applicable) are obtained utilizing NASCET criteria, using the distal internal carotid diameter as the denominator. CONTRAST:  OMNIPAQUE IOHEXOL 350 MG/ML SOLN COMPARISON:  Prior CT from earlier the same day. FINDINGS: CTA NECK FINDINGS Aortic arch: Visualized aortic arch of normal caliber with normal 3 vessel morphology. Mild atheromatous plaque at the origin of the left CCA without significant stenosis. No hemodynamically significant stenosis about the origin of the great vessels. Visualized subclavian arteries widely patent. Right carotid system: Right CCA patent from its origin to the bifurcation without stenosis. No significant atheromatous narrowing about the right bifurcation. Right ICA widely patent distally to the skull base without stenosis, dissection, or occlusion. Left carotid system: Left common carotid artery patent from its origin to the bifurcation without stenosis. Mild scattered calcified plaque about the left bifurcation without hemodynamically significant stenosis. Left ICA widely patent to the skull base without stenosis, dissection, or occlusion. Vertebral arteries: Both of the  vertebral arteries arise from the subclavian arteries.  Vertebral arteries widely patent within the neck without stenosis, dissection, or occlusion. Skeleton: No acute osseous finding. No discrete lytic or blastic osseous lesions. Trace retrolisthesis of C5 on C6, likely chronic and degenerative. Other neck: No acute soft tissue abnormality within the neck. 1 cm hypodense right thyroid nodule noted, of doubtful significance given size and patient age. No adenopathy. Upper chest: Visualized upper chest demonstrates no acute finding. Review of the MIP images confirms the above findings CTA HEAD FINDINGS Anterior circulation: Petrous segments widely patent bilaterally. Scattered atheromatous plaque within the cavernous/supraclinoid ICAs bilaterally with mild to moderate multifocal narrowing (no more than 50%). Termini patent bilaterally. A1 segments well perfused. Normal anterior communicating artery. Anterior cerebral arteries patent to their distal aspects without stenosis. Right M1 widely patent. Multifocal moderate to severe distal left M1/proximal M2 stenoses (series 11, image 48). Distal MCA branches well perfused and symmetric. Distal small vessel atheromatous irregularity. Posterior circulation: Vertebral arteries patent to the vertebrobasilar junction without stenosis. Posterior inferior cerebral arteries patent bilaterally. Basilar patent to its distal aspect without stenosis. Superior cerebral arteries patent bilaterally. Both of the posterior cerebral arteries primarily supplied via the basilar. PCAs are widely patent to their distal aspects. Distal small vessel atheromatous irregularity. Venous sinuses: Patent. Anatomic variants: None significant. Delayed phase: No abnormal enhancement. Review of the MIP images confirms the above findings IMPRESSION: 1. Negative CTA for large vessel occlusion. 2. Multifocal moderate to severe distal left M1/proximal M2 stenoses. 3. Carotid siphon atherosclerotic change with resultant mild to moderate multifocal stenoses (up to  approximately 50%). 4. Distal small vessel atheromatous irregularity throughout the intracranial arterial circulation. 5. Wide patency of the major arterial vasculature of the neck. Electronically Signed   By: Rise Mu M.D.   On: 07/28/2018 21:17   Mr Brain Wo Contrast  Result Date: 07/29/2018 CLINICAL DATA:  RIGHT hand weakness, speech difficulties and confusion for 3 days. Suspect stroke. History of hypertension and hyperlipidemia. EXAM: MRI HEAD WITHOUT CONTRAST TECHNIQUE: Multiplanar, multiecho pulse sequences of the brain and surrounding structures were obtained without intravenous contrast. COMPARISON:  CT HEAD July 28, 2015 FINDINGS: INTRACRANIAL CONTENTS: Scattered subcentimeter reduced diffusion LEFT frontoparietal lobes with low ADC values. No susceptibility artifact to suggest hemorrhage. RIGHT parietal developmental venous anomaly. Patchy to confluent supratentorial and pontine white matter FLAIR T2 hyperintensities. No midline shift, mass effect or masses. No parenchymal brain volume loss for age. No abnormal extra-axial fluid collections. VASCULAR: Normal major intracranial vascular flow voids present at skull base. SKULL AND UPPER CERVICAL SPINE: No abnormal sellar expansion. No suspicious calvarial bone marrow signal. Craniocervical junction maintained. SINUSES/ORBITS: Small LEFT maxillary mucosal retention cyst. Paranasal sinuses are well aerated. Included ocular globes and orbital contents are non-suspicious. OTHER: None. IMPRESSION: 1. Acute small LEFT frontoparietal/MCA territory nonhemorrhagic infarcts. 2. Moderate to severe chronic small vessel ischemic changes. Electronically Signed   By: Awilda Metro M.D.   On: 07/29/2018 00:28   Vas [place] Lower Extremity Venous (dvt)  Result Date: 07/30/2018  Lower Venous Study Indications: Stroke.  Performing Technologist: Jeb Levering RDMS, RVT  Examination Guidelines: A complete evaluation includes B-mode imaging, spectral Doppler,  color Doppler, and power Doppler as needed of all accessible portions of each vessel. Bilateral testing is considered an integral part of a complete examination. Limited examinations for reoccurring indications may be performed as noted.  Right Venous Findings: +---------+---------------+---------+-----------+----------+-------+          CompressibilityPhasicitySpontaneityPropertiesSummary +---------+---------------+---------+-----------+----------+-------+ CFV  Full           Yes      Yes                          +---------+---------------+---------+-----------+----------+-------+ SFJ      Full                                                 +---------+---------------+---------+-----------+----------+-------+ FV Prox  Full                                                 +---------+---------------+---------+-----------+----------+-------+ FV Mid   Full                                                 +---------+---------------+---------+-----------+----------+-------+ FV DistalFull                                                 +---------+---------------+---------+-----------+----------+-------+ PFV      Full                                                 +---------+---------------+---------+-----------+----------+-------+ POP      Full           Yes      Yes                          +---------+---------------+---------+-----------+----------+-------+ PTV      Full                                                 +---------+---------------+---------+-----------+----------+-------+ PERO     Full                                                 +---------+---------------+---------+-----------+----------+-------+  Left Venous Findings: +---------+---------------+---------+-----------+----------+-------+          CompressibilityPhasicitySpontaneityPropertiesSummary +---------+---------------+---------+-----------+----------+-------+ CFV       Full           Yes      Yes                          +---------+---------------+---------+-----------+----------+-------+ SFJ      Full                                                 +---------+---------------+---------+-----------+----------+-------+ FV Prox  Full                                                 +---------+---------------+---------+-----------+----------+-------+ FV Mid   Full                                                 +---------+---------------+---------+-----------+----------+-------+ FV DistalFull                                                 +---------+---------------+---------+-----------+----------+-------+ PFV      Full                                                 +---------+---------------+---------+-----------+----------+-------+ POP      Full           Yes      Yes                          +---------+---------------+---------+-----------+----------+-------+ PTV      Full                                                 +---------+---------------+---------+-----------+----------+-------+ PERO     Full                                                 +---------+---------------+---------+-----------+----------+-------+    Summary: Right: There is no evidence of deep vein thrombosis in the lower extremity. No cystic structure found in the popliteal fossa. Left: There is no evidence of deep vein thrombosis in the lower extremity. No cystic structure found in the popliteal fossa.  *See table(s) above for measurements and observations.    Preliminary    2D Echocardiogram   1. The left ventricle has normal systolic function with an ejection fraction of 60-65%. The cavity size was normal. There is moderately increased left ventricular wall thickness. Left ventricular diastolic Doppler parameters are consistent with impaired  relaxation. Indeterminate filling pressures The E/e' is 8-15. No evidence of left ventricular  regional wall motion abnormalities.  2. The right ventricle has normal systolic function. The cavity was normal. There is no increase in right ventricular wall thickness.  3. The mitral valve is degenerative. Mild thickening of the mitral valve leaflet. There is mild mitral annular calcification present.  4. The tricuspid valve is grossly normal.  5. The aortic valve is tricuspid. Mild calcification of the aortic valve. Aortic valve regurgitation is trivial by color flow Doppler.   PHYSICAL EXAM Pleasant mildly obese elderly lady not in distress. . Afebrile. Head is nontraumatic. Neck is supple without bruit.    Cardiac exam no murmur or gallop. Lungs are clear to  auscultation. Distal pulses are well felt. Neurological Exam ;  Awake  Alert oriented x 3. Normal speech and language.eye movements full without nystagmus.fundi were not visualized. Vision acuity and fields appear normal. Hearing is normal. Palatal movements are normal. Face symmetric. Tongue midline. Normal strength, tone, reflexes and coordination.diminished fine finger movements on the right. Orbits left over right upper extremity. Mild weakness of right grip and intrinsic hand muscles. Normal sensation. Gait deferred.  ASSESSMENT/PLAN Ms. Jasmine Dawson is a 73 y.o. female with history of CKD stage III, HTN, HLD, Pulm HTN, SVT presenting with R arm and facial weakness.   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 Check TCD bubble and LE doppler. If neg, place ILR.   CT head mildly chronic WMD. No acute stroke.  CTA head & neck no LVO. Mod to severe L M1/M2 stenosis. ICA atherosclerosis up to 50%. Mild SVD. Neck patent  MRI  Small L FP/MCA infarct. Mod to severe SVD.  2D Echo EF 60-65%. No source of embolus   TCD bubble pending   LE  dopplers negative for DVT  LDL 161  HgbA1c 5.9  Lovenox 40 mg sq daily for VTE prophylaxis  No antithrombotic prior to admission, now on aspirin 81 mg daily and clopidogrel 75 mg daily following load. Continue DAPT x 3 weeks then PLAVIX alone (as she has GERD on aspirin)  Therapy recommendations:  HH PT and OT  Disposition: home  Hypertension  Stable . Permissive hypertension (OK if < 220/120) but gradually normalize in 5-7 days . 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   lower extremity venous Doppler were negative for DVT and transcranial Doppler bubble study for PFO is pending. Greater than 50% time during this 25 minute visit was spent on counseling and coordination of care for embolic stroke and discussion about evaluation and treatment plan. Likely discharge home after loop recorder. Follow-up as an outpatient in stroke clinic in 6 weeks. Delia Heady, MD Medical Director [facility] Pager: [phone] 07/30/2018 1:30 PM   To contact Stroke Continuity provider, please refer to [email or web address]. After hours, contact General Neurology

## 2018-07-31 DIAGNOSIS — Z7982 Long term (current) use of aspirin: Secondary | ICD-10-CM

## 2018-07-31 DIAGNOSIS — Z7902 Long term (current) use of antithrombotics/antiplatelets: Secondary | ICD-10-CM

## 2018-07-31 DIAGNOSIS — Z91018 Allergy to other foods: Secondary | ICD-10-CM

## 2018-07-31 MED ORDER — ROSUVASTATIN CALCIUM 40 MG PO TABS: 40.0000 mg | ORAL_TABLET | Freq: Every day | ORAL | 3 refills | Status: DC

## 2018-07-31 MED ORDER — CLOPIDOGREL BISULFATE 75 MG PO TABS: 75.0000 mg | ORAL_TABLET | Freq: Every day | ORAL | 3 refills | Status: DC

## 2018-07-31 MED ORDER — ATENOLOL 50 MG PO TABS: 25.0000 mg | ORAL_TABLET | Freq: Every day | ORAL | 3 refills | Status: DC

## 2018-07-31 MED ORDER — ATENOLOL 25 MG PO TABS
25.0000 mg | ORAL_TABLET | Freq: Every day | ORAL | Status: DC
  Administered 2018-07-31: 09:00:00 25 mg via ORAL
  Filled 2018-07-31: qty 1

## 2018-07-31 MED ORDER — ASPIRIN 81 MG PO TBEC: 81.0000 mg | DELAYED_RELEASE_TABLET | Freq: Every day | ORAL | 0 refills | Status: AC

## 2018-07-31 MED ORDER — CLONIDINE HCL 0.1 MG PO TABS: 0.1000 mg | ORAL_TABLET | Freq: Every day | ORAL | 3 refills | Status: DC

## 2018-07-31 NOTE — Progress Notes (Signed)
   Subjective: HD#3   Overnight: No acute events reported.  She underwent implantable loop recorder placement, lower extremity Doppler and transcranial Doppler with no difficulties.  Today, Jasmine Dawson was examined at bedside and reports she is doing well besides not being able to sleep in the hospital setting.  She also tells me that she had realized she is been off atenolol as she had brief intermittent episodes of palpitation however denies chest pain, shortness of breath, dizziness, lightheadedness, nausea or vomiting.  I explained to her in depth regarding her hospital course and the image findings as well as her hospital course and she expressed understanding.  Objective:  Vital signs in last 24 hours: Vitals:   07/30/18 1630 07/30/18 2015 07/30/18 2349 07/31/18 0412  BP: (!) 154/72 (!) 157/66 (!) 147/77 (!) 151/85  Pulse: 62 (!) 59 (!) 57 74  Resp: 20 16 16 17   Temp: 97.9 F (36.6 C) 98.4 F (36.9 C) (!) 97.4 F (36.3 C) 97.7 F (36.5 C)  TempSrc: Oral Oral Oral Oral  SpO2: 97% 99% 100% 96%  Weight:      Height:       General: In no acute distress, lying comfortably in bed Respiratory: CTA BL, no wheezes, crackles, rhonchi Cardio: RRR, no murmurs, gallops, rubs Neuro: Cranial nerves II to XII intact with no motor or sensory deficits.  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 on 07/28/2018 with 3-day history of right upper extremity weakness and decreased grip strength. She was noted to have an acute small left frontoparietal/MCA territory nonhemorrhagic infarct on MRI.  Cryptogenic stroke: She has had a very remarkable clinical course since her admission and is currently without any focal neurological  deficits.  She is status post left lower extremity Doppler which did not reveal any evidence of DVT, transcranial Doppler ultrasound with did not reveal a PFO and an implantable loop recorder which she tolerated the procedure pretty well. - Likely discharge today with dual antiplatelets (aspirin 81 mg daily and Plavix and 5 mg daily for 3 weeks then Plavix alone) - Home health PT/OT  Hyperlipidemia: Given her LDL in the 160s and ASCVD score of 23.4%.  She was started on high intensity statin (Crestor 40 mg daily) -Continue Crestor 40 mg daily  Hypertension: BP stable in the 150s/80s. -Resumed home clonidine 0.1 mg daily  Hx of SVT: No evidence of arrhythmia during this admission.  Her heart rate has finally peaked in the 70s this morning.  On review of telemetry, she had episodes of PVCs however denied chest pain, shortness of breath, dizziness, lightheadedness, nausea, vomiting. - Start half dose of atenolol 25 mg daily and most likely discharged on half dose of atenolol - There is a follow-up appointment with electrophysiology that has been scheduled  Inherited thrombophilia: No evidence of DVT on lower extremity Doppler.  Per note, she tested positive for factor V and prothrombin gene. -Continue subcutaneous Lovenox  CKD stage III: Stable  FEN:No IVfluids, regulardiet, replace electrolytes as needed VTE ppx:SubcutaneousLovenox CODE STATUS:Full code  Dispo: Anticipated discharge in approximately today with [facility] PT/OT  Jasmine Dawson, Jasmine Dawson 07/31/2018, 6:20 AM Pager: [phone] IMTS PGY-1

## 2018-07-31 NOTE — Progress Notes (Signed)
AVS reviewed with patient and patient given a copy to take home. All lines removed, patient dressed, belongings packed, and DME at bedside. Patient taken via wheelchair by CNA to husband's car for discharge.

## 2018-07-31 NOTE — Care Management (Signed)
Spoke w patient over the phone, she has DME3/1 in room to take home, notified [facility] of DC oday. No other CM needs identified.

## 2018-07-31 NOTE — Progress Notes (Signed)
Physical Therapy Treatment Patient Details Name: Jasmine Dawson MRN: [id number] DOB: [birth year] Today's Date: 07/31/2018    History of Present Illness Pt is a 73 y/o female admitted secondary to R UE weakness. MRI of the brain revealed small acute L frontoparietal/MCA territory infarcts. PMH including but not limited to CKD and HTN.    PT Comments    Pt making steady progress with functional mobility, demonstrating improved stability and endurance with ambulation. Pt would continue to benefit from skilled physical therapy services at this time while admitted and after d/c to address the below listed limitations in order to improve overall safety and independence with functional mobility.    Follow Up Recommendations  Home health PT     Equipment Recommendations  None recommended by PT    Recommendations for Other Services       Precautions / Restrictions Precautions Precautions: None Restrictions Weight Bearing Restrictions: No    Mobility  Bed Mobility Overal bed mobility: Modified Independent                Transfers Overall transfer level: Needs assistance Equipment used: None Transfers: Sit to/from Stand Sit to Stand: Supervision         General transfer comment: for safety  Ambulation/Gait Ambulation/Gait assistance: Supervision;Min guard Gait Distance (Feet): 150 Feet Assistive device: None Gait Pattern/deviations: Step-through pattern;Decreased stride length;Drifts right/left Gait velocity: decreased   General Gait Details: pt with mild instability but no overt LOB or need for physical assistance; greatly improved gait pattern and endurance since previous session   Stairs             Wheelchair Mobility    Modified Rankin (Stroke Patients Only) Modified Rankin (Stroke Patients Only) Pre-Morbid Rankin Score: No symptoms Modified Rankin: Moderately severe disability     Balance Overall balance assessment: Needs  assistance Sitting-balance support: Feet supported Sitting balance-Leahy Scale: Good     Standing balance support: No upper extremity supported Standing balance-Leahy Scale: Fair                              Cognition Arousal/Alertness: Awake/alert Behavior During Therapy: WFL for tasks assessed/performed Overall Cognitive Status: Within Functional Limits for tasks assessed                                        Exercises      General Comments        Pertinent Vitals/Pain Pain Assessment: No/denies pain    Home Living                      Prior Function            PT Goals (current goals can now be found in the care plan section) Acute Rehab PT Goals PT Goal Formulation: With patient Time For Goal Achievement: 08/12/18 Potential to Achieve Goals: Good Progress towards PT goals: Progressing toward goals    Frequency    Min 4X/week      PT Plan Current plan remains appropriate    Co-evaluation              AM-PAC PT "6 Clicks" Mobility   Outcome Measure  Help needed turning from your back to your side while in a flat bed without using bedrails?: None Help needed moving from lying on your back to sitting  on the side of a flat bed without using bedrails?: None Help needed moving to and from a bed to a chair (including a wheelchair)?: A Little Help needed standing up from a chair using your arms (e.g., wheelchair or bedside chair)?: A Little Help needed to walk in hospital room?: A Little Help needed climbing 3-5 steps with a railing? : A Little 6 Click Score: 20    End of Session   Activity Tolerance: Patient tolerated treatment well Patient left: in chair;with call bell/phone within reach;with chair alarm set Nurse Communication: Mobility status PT Visit Diagnosis: Other abnormalities of gait and mobility (R26.89);Other symptoms and signs involving the nervous system (P79.432)     Time: 7614-7092 PT Time  Calculation (min) (ACUTE ONLY): 17 min  Charges:  $Gait Training: 8-22 mins                     Deborah Chalk, [place], DPT  Acute Rehabilitation Services Pager [phone] Office [phone]     Alessandra Bevels Adger Cantera 07/31/2018, 11:39 AM

## 2018-07-31 NOTE — Progress Notes (Signed)
  Date: 07/31/2018  Patient name: Jasmine Dawson  Medical record number: [id number]  Date of birth: 10-10-1945   I have seen and evaluated this patient and I have discussed the plan of care with the house staff. Please see Dr. Verdell Face note for complete details. I concur with his and plan.  Jasmine Dawson will be discharged home today with plan to follow up in Neurology in 6 weeks.  She had a loop recorder placed yesterday.  She reports that she is doing very well and all of her questions were answered by Dr. Dortha Schwalbe.   Inez Catalina, MD 07/31/2018, 12:32 PM

## 2018-08-02 ENCOUNTER — Encounter (HOSPITAL_COMMUNITY): Payer: Self-pay | Admitting: Cardiology

## 2018-08-02 NOTE — Telephone Encounter (Signed)
Informed Dr. 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  He verbalized understanding.

## 2018-08-06 ENCOUNTER — Telehealth: Payer: Self-pay | Admitting: Nurse Practitioner

## 2018-08-06 NOTE — Telephone Encounter (Signed)
Called patient to set up Mychart and to cancel appointment for 08/09/2018 - have patient take picture of wound site and send through mychart to amber seiler.    Patient voicemail is full and there was no answer on cellphone.

## 2018-08-09 ENCOUNTER — Other Ambulatory Visit: Payer: Self-pay

## 2018-08-09 ENCOUNTER — Ambulatory Visit: Payer: Medicare Other

## 2018-08-23 ENCOUNTER — Telehealth: Payer: Self-pay | Admitting: Physician Assistant

## 2018-08-23 ENCOUNTER — Encounter: Payer: Self-pay | Admitting: Physician Assistant

## 2018-08-23 NOTE — Progress Notes (Signed)
Virtual Visit via Telephone Note   This visit type was conducted due to national recommendations for restrictions regarding the COVID-19 Pandemic (e.g. social distancing) in an effort to limit this patient's exposure and mitigate transmission in our community.  Due to her co-morbid illnesses, this patient is at least at moderate risk for complications without adequate follow up.  This format is felt to be most appropriate for this patient at this time.  The patient did not have access to video technology/had technical difficulties with video requiring transitioning to audio format only (telephone).  All issues noted in this document were discussed and addressed.  No physical exam could be performed with this format.  Please refer to the patient's chart for her  consent to telehealth for [facility].   Evaluation Performed:  Follow-up visit  Date:  08/24/2018   ID:  Jasmine, Dawson January 08, 1946, MRN [id number]  Patient Location: Home Provider Location: Home  PCP:  Leilani Able, MD  Cardiologist:  Lance Muss, MD  Electrophysiologist:  Regan Lemming, MD   Chief Complaint: f/u SVT, also recent stroke  History of Present Illness:    Jasmine Dawson is a 73 y.o. female with paroxysmal SVT, occasional PVCs, HTN, HLD, CKD III, prior tricuspid regurgitation and pulmonary HTN, prothrombin gene mutation, heterozygous factor V Leiden, family history of clotting, and recent stroke who presents for cardiac f/u.   Jasmine Dawson has a longstanding history of paroxysmal SVT. Typically this resolves in a few minutes and responds to Valsalva maneuver. In 10/2017 she was readmitted with an episode that persisted. 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 Bystolic was changed to metoprolol. 2D echo 11/01/17 showed EF 65-70%, grade 1 DD, mod-severe TR, moderately increased PASP . Her labs had shown Hgb 11.8, Cr 1.54, K 4.1, TSH wnl, troponins negative, d-dimer 9.18. CT angio  10/31/17 had shown no PE, + mild cardiomegaly with trace pleural effusions. She had breakthrough SVT on metoprolol so this was changed to atenolol. Regarding her TR, pulmonary HTN, and family history of clotting, I had reviewed with Dr. Delton See (DOD) and we proceeded with repeat echo and hypercoag workup. Repeat echo interestingly had shown only mild TR and lower PA pressure so CTEPH was felt less likely. 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 I referred her to hematology. She saw Dr. Clelia Croft. Per his notes, "At this point, I do not recommend any anticoagulation unless she develops blood clots in the future." Aggressive DVT prophylaxis was discussed. She was admitted early 07/2018 with left face tingling and right hand weakness and found to have left frontal parietal/MCA infarct felt embolic. 2D echo 07/30/18 showed EF 60-65%, impaired diastolic relaxation, mild TR. LE duplex was negative. TEE was deferred due to Covid-19. TCD was unremarkable. She underwent loop recorder insertion 07/30/18 by the EP team. Neurology recommended ASA  daily plus Plavix  x 3 weeks then Plavix alone indefinitely. Labs otherwise showed Cr 1.45 (stable), normal CBC, LDL 161, normal LFTs.  She returns for follow-up via telehealth today feeling well from a cardiac standpoint. A tree fell on her house a few months ago so she's living in a rental. She had some difficulty writing in the days following her stroke but this seems to be improving. No CP, SOB, palpitations, dizziness, or syncope reported. She was advised to reduce her atenolol to  at discharge from the hospital due to bradycardia although  she did not change this as she wanted to talk to [place] about it. Her HR was reported to be 40s-70s with the last 2 inpatient days' vitals ranging 55-74. She had also been started on clonidine in the hospital. She's been monitoring her HR post-discharge  and it's been running 55-58 on prior  dose of atenolol without any breakthrough SVT symptoms. She has not been taking clonidine at home as she does not see that she had received this from the pharmacy. Our nurse did confirm she picked it up 3/27, however, she is certain it is not in her med box so will be checking her family's as well. Her BP is running 135-141 systolic. She is actively trying to lose weight and is down several lb. As a result she's has been noticing a gentle downtrend of her BP with this as well. She is no longer taking the triamterene/HCTZ prn fluid retention because this has resolved with weight loss as well. She tried to have a Zoom appt with PCP but was unsuccessful, so had many unanswered questions about her stroke which we discussed today to the best of my ability based on records. The patient does not have symptoms concerning for COVID-19 infection (fever, chills, cough, or new shortness of breath). She reports her ILR site looks good with only one small scab remaining but otherwise no drainage or dehiscence.    Past Medical History:  Diagnosis Date  . CKD (chronic kidney disease), stage III (HCC)   . Heterozygous factor V Leiden mutation (HCC)   . Hyperlipidemia   . Hypertension   . Motion sickness   . Prothrombin gene mutation (HCC)   . Pulmonary hypertension (HCC)    a. 2D echo 11/01/17 showed EF 65-70%, grade 1 DD, mod-severe TR, moderately increased PASP .  [name] PVC's (premature ventricular contractions)    occasional PVCs  . Sinus bradycardia    a. requiring reduction in beta blocker in 2020.  [name] Stroke (cerebrum) (HCC)   . SVT (supraventricular tachycardia) (HCC)   . Tricuspid regurgitation    Past Surgical History:  Procedure Laterality Date  . HYSTEROSCOPY W/D&C  11/07/2011   Procedure: DILATATION AND CURETTAGE /HYSTEROSCOPY;  Surgeon: Antionette Char, MD;  Location: [facility];  Service: Gynecology;  Laterality: N/A;  endometrial polypectomy  . LOOP  RECORDER INSERTION N/A 07/30/2018   Procedure: LOOP RECORDER INSERTION;  Surgeon: Regan Lemming, MD;  Location: [facility] INVASIVE [facility];  Service: Cardiovascular;  Laterality: N/A;  . TUBAL LIGATION  1977     Current Meds  Medication Sig  . atenolol (TENORMIN) 50 MG tablet Take 50 mg by mouth daily.  . clopidogrel (PLAVIX) 75 MG tablet Take 1 tablet (75 mg total) by mouth daily.  . famotidine (PEPCID) 20 MG tablet Take 1 tablet (20 mg total) by mouth 2 (two) times daily.  . rosuvastatin (CRESTOR) 40 MG tablet Take 1 tablet (40 mg total) by mouth daily at 6 PM.     Allergies:   Eggs or egg-derived products; Orange fruit [citrus]; Prednisone; Sulfa antibiotics; Tomato; Influenza vaccines; Aspartame and phenylalanine; Caffeine; and Shellfish allergy   Social History   Tobacco Use  . Smoking status: Never Smoker  . Smokeless tobacco: Never Used  Substance Use Topics  . Alcohol use: No  . Drug use: No     Family Hx: The patient's family history includes Diabetes in her mother; Heart attack in her father and mother.  ROS:   Please see the history of present illness.  All other systems reviewed and are negative.   Prior CV studies:   Outlined above  Labs/Other Tests and Data Reviewed:    EKG:  An ECG dated 07/28/18 was personally reviewed today and demonstrated:  NSR without acute STT changes  Recent Labs: 03/18/2018: TSH 2.000 07/28/2018: ALT 17 07/29/2018: Hemoglobin 12.9; Platelets 237 07/30/2018: BUN 19; Creatinine, Ser 1.45; Potassium 4.1; Sodium 138   Recent Lipid Panel Lab Results  Component Value Date/Time   CHOL 240 (H) 07/29/2018 05:53 AM   TRIG 93 07/29/2018 05:53 AM   HDL 60 07/29/2018 05:53 AM   CHOLHDL 4.0 07/29/2018 05:53 AM   LDLCALC 161 (H) 07/29/2018 05:53 AM    Wt Readings from Last 3 Encounters:  08/24/18 185 lb (83.9 kg)  07/28/18 185 lb 10 oz (84.2 kg)  05/14/18 196 lb 9.6 oz (89.2 kg)     Objective:    Vital Signs:  BP (!) 141/73   Pulse (!)  55   Ht 5' 3.5" (1.613 m)   Wt 185 lb (83.9 kg)   BMI 32.26 kg/m    General - pleasant female in no acute distress Pulm - No labored breathing, no coughing during visit, no audible wheezing, speaking in full sentences Neuro - A+Ox3, no slurred speech, answers questions appropriately Psych - Pleasant affect   ASSESSMENT & PLAN:    1. Paroxysmal SVT, recently with bradycardia - she has been self-monitoring HR at home and HR has been stable on atenolol 50mg . Given that this seems to be suppressing her SVT as well, will continue present dose. ILR will help us monitor her average HR as well. She is not having any symptoms of bradycardia. Would keep off clonidine for now as this could exacerbate bradycardia. 2. History of stroke with ILR in place - I asked our nurse to reach out to device clinic on how they want to handle missed wound check, and also to contact the patient to go over instructions on how they plan to monitor from home. She felt information was coming at her too fast in the hospital to have a good handle on what to do. She has f/u with neurology in 09/2018 as well. 3. Essential HTN - BP controlled today. Will continue atenolol 50mg  daily for now as above. She states she is not on the newly rx'd clonidine and never was. Given her bradycardia in the hospital I don't think this is a great choice when used with atenolol. She typically has been checking her BP closer to when she takes her meds. I asked her to check it mid-day the next few days and call with readings. Amlodipine at low dose might be a good choice to augment her therapy if needed. I also suspect her BP will improve as she continues to lose weight as well. She seems motivated at this. 4. Hyperlipidemia - she will follow with primary care for recheck. She attributes high cholesterol recently to poor eating habits prior to stroke.  COVID-19 Education: The signs and symptoms of COVID-19 were discussed with the patient and how to  seek care for testing (follow up with PCP or arrange E-visit).  The importance of social distancing was discussed today.  Time:   Today, I have spent 21 minutes with the patient with telehealth technology discussing the above problems.     Medication Adjustments/Labs and Tests Ordered: Current medicines are reviewed at length with the patient today.  Concerns regarding medicines are outlined above.   Disposition:  Follow up in  1 year with Dr. Eldridge Dace. ILR will be followed remotely by EP. She was encouraged to reach out to PCP to discuss when they plan to reconnect for routine follow-up given failed virtual visit recently. She also has f/u 09/2018 with neurology.  Signed, Laurann Montana, PA-C  08/24/2018 11:16 AM    [facility]

## 2018-08-23 NOTE — Telephone Encounter (Signed)
Patient set up for MyChart?  Sent link she will work on getting it set up  Is patient using Smartphone/computer/tablet? no  Did audio/video work?no  Does patient need telephone visit?yes  Best phone number to use? [phone]  Special Instructions? Pt will have weight, bp and medication ready for call      Virtual Visit Pre-Appointment Phone Call  "(Name), I am calling you today to discuss your upcoming appointment. We are currently trying to limit exposure to the virus that causes COVID-19 by seeing patients at home rather than in the office."  1. "What is the BEST phone number to call the day of the visit?" - include this in appointment notes  2. Do you have or have access to (through a family member/friend) a smartphone with video capability that we can use for your visit?" a. If yes - list this number in appt notes as cell (if different from BEST phone #) and list the appointment type as a VIDEO visit in appointment notes b. If no - list the appointment type as a PHONE visit in appointment notes  3. Confirm consent - "In the setting of the current Covid19 crisis, you are scheduled for a (phone or video) visit with your provider on (date) at (time).  Just as we do with many in-office visits, in order for you to participate in this visit, we must obtain consent.  If you'd like, I can send this to your mychart (if signed up) or email for you to review.  Otherwise, I can obtain your verbal consent now.  All virtual visits are billed to your insurance company just like a normal visit would be.  By agreeing to a virtual visit, we'd like you to understand that the technology does not allow for your provider to perform an examination, and thus may limit your provider's ability to fully assess your condition. If your provider identifies any concerns that need to be evaluated in person, we will make arrangements to do so.  Finally, though the technology is pretty good, we cannot assure that it  will always work on either your or our end, and in the setting of a video visit, we may have to convert it to a phone-only visit.  In either situation, we cannot ensure that we have a secure connection.  Are you willing to proceed?" STAFF: Did the patient verbally acknowledge consent to telehealth visit? Document YES/NO here: yes telephone call   4. Advise patient to be prepared - "Two hours prior to your appointment, go ahead and check your blood pressure, pulse, oxygen saturation, and your weight (if you have the equipment to check those) and write them all down. When your visit starts, your provider will ask you for this information. If you have an Apple Watch or Kardia device, please plan to have heart rate information ready on the day of your appointment. Please have a pen and paper handy nearby the day of the visit as well."  5. Give patient instructions for MyChart download to smartphone OR Doximity/Doxy.me as below if video visit (depending on what platform provider is using)  6. Inform patient they will receive a phone call 15 minutes prior to their appointment time (may be from unknown caller ID) so they should be prepared to answer    TELEPHONE CALL NOTE  Jasmine Dawson has been deemed a candidate for a follow-up tele-health visit to limit community exposure during the Covid-19 pandemic. I spoke with the patient via phone to  ensure availability of phone/video source, confirm preferred email & phone number, and discuss instructions and expectations.  I reminded Jasmine Dawson to be prepared with any vital sign and/or heart rhythm information that could potentially be obtained via home monitoring, at the time of her visit. I reminded Jasmine Dawson to expect a phone call prior to her visit.  Berle Mull 08/23/2018 2:23 PM   INSTRUCTIONS FOR DOWNLOADING THE MYCHART APP TO SMARTPHONE  - The patient must first make sure to have activated MyChart and know their login  information - If Apple, go to [employer] and type in MyChart in the search bar and download the app. If Android, ask patient to go to [employer] and type in [place] in the search bar and download the app. The app is free but as with any other app downloads, their phone may require them to verify saved payment information or Apple/Android password.  - The patient will need to then log into the app with their MyChart username and password, and select [facility] as their healthcare provider to link the account. When it is time for your visit, go to the MyChart app, find appointments, and click Begin Video Visit. Be sure to Select Allow for your device to access the Microphone and Camera for your visit. You will then be connected, and your provider will be with you shortly.  **If they have any issues connecting, or need assistance please contact MyChart service desk [phone])**  **If using a computer, in order to ensure the best quality for their visit they will need to use either of the following Internet Browsers: [employer] or Google Chrome**  IF USING DOXIMITY or DOXY.ME - The patient will receive a link just prior to their visit by text.     FULL LENGTH CONSENT FOR TELE-HEALTH VISIT   I hereby voluntarily request, consent and authorize [facility] and its employed or contracted physicians, physician assistants, nurse practitioners or other licensed health care professionals (the Practitioner), to provide me with telemedicine health care services (the Services") as deemed necessary by the treating Practitioner. I acknowledge and consent to receive the Services by the Practitioner via telemedicine. I understand that the telemedicine visit will involve communicating with the Practitioner through live audiovisual communication technology and the disclosure of certain medical information by electronic transmission. I acknowledge that I have been given the opportunity to  request an in-person assessment or other available alternative prior to the telemedicine visit and am voluntarily participating in the telemedicine visit.  I understand that I have the right to withhold or withdraw my consent to the use of telemedicine in the course of my care at any time, without affecting my right to future care or treatment, and that the Practitioner or I may terminate the telemedicine visit at any time. I understand that I have the right to inspect all information obtained and/or recorded in the course of the telemedicine visit and may receive copies of available information for a reasonable fee.  I understand that some of the potential risks of receiving the Services via telemedicine include:   Delay or interruption in medical evaluation due to technological equipment failure or disruption;  Information transmitted may not be sufficient (e.g. poor resolution of images) to allow for appropriate medical decision making by the Practitioner; and/or   In rare instances, security protocols could fail, causing a breach of personal health information.  Furthermore, I acknowledge that it is my responsibility to provide  information about my medical history, conditions and care that is complete and accurate to the best of my ability. I acknowledge that Practitioner's advice, recommendations, and/or decision may be based on factors not within their control, such as incomplete or inaccurate data provided by me or distortions of diagnostic images or specimens that may result from electronic transmissions. I understand that the practice of medicine is not an exact science and that Practitioner makes no warranties or guarantees regarding treatment outcomes. I acknowledge that I will receive a copy of this consent concurrently upon execution via email to the email address I last provided but may also request a printed copy by calling the office of [facility].    I understand that my insurance  will be billed for this visit.   I have read or had this consent read to me.  I understand the contents of this consent, which adequately explains the benefits and risks of the Services being provided via telemedicine.   I have been provided ample opportunity to ask questions regarding this consent and the Services and have had my questions answered to my satisfaction.  I give my informed consent for the services to be provided through the use of telemedicine in my medical care  By participating in this telemedicine visit I agree to the above.

## 2018-08-24 ENCOUNTER — Telehealth: Payer: Self-pay | Admitting: *Deleted

## 2018-08-24 ENCOUNTER — Telehealth (INDEPENDENT_AMBULATORY_CARE_PROVIDER_SITE_OTHER): Payer: Medicare Other | Admitting: Physician Assistant

## 2018-08-24 ENCOUNTER — Encounter: Payer: Self-pay | Admitting: Physician Assistant

## 2018-08-24 ENCOUNTER — Other Ambulatory Visit: Payer: Self-pay

## 2018-08-24 VITALS — BP 141/73 | HR 55 | Ht 63.5 in | Wt 185.0 lb

## 2018-08-24 DIAGNOSIS — I471 Supraventricular tachycardia: Secondary | ICD-10-CM

## 2018-08-24 DIAGNOSIS — Z8673 Personal history of transient ischemic attack (TIA), and cerebral infarction without residual deficits: Secondary | ICD-10-CM

## 2018-08-24 DIAGNOSIS — I10 Essential (primary) hypertension: Secondary | ICD-10-CM

## 2018-08-24 DIAGNOSIS — E785 Hyperlipidemia, unspecified: Secondary | ICD-10-CM

## 2018-08-24 NOTE — Telephone Encounter (Signed)
Pt scheduled for virtual OV w/ [place] this Friday to discuss findings.

## 2018-08-24 NOTE — Telephone Encounter (Signed)
Virtual Visit Pre-Appointment Phone Call  "(Name), I am calling you today to discuss your upcoming appointment. We are currently trying to limit exposure to the virus that causes COVID-19 by seeing patients at home rather than in the office."  1. "What is the BEST phone number to call the day of the visit?" - include this in appointment notes  2. "Do you have or have access to (through a family member/friend) a smartphone with video capability that we can use for your visit?" a. If yes - list this number in appt notes as "cell" (if different from BEST phone #) and list the appointment type as a VIDEO visit in appointment notes b. If no - list the appointment type as a PHONE visit in appointment notes  3. Confirm consent - "In the setting of the current Covid19 crisis, you are scheduled for a (phone or video) visit with your provider on (date) at (time).  Just as we do with many in-office visits, in order for you to participate in this visit, we must obtain consent.  If you'd like, I can send this to your mychart (if signed up) or email for you to review.  Otherwise, I can obtain your verbal consent now.  All virtual visits are billed to your insurance company just like a normal visit would be.  By agreeing to a virtual visit, we'd like you to understand that the technology does not allow for your provider to perform an examination, and thus may limit your provider's ability to fully assess your condition. If your provider identifies any concerns that need to be evaluated in person, we will make arrangements to do so.  Finally, though the technology is pretty good, we cannot assure that it will always work on either your or our end, and in the setting of a video visit, we may have to convert it to a phone-only visit.  In either situation, we cannot ensure that we have a secure connection.  Are you willing to proceed?" STAFF: Did the patient verbally acknowledge consent to telehealth visit? Document  YES/NO here: YES  4. Advise patient to be prepared - "Two hours prior to your appointment, go ahead and check your blood pressure, pulse, oxygen saturation, and your weight (if you have the equipment to check those) and write them all down. When your visit starts, your provider will ask you for this information. If you have an Apple Watch or Kardia device, please plan to have heart rate information ready on the day of your appointment. Please have a pen and paper handy nearby the day of the visit as well."  5. Give patient instructions for MyChart download to smartphone OR Doximity/Doxy.me as below if video visit (depending on what platform provider is using)  6. Inform patient they will receive a phone call 15 minutes prior to their appointment time (may be from unknown caller ID) so they should be prepared to answer    TELEPHONE CALL NOTE  Jasmine Dawson has been deemed a candidate for a follow-up tele-health visit to limit community exposure during the Covid-19 pandemic. I spoke with the patient via phone to ensure availability of phone/video source, confirm preferred email & phone number, and discuss instructions and expectations.  I reminded Jasmine Dawson to be prepared with any vital sign and/or heart rhythm information that could potentially be obtained via home monitoring, at the time of her visit. I reminded Jasmine Dawson to expect a phone call prior to  her visit.  Baird Lyons, RN 08/24/2018 4:29 PM   INSTRUCTIONS FOR DOWNLOADING THE MYCHART APP TO SMARTPHONE  - The patient must first make sure to have activated MyChart and know their login information - If Apple, go to [employer] and type in MyChart in the search bar and download the app. If Android, ask patient to go to [employer] and type in [place] in the search bar and download the app. The app is free but as with any other app downloads, their phone may require them to verify saved payment information or  Apple/Android password.  - The patient will need to then log into the app with their MyChart username and password, and select [facility] as their healthcare provider to link the account. When it is time for your visit, go to the MyChart app, find appointments, and click Begin Video Visit. Be sure to Select Allow for your device to access the Microphone and Camera for your visit. You will then be connected, and your provider will be with you shortly.  **If they have any issues connecting, or need assistance please contact MyChart service desk [phone])**  **If using a computer, in order to ensure the best quality for their visit they will need to use either of the following Internet Browsers: [employer] or Google Chrome**  IF USING DOXIMITY or DOXY.ME - The patient will receive a link just prior to their visit by text.     FULL LENGTH CONSENT FOR TELE-HEALTH VISIT   I hereby voluntarily request, consent and authorize [facility] and its employed or contracted physicians, physician assistants, nurse practitioners or other licensed health care professionals (the Practitioner), to provide me with telemedicine health care services (the "Services") as deemed necessary by the treating Practitioner. I acknowledge and consent to receive the Services by the Practitioner via telemedicine. I understand that the telemedicine visit will involve communicating with the Practitioner through live audiovisual communication technology and the disclosure of certain medical information by electronic transmission. I acknowledge that I have been given the opportunity to request an in-person assessment or other available alternative prior to the telemedicine visit and am voluntarily participating in the telemedicine visit.  I understand that I have the right to withhold or withdraw my consent to the use of telemedicine in the course of my care at any time, without affecting my right to future care  or treatment, and that the Practitioner or I may terminate the telemedicine visit at any time. I understand that I have the right to inspect all information obtained and/or recorded in the course of the telemedicine visit and may receive copies of available information for a reasonable fee.  I understand that some of the potential risks of receiving the Services via telemedicine include:  [name] Delay or interruption in medical evaluation due to technological equipment failure or disruption; . Information transmitted may not be sufficient (e.g. poor resolution of images) to allow for appropriate medical decision making by the Practitioner; and/or  . In rare instances, security protocols could fail, causing a breach of personal health information.  Furthermore, I acknowledge that it is my responsibility to provide information about my medical history, conditions and care that is complete and accurate to the best of my ability. I acknowledge that Practitioner's advice, recommendations, and/or decision may be based on factors not within their control, such as incomplete or inaccurate data provided by me or distortions of diagnostic images or specimens that may result from electronic transmissions.  I understand that the practice of medicine is not an exact science and that Practitioner makes no warranties or guarantees regarding treatment outcomes. I acknowledge that I will receive a copy of this consent concurrently upon execution via email to the email address I last provided but may also request a printed copy by calling the office of [facility].    I understand that my insurance will be billed for this visit.   I have read or had this consent read to me. . I understand the contents of this consent, which adequately explains the benefits and risks of the Services being provided via telemedicine.  . I have been provided ample opportunity to ask questions regarding this consent and the Services and have had  my questions answered to my satisfaction. . I give my informed consent for the services to be provided through the use of telemedicine in my medical care  By participating in this telemedicine visit I agree to the above.

## 2018-08-24 NOTE — Patient Instructions (Signed)
Medication Instructions:  Your physician recommends that you continue on your current medications as directed. Please refer to the Current Medication list given to you today.  If you need a refill on your cardiac medications before your next appointment, please call your pharmacy.   Lab work: None ordered  If you have labs (blood work) drawn today and your tests are completely normal, you will receive your results only by: [name] MyChart Message (if you have MyChart) OR . A paper copy in the mail If you have any lab test that is abnormal or we need to change your treatment, we will call you to review the results.  Testing/Procedures: None ordered   Follow-Up: At [facility], you and your health needs are our priority.  As part of our continuing mission to provide you with exceptional heart care, we have created designated Provider Care Teams.  These Care Teams include your primary Cardiologist (physician) and Advanced Practice Providers (APPs -  Physician Assistants and Nurse Practitioners) who all work together to provide you with the care you need, when you need it. You will need a follow up appointment in 12 months.  Please call our office 2 months in advance to schedule this appointment.  You may see Lance Muss, MD or one of the following Advanced Practice Providers on your designated Care Team:   [place], PA-C Ronie Spies, PA-C . Jacolyn Reedy, PA-C  Any Other Special Instructions Will Be Listed Below (If Applicable). Check BP regularly in the middle of the day and call us after a few days of readings to review  Continue to work on weight loss - congratulations  F/u with PCP for recheck of your cholesterol and follow-up plans going forward  Keep f/u with neurology in June

## 2018-08-24 NOTE — Telephone Encounter (Signed)
Per records, patient missed wound check appointment on 08/09/18. Pt reports she was not aware of the appointment.  Discussed incision--Steri-strips came off, small scab remains, no drainage, redness, swelling, fever, or chills. She feels it is healing well. Advised to call for any signs/symptoms of infection.  Discussed Carelink monitor. Patient has kept it in the box as she was unsure what to do with it. Assisted patient with setting up monitor and had her send a manual transmission for review. Discussed remote monitoring and purpose of LINQ to look for AF as a potential cause of her stroke.  Transmission received. No symptom, tachy, pause, or brady episodes. 1 "AF" episode on 08/20/18 (0% burden)--ECG appears true AF. Advised patient I will send this information to [name] and Roanna Raider, RN, to decide on plan and update her. Pt is willing to do a virtual visit with [name] (via telephone) if necessary.  Pt verbalizes understanding of all instructions and thanked me for my call.

## 2018-08-24 NOTE — Telephone Encounter (Signed)
-----   Message from Elliot Cousin, [place] sent at 08/24/2018 11:29 AM EDT ----- Regarding: FW: Orders 4/28 See last part of [facility]'s staff message re: loop recorder ----- Message ----- From: Laurann [place], PA-C Sent: 08/24/2018  11:25 AM EDT To: Elliot Cousin, RMA Subject: Orders 4/28                                    Remove clonidine from list - she never started and should not start Keep atenolol at 50mg  daily. She was asked to cut in half in the hospital but did not do so and HR is fine Check BP regularly in the middle of the day and call us after a few days of readings to review Continue to work on weight loss - congratulations F/u with PCP for recheck of your cholesterol and follow-up plans going forward Keep f/u with neurology in June  Patient is confused about how to manage the box for her loop recorder so needs device clinic to call her. She also never had her wound check so needs their input on what to do with that - please let them know. Per her report site looks good. Only one scab but no pus and healing well  Otherwise f/u [place] in 1 year

## 2018-08-26 ENCOUNTER — Other Ambulatory Visit: Payer: Self-pay

## 2018-08-26 ENCOUNTER — Encounter (HOSPITAL_COMMUNITY): Payer: Self-pay

## 2018-08-26 ENCOUNTER — Emergency Department (HOSPITAL_COMMUNITY)
Admission: EM | Admit: 2018-08-26 | Discharge: 2018-08-26 | Disposition: A | Discharge status: Home / Self Care | Payer: Medicare Other | Attending: Emergency Medicine | Admitting: Emergency Medicine

## 2018-08-26 ENCOUNTER — Emergency Department (HOSPITAL_COMMUNITY): Payer: Medicare Other

## 2018-08-26 DIAGNOSIS — I10 Essential (primary) hypertension: Secondary | ICD-10-CM

## 2018-08-26 DIAGNOSIS — K8020 Calculus of gallbladder without cholecystitis without obstruction: Secondary | ICD-10-CM | POA: Diagnosis not present

## 2018-08-26 DIAGNOSIS — Z79899 Other long term (current) drug therapy: Secondary | ICD-10-CM | POA: Diagnosis not present

## 2018-08-26 DIAGNOSIS — N183 Chronic kidney disease, stage 3 (moderate): Secondary | ICD-10-CM | POA: Diagnosis not present

## 2018-08-26 DIAGNOSIS — Z7902 Long term (current) use of antithrombotics/antiplatelets: Secondary | ICD-10-CM | POA: Diagnosis not present

## 2018-08-26 DIAGNOSIS — K8050 Calculus of bile duct without cholangitis or cholecystitis without obstruction: Secondary | ICD-10-CM

## 2018-08-26 DIAGNOSIS — I129 Hypertensive chronic kidney disease with stage 1 through stage 4 chronic kidney disease, or unspecified chronic kidney disease: Secondary | ICD-10-CM | POA: Insufficient documentation

## 2018-08-26 DIAGNOSIS — Z95818 Presence of other cardiac implants and grafts: Secondary | ICD-10-CM | POA: Insufficient documentation

## 2018-08-26 DIAGNOSIS — Z8673 Personal history of transient ischemic attack (TIA), and cerebral infarction without residual deficits: Secondary | ICD-10-CM | POA: Insufficient documentation

## 2018-08-26 DIAGNOSIS — R109 Unspecified abdominal pain: Secondary | ICD-10-CM | POA: Diagnosis present

## 2018-08-26 LAB — URINALYSIS, ROUTINE W REFLEX MICROSCOPIC
Bilirubin Urine: NEGATIVE
Glucose, UA: NEGATIVE mg/dL
Hgb urine dipstick: NEGATIVE
Ketones, ur: NEGATIVE mg/dL
Leukocytes,Ua: NEGATIVE
Nitrite: NEGATIVE
Protein, ur: NEGATIVE mg/dL
Specific Gravity, Urine: 1.006 (ref 1.005–1.030)
pH: 7 (ref 5.0–8.0)

## 2018-08-26 LAB — CBC WITH DIFFERENTIAL/PLATELET
Abs Immature Granulocytes: 0.03 10*3/uL (ref 0.00–0.07)
Basophils Absolute: 0 10*3/uL (ref 0.0–0.1)
Basophils Relative: 1 %
Eosinophils Absolute: 0.1 10*3/uL (ref 0.0–0.5)
Eosinophils Relative: 2 %
HCT: 39.2 % (ref 36.0–46.0)
Hemoglobin: 12.7 g/dL (ref 12.0–15.0)
Immature Granulocytes: 1 %
Lymphocytes Relative: 27 %
Lymphs Abs: 1.5 10*3/uL (ref 0.7–4.0)
MCH: 28.9 pg (ref 26.0–34.0)
MCHC: 32.4 g/dL (ref 30.0–36.0)
MCV: 89.3 fL (ref 80.0–100.0)
Monocytes Absolute: 0.3 10*3/uL (ref 0.1–1.0)
Monocytes Relative: 5 %
Neutro Abs: 3.7 10*3/uL (ref 1.7–7.7)
Neutrophils Relative %: 64 %
Platelets: 252 10*3/uL (ref 150–400)
RBC: 4.39 MIL/uL (ref 3.87–5.11)
RDW: 13.1 % (ref 11.5–15.5)
WBC: 5.7 10*3/uL (ref 4.0–10.5)
nRBC: 0 % (ref 0.0–0.2)

## 2018-08-26 LAB — COMPREHENSIVE METABOLIC PANEL
ALT: 15 U/L (ref 0–44)
AST: 32 U/L (ref 15–41)
Albumin: 3.7 g/dL (ref 3.5–5.0)
Alkaline Phosphatase: 83 U/L (ref 38–126)
Anion gap: 10 (ref 5–15)
BUN: 21 mg/dL (ref 8–23)
CO2: 23 mmol/L (ref 22–32)
Calcium: 9.1 mg/dL (ref 8.9–10.3)
Chloride: 105 mmol/L (ref 98–111)
Creatinine, Ser: 1.49 mg/dL — ABNORMAL HIGH (ref 0.44–1.00)
GFR calc Af Amer: 39 mL/min — ABNORMAL LOW (ref >60)
GFR calc non Af Amer: 34 mL/min — ABNORMAL LOW (ref >60)
Glucose, Bld: 150 mg/dL — ABNORMAL HIGH (ref 70–99)
Potassium: 3.7 mmol/L (ref 3.5–5.1)
Sodium: 138 mmol/L (ref 135–145)
Total Bilirubin: 0.7 mg/dL (ref 0.3–1.2)
Total Protein: 6.9 g/dL (ref 6.5–8.1)

## 2018-08-26 MED ORDER — SODIUM CHLORIDE 0.9 % IV BOLUS
500.0000 mL | Freq: Once | INTRAVENOUS | Status: AC
  Administered 2018-08-26: 500 mL via INTRAVENOUS

## 2018-08-26 MED ORDER — ONDANSETRON HCL 4 MG PO TABS: 4.0000 mg | ORAL_TABLET | Freq: Three times a day (TID) | ORAL | 0 refills | Status: DC | PRN

## 2018-08-26 MED ORDER — ONDANSETRON HCL 4 MG/2ML IJ SOLN
4.0000 mg | Freq: Once | INTRAMUSCULAR | Status: AC
  Administered 2018-08-26: 11:00:00 4 mg via INTRAVENOUS
  Filled 2018-08-26: qty 2

## 2018-08-26 MED ORDER — ATENOLOL 50 MG PO TABS
50.0000 mg | ORAL_TABLET | Freq: Once | ORAL | Status: AC
  Administered 2018-08-26: 12:00:00 50 mg via ORAL
  Filled 2018-08-26: qty 1

## 2018-08-26 MED ORDER — ATENOLOL 50 MG PO TABS: 50.0000 mg | ORAL_TABLET | Freq: Every day | ORAL | Status: DC

## 2018-08-26 NOTE — ED Provider Notes (Signed)
[facility] EMERGENCY DEPARTMENT Provider Note   CSN: [id number] Arrival date & time: 08/26/18  6967    History   Chief Complaint No chief complaint on file.   HPI Jasmine Dawson is a 73 y.o. female.     Patient with chronic kidney disease, high blood pressure compliant with atenolol last took yesterday, pulmonary hypertension, stroke presents with abdominal discomfort and cramping along with elevated blood pressure.  Patient had elevated blood pressure 190 at home, mild nausea without vomiting.  No chest pain or shortness of breath.  No fevers or chills.  Patient has mild left-sided abdominal discomfort different than normal.  Patient's last bowel movement 2:00 this morning no blood.  No history of diverticulitis.     Past Medical History:  Diagnosis Date   CKD (chronic kidney disease), stage III (HCC)    Heterozygous factor V Leiden mutation (HCC)    Hyperlipidemia    Hypertension    Motion sickness    Prothrombin gene mutation (HCC)    Pulmonary hypertension (HCC)    a. 2D echo 11/01/17 showed EF 65-70%, grade 1 DD, mod-severe TR, moderately increased PASP .   PVC's (premature ventricular contractions)    occasional PVCs   Sinus bradycardia    a. requiring reduction in beta blocker in 2020.   Stroke (cerebrum) [facility])    SVT (supraventricular tachycardia) (HCC)    Tricuspid regurgitation     Patient Active Problem List   Diagnosis Date Noted   Hypertensive urgency    Right arm weakness    Cerebral embolism with cerebral infarction 07/29/2018   Weakness 07/28/2018   Chest pain 10/31/2017   Palpitations 10/31/2017   Right leg pain 10/31/2017   Elevated troponin 10/31/2017   SVT (supraventricular tachycardia) (HCC) 10/03/2016   CRI (chronic renal insufficiency), stage 3 (moderate) (HCC) 10/03/2016   Endometrial polyp 11/07/2011   Dizzy spells 09/11/2011   HTN (hypertension) 09/11/2011   Abdominal pain  09/11/2011   Cholelithiasis 09/11/2011    Past Surgical History:  Procedure Laterality Date   HYSTEROSCOPY W/D&C  11/07/2011   Procedure: DILATATION AND CURETTAGE /HYSTEROSCOPY;  Surgeon: Antionette Char, MD;  Location: [facility] ORS;  Service: Gynecology;  Laterality: N/A;  endometrial polypectomy   LOOP RECORDER INSERTION N/A 07/30/2018   Procedure: LOOP RECORDER INSERTION;  Surgeon: Regan Lemming, MD;  Location: [facility] INVASIVE CV LAB;  Service: Cardiovascular;  Laterality: N/A;   TUBAL LIGATION  1977     OB History   No obstetric history on file.      Home Medications    Prior to Admission medications   Medication Sig Start Date End Date Taking? Authorizing Provider  atenolol (TENORMIN) 50 MG tablet Take 50 mg by mouth daily.    [provider]  clopidogrel (PLAVIX) 75 MG tablet Take 1 tablet (75 mg total) by mouth daily. 07/31/18   Yvette Rack, MD  famotidine (PEPCID) 20 MG tablet Take 1 tablet (20 mg total) by mouth 2 (two) times daily. 05/19/15   Garlon Hatchet, PA-C  ondansetron (ZOFRAN) 4 MG tablet Take 1 tablet (4 mg total) by mouth every 8 (eight) hours as needed for nausea or vomiting. 08/26/18   Arthor Captain, PA-C  rosuvastatin (CRESTOR) 40 MG tablet Take 1 tablet (40 mg total) by mouth daily at 6 PM. 07/31/18   Yvette Rack, MD    Family History Family History  Problem Relation Age of Onset   Diabetes Mother  died at 77   Heart attack Mother    Heart attack Father        died at 74    Social History Social History   Tobacco Use   Smoking status: Never Smoker   Smokeless tobacco: Never Used  Substance Use Topics   Alcohol use: No   Drug use: No     Allergies   Eggs or egg-derived products; Orange fruit [citrus]; Prednisone; Sulfa antibiotics; Tomato; Influenza vaccines; Aspartame and phenylalanine; Caffeine; and Shellfish allergy   Review of Systems Review of Systems  Constitutional: Positive for appetite change. Negative  for chills and fever.  HENT: Negative for congestion.   Eyes: Negative for visual disturbance.  Respiratory: Negative for shortness of breath.   Cardiovascular: Negative for chest pain.  Gastrointestinal: Positive for abdominal pain and nausea. Negative for vomiting.  Genitourinary: Negative for dysuria and flank pain.  Musculoskeletal: Negative for back pain, neck pain and neck stiffness.  Skin: Negative for rash.  Neurological: Negative for light-headedness and headaches.     Physical Exam Updated Vital Signs BP (!) 178/68 (BP Location: Left Arm)    Pulse (!) 54    Temp 98.4 F (36.9 C) (Oral)    Resp 16    Ht  (1.6 m)    Wt 83.9 kg    SpO2 98%    BMI 32.77 kg/m   Physical Exam Vitals signs and nursing note reviewed.  Constitutional:      Appearance: She is well-developed.  HENT:     Head: Normocephalic and atraumatic.  Eyes:     General:        Right eye: No discharge.        Left eye: No discharge.     Conjunctiva/sclera: Conjunctivae normal.  Neck:     Musculoskeletal: Normal range of motion and neck supple.     Trachea: No tracheal deviation.  Cardiovascular:     Rate and Rhythm: Normal rate and regular rhythm.  Pulmonary:     Effort: Pulmonary effort is normal.     Breath sounds: Normal breath sounds.  Abdominal:     General: There is no distension.     Palpations: Abdomen is soft.     Tenderness: There is abdominal tenderness (mild left sided). There is no guarding.  Skin:    General: Skin is warm.     Findings: No rash.  Neurological:     Mental Status: She is alert and oriented to person, place, and time.      ED Treatments / Results  Labs (all labs ordered are listed, but only abnormal results are displayed) Labs Reviewed  COMPREHENSIVE METABOLIC PANEL - Abnormal; Notable for the following components:      Result Value   Glucose, Bld 150 (*)    Creatinine, Ser 1.49 (*)    GFR calc non Af Amer 34 (*)    GFR calc Af Amer 39 (*)    All other  components within normal limits  URINALYSIS, ROUTINE W REFLEX MICROSCOPIC - Abnormal; Notable for the following components:   APPearance CLOUDY (*)    All other components within normal limits  CBC WITH DIFFERENTIAL/PLATELET    EKG EKG Interpretation  Date/Time:  Thursday August 26 2018 09:41:20 EDT Ventricular Rate:  60 PR Interval:    QRS Duration: 103 QT Interval:  438 QTC Calculation: 438 R Axis:   31 Text Interpretation:  Sinus rhythm Confirmed by Blane Ohara [phone]) on 08/26/2018 1:01:16 PM   Radiology  Ct Abdomen Pelvis Wo Contrast  Result Date: 08/26/2018 CLINICAL DATA:  Abdominal pain and distension EXAM: CT ABDOMEN AND PELVIS WITHOUT CONTRAST TECHNIQUE: Multidetector CT imaging of the abdomen and pelvis was performed following the standard protocol without oral or IV contrast. COMPARISON:  Sep 24, 2016 FINDINGS: Lower chest: There is bibasilar atelectatic change. There are scattered foci of coronary artery calcification. Hepatobiliary: There is a cyst at the level of the fissure for the ligamentum teres in the anterior segment right lobe of the liver measuring 2.4 x 2.0 cm. No other focal liver lesion appreciable on this noncontrast enhanced study. There are multiple gallstones within the gallbladder. The gallbladder wall does not appear appreciably thickened. There is no appreciable biliary duct dilatation. Pancreas: There is no evident pancreatic mass or inflammatory focus. Spleen: No splenic lesions are evident. Adrenals/Urinary Tract: Adrenals bilaterally appear unremarkable. Kidneys bilaterally show no evident mass or hydronephrosis on either side. There is no appreciable renal or ureteral calculus on either side. Urinary bladder is midline with wall thickness within normal limits. Stomach/Bowel: There is no appreciable bowel wall or mesenteric thickening. No appreciable diverticular disease. There is no appreciable bowel obstruction. There is no demonstrable free air or portal  venous air. Vascular/Lymphatic: No aneurysm evident. There is aortic and iliac artery atherosclerotic calcification. There is no evident adenopathy in the abdomen or pelvis. Reproductive: Uterus is anteverted. Small calcification in the inferior uterus measuring 6 mm may represent a subcentimeter leiomyoma. No extrauterine pelvic mass evident. Other: The appendix appears unremarkable. There is no abscess or ascites in the abdomen or pelvis. Musculoskeletal: There is degenerative change in the lumbar spine. There are no blastic or lytic bone lesions. There is no intramuscular or abdominal wall lesion. IMPRESSION: 1. Cholelithiasis. Gallbladder wall does not appear appreciably thickened by CT. 2. No evident bowel obstruction. No appreciable diverticular disease. No evident abscess in the abdomen or pelvis. Appendix appears normal. 3. No evident renal or ureteral calculus. No hydronephrosis. Urinary bladder wall thickness normal. 4. Aortic and iliac artery atherosclerosis. Foci of coronary artery calcification. 5. Small calcification in the uterus which may represent a subcentimeter leiomyoma. Electronically Signed   By: Bretta BangWilliam  Woodruff III M.D.   On: 08/26/2018 11:35    Procedures Procedures (including critical care time)  Medications Ordered in ED Medications  ondansetron (ZOFRAN) injection 4 mg (4 mg Intravenous Given 08/26/18 1048)  sodium chloride 0.9 % bolus 500 mL (0 mLs Intravenous Stopped 08/26/18 1255)  atenolol (TENORMIN) tablet 50 mg (50 mg Oral Given 08/26/18 1144)     Initial Impression / Assessment and Plan / ED Course  I have reviewed the triage vital signs and the nursing notes.  Pertinent labs & imaging results that were available during my care of the patient were reviewed by me and considered in my medical decision making (see chart for details).  Clinical Course as of Aug 26 1350  Thu Aug 26, 2018  1124 Glucose(!): 150 [AH]  1124 Creatinine(!): 1.49 [AH]  1125 Blood sugar  elevated. Creatinine at baseline. No leukocytosis.   [AH]  1209 Blood pressure has improved after home meds.  BP(!): 165/84 [AH]  1311 + gallstones without inflammation  CT ABDOMEN PELVIS WO CONTRAST [AH]    Clinical Course User Index [AH] Arthor CaptainHarris, Abigail, PA-C      Patient presents with elevated blood pressure, nausea and abdominal discomfort.  Plan for pain control as needed, nausea meds, CT scan to look for signs of bowel obstruction or diverticulitis.  Will  monitor blood pressure.  Physician assistant finished care of patient.  CT scan showed gallstones, no fever, no right upper quadrant tenderness.  Blood work reviewed overall reassuring, creatinine 1.49.  Plan for close outpatient follow-up. Final Clinical Impressions(s) / ED Diagnoses   Final diagnoses:  Hypertension, unspecified type  Calculus of gallbladder without cholecystitis without obstruction  Biliary colic symptom    ED Discharge Orders         Ordered    ondansetron (ZOFRAN) 4 MG tablet  Every 8 hours PRN     08/26/18 1317           Blane Ohara, MD 08/26/18 1352

## 2018-08-26 NOTE — ED Notes (Signed)
Pt reminded of the need of urine.  

## 2018-08-26 NOTE — Discharge Instructions (Signed)

## 2018-08-26 NOTE — ED Triage Notes (Signed)
Pt arrives with nausea since 0100, and hypertension. no vomitting, diahrrea, known fever, SOB, or cough

## 2018-08-26 NOTE — ED Notes (Signed)
Patient verbalizes understanding of discharge instructions. Opportunity for questioning and answers were provided. Armband removed by staff, pt discharged from ED.  

## 2018-08-26 NOTE — ED Provider Notes (Signed)
1:19 PM BP (!) 165/84   Pulse (!) 55   Temp 98.4 F (36.9 C) (Oral)   Resp 13   Ht 5\' 3"  (1.6 m)   Wt 83.9 kg   SpO2 100%   BMI 32.77 kg/m    73 y/o F seen initially by Dr. Jodi Mourning, clinical decision pending. Here fdor c/o htn, mild nausea, and mid left upper abdominal discomfort. Will give home dose of atenolol. CT abdomen and pelvis pending. Clinical Course as of Aug 25 1317  Thu Aug 26, 2018  1124 Glucose(!): 150 [AH]  1124 Creatinine(!): 1.49 [AH]  1125 Blood sugar elevated. Creatinine at baseline. No leukocytosis.   [AH]  1209 Blood pressure has improved after home meds.  BP(!): 165/84 [AH]  1311 + gallstones without inflammation  CT ABDOMEN PELVIS WO CONTRAST [AH]    Clinical Course User Index [AH] Arthor Captain, PA-C     CC:HTN, abdominal pain VS: BP (!) 165/84   Pulse (!) 55   Temp 98.4 F (36.9 C) (Oral)   Resp 13   Ht 5\' 3"  (1.6 m)   Wt 83.9 kg   SpO2 100%   BMI 32.77 kg/m   DDX:The causes for generalized abdominal pain include but are not limited to gastritis, gastroenteritis, IBS, pancreatitis, peritonitis, intestinal ischemia, constipation, UTI, intestinal obstruction, perforated viscus, eg, peptic ulcer, appendix, gallbladder, diverticulitis, physical or sexual abuse, abdominal abscess, ruptured ectopic pregnancy, ruptured spleen, AAA, diabetic ketoacidosis, hypercalcemia, uremia, parasitic or other infection, eg: tapeworms, flukes, Giargia, Cryto, Yersinia, adrenal insufficiency,lead poisoning, iron toxicity, polyarteritis nodosa, Henoch-Schnlein purpura, porphyria, eg, acute intermittent porphyria, familial Mediterranean fever. Labs: I reviewed the labs which show mild renal insufficiency and hyperglycemia Imaging: I personally reviewed the images (CT abd and pelvis) which show(s) gall stones with out cholecystitis EKG:  EKG Interpretation  Date/Time:  Thursday August 26 2018 09:41:20 EDT Ventricular Rate:  60 PR Interval:    QRS Duration: 103 QT  Interval:  438 QTC Calculation: 438 R Axis:   31 Text Interpretation:  Sinus rhythm Confirmed by Blane Ohara [phone]) on 08/26/2018 1:01:16 PM       VCB:SWHQPRF with hypertension, nausea and abdominal pain radiating across the diaphragmatic region. Suspect gastritis vs symptomatic gallstones. I have reviewed the labs and the images with the patient. No current pain  And no pain to palpation of the abdomen at this time.  Patient disposition:Discharge- OP f/u with [facility] for gallbladder and PCP for HTN. Discussed dietary modifications. Patient condition: Good. The patient appears reasonably screened and/or stabilized for discharge and I doubt any other medical condition or other [facility] requiring further screening, evaluation, or treatment in the ED at this time prior to discharge. I have discussed lab and/or imaging findings with the patient and answered all questions/concerns to the best of my ability. I have discussed return precautions and OP follow up.        Arthor Captain, PA-C 08/26/18 1322    Blane Ohara, MD 08/28/18 1331

## 2018-08-27 ENCOUNTER — Encounter: Payer: Medicare Other | Admitting: Cardiology

## 2018-08-27 NOTE — Progress Notes (Signed)
This encounter was created in error - please disregard.

## 2018-08-30 ENCOUNTER — Telehealth (INDEPENDENT_AMBULATORY_CARE_PROVIDER_SITE_OTHER): Payer: Medicare Other | Admitting: Cardiology

## 2018-08-30 ENCOUNTER — Telehealth: Payer: Self-pay | Admitting: Cardiology

## 2018-08-30 ENCOUNTER — Encounter: Payer: Self-pay | Admitting: Cardiology

## 2018-08-30 ENCOUNTER — Other Ambulatory Visit: Payer: Self-pay

## 2018-08-30 DIAGNOSIS — I480 Paroxysmal atrial fibrillation: Secondary | ICD-10-CM | POA: Diagnosis not present

## 2018-08-30 MED ORDER — APIXABAN 5 MG PO TABS: 5.0000 mg | ORAL_TABLET | Freq: Two times a day (BID) | ORAL | 6 refills | Status: DC

## 2018-08-30 NOTE — Progress Notes (Signed)
Electrophysiology TeleHealth Note   Due to national recommendations of social distancing due to COVID 19, an audio/video telehealth visit is felt to be most appropriate for this patient at this time.  See Epic message for the patient's consent to telehealth for [facility].   Date:  08/30/2018   ID:  Jasmine Dawson, DOB [birth year], MRN [id number]  Location: patient's home  Provider location: [address], [place]  Evaluation Performed: Follow-up visit  PCP:  Leilani Ableeese, Betti, MD  Cardiologist:  Lance MussJayadeep Varanasi, MD  Electrophysiologist:  Dr Elberta Fortisamnitz  Chief Complaint:  atrial fibrillaiton  History of Present Illness:    Jasmine Dawson is a 73 y.o. female who presents via audio/video conferencing for a telehealth visit today.  Since last being seen in our clinic, the patient reports doing very well.  Today, she denies symptoms of palpitations, chest pain, shortness of breath,  lower extremity edema, dizziness, presyncope, or syncope.  The patient is otherwise without complaint today.  The patient denies symptoms of fevers, chills, cough, or new SOB worrisome for COVID 19.  She has a history of CVA.  She had a Linq monitor implanted which showed episodes of atrial fibrillation.  Today, denies symptoms of palpitations, chest pain, shortness of breath, orthopnea, PND, lower extremity edema, claudication, dizziness, presyncope, syncope, bleeding, or neurologic sequela. The patient is tolerating medications without difficulties.  Currently she feels well.  She has no chest pain or shortness of breath.  She is able to do all of her daily activities.  Past Medical History:  Diagnosis Date  . CKD (chronic kidney disease), stage III (HCC)   . Heterozygous factor V Leiden mutation (HCC)   . Hyperlipidemia   . Hypertension   . Motion sickness   . Prothrombin gene mutation (HCC)   . Pulmonary hypertension (HCC)    a. 2D echo 11/01/17 showed EF 65-70%, grade 1 DD,  mod-severe TR, moderately increased PASP 47mmHg.  [name]. PVC's (premature ventricular contractions)    occasional PVCs  . Sinus bradycardia    a. requiring reduction in beta blocker in 2020.  [name]. Stroke (cerebrum) (HCC)   . SVT (supraventricular tachycardia) (HCC)   . Tricuspid regurgitation     Past Surgical History:  Procedure Laterality Date  . HYSTEROSCOPY W/D&C  11/07/2011   Procedure: DILATATION AND CURETTAGE /HYSTEROSCOPY;  Surgeon: Antionette CharLisa Jackson-Moore, MD;  Location: [facility];  Service: Gynecology;  Laterality: N/A;  endometrial polypectomy  . LOOP RECORDER INSERTION N/A 07/30/2018   Procedure: LOOP RECORDER INSERTION;  Surgeon: Regan Lemmingamnitz, Kimbery Harwood Martin, MD;  Location: [facility] INVASIVE [facility];  Service: Cardiovascular;  Laterality: N/A;  . TUBAL LIGATION  1977    Current Outpatient Medications  Medication Sig Dispense Refill  . atenolol (TENORMIN) 50 MG tablet Take 50 mg by mouth daily.    . clopidogrel (PLAVIX) 75 MG tablet Take 1 tablet (75 mg total) by mouth daily. 30 tablet 3  . famotidine (PEPCID) 20 MG tablet Take 1 tablet (20 mg total) by mouth 2 (two) times daily. 30 tablet 0  . ondansetron (ZOFRAN) 4 MG tablet Take 1 tablet (4 mg total) by mouth every 8 (eight) hours as needed for nausea or vomiting. 10 tablet 0  . rosuvastatin (CRESTOR) 40 MG tablet Take 1 tablet (40 mg total) by mouth daily at 6 PM. 30 tablet 3   No current facility-administered medications for this visit.     Allergies:   Eggs or egg-derived products; Orange fruit [citrus]; Prednisone;  Sulfa antibiotics; Tomato; Influenza vaccines; Aspartame and phenylalanine; Caffeine; and Shellfish allergy   Social History:  The patient  reports that she has never smoked. She has never used smokeless tobacco. She reports that she does not drink alcohol or use drugs.   Family History:  The patient's  family history includes Diabetes in her mother; Heart attack in her father and mother.   ROS:  Please see the history of present  illness.   All other systems are personally reviewed and negative.    Exam:    Vital Signs:  BP (!) 147/77   Pulse 60   Over the phone, no acute distress, no shortness of breath.  Labs/Other Tests and Data Reviewed:    Recent Labs: 03/18/2018: TSH 2.000 08/26/2018: ALT 15; BUN 21; Creatinine, Ser 1.49; Hemoglobin 12.7; Platelets 252; Potassium 3.7; Sodium 138   Wt Readings from Last 3 Encounters:  08/26/18 185 lb (83.9 kg)  08/24/18 185 lb (83.9 kg)  07/28/18 185 lb 10 oz (84.2 kg)     Other studies personally reviewed: Additional studies/ records that were reviewed today include: ECG 08/26/2018 personally reviewed Review of the above records today demonstrates: Sinus rhythm   Last device remote is reviewed from [employer] PDF dated 08/24/2018 which reveals normal device function, atrial fibrillation  ASSESSMENT & PLAN:    1.  Paroxysmal atrial fibrillation: Patient is minimally symptomatic.  She was unaware of her most recent episode.  She has had a CVA and thus we Jibri Schriefer plan to start Eliquis.  She is unsure of if she wants to start this medication or not.  She Cayenne Breault discuss this with her primary physician.  I told her that we Jet Armbrust go ahead and call in the Eliquis, and likely stop her Plavix.  This patients CHA2DS2-VASc Score and unadjusted Ischemic Stroke Rate (% per year) is equal to 9.7 % stroke rate/year from a score of 6  Above score calculated as 1 point each if present [CHF, HTN, DM, Vascular=MI/PAD/Aortic Plaque, Age if 65-74, or Female] Above score calculated as 2 points each if present [Age > 75, or Stroke/TIA/TE]    2.  Hypertension: Elevated over the weekend.  She Magon Croson check her blood pressure today.  She does have follow-up today with her primary physician and Davionne Mastrangelo discuss this with him.  COVID 19 screen The patient denies symptoms of COVID 19 at this time.  The importance of social distancing was discussed today.  Follow-up: 6 months with A. fib clinic  Current  medicines are reviewed at length with the patient today.   The patient does not have concerns regarding her medicines.  The following changes were made today: Stop Plavix, start Eliquis  Labs/ tests ordered today include:  No orders of the defined types were placed in this encounter.    Patient Risk:  after full review of this patients clinical status, I feel that they are at moderate risk at this time.  Today, I have spent 15 minutes with the patient with telehealth technology discussing atrial fibrillation.    Signed, Amjad Fikes Jorja Loa, MD  08/30/2018 11:38 AM     [facility] [address] [place] [address] [phone] (office) [phone] (fax)

## 2018-08-30 NOTE — Telephone Encounter (Signed)
Pt instructed to stop Plavix, start Eliquis 5mg  BID. Eliquis instructions reviewed w/ pt.  Pt understands when to call the office while taking this medication. Patient verbalized understanding and agreeable to plan.

## 2018-08-30 NOTE — Telephone Encounter (Signed)
Follow Up:    Pt wants Dr Elberta Fortis to know that she have decided to take the Eliquis and he can call lit in.

## 2018-09-01 ENCOUNTER — Other Ambulatory Visit: Payer: Self-pay

## 2018-09-01 ENCOUNTER — Ambulatory Visit (INDEPENDENT_AMBULATORY_CARE_PROVIDER_SITE_OTHER): Payer: Medicare Other | Admitting: *Deleted

## 2018-09-01 DIAGNOSIS — I63412 Cerebral infarction due to embolism of left middle cerebral artery: Secondary | ICD-10-CM | POA: Diagnosis not present

## 2018-09-01 LAB — CUP PACEART REMOTE DEVICE CHECK
Date Time Interrogation Session: 20200506153027
Implantable Pulse Generator Implant Date: 20200403

## 2018-09-07 NOTE — Progress Notes (Signed)
Carelink Summary Report / Loop Recorder 

## 2018-10-04 ENCOUNTER — Ambulatory Visit (INDEPENDENT_AMBULATORY_CARE_PROVIDER_SITE_OTHER): Payer: Medicare Other | Admitting: *Deleted

## 2018-10-04 DIAGNOSIS — I63412 Cerebral infarction due to embolism of left middle cerebral artery: Secondary | ICD-10-CM | POA: Diagnosis not present

## 2018-10-04 DIAGNOSIS — I480 Paroxysmal atrial fibrillation: Secondary | ICD-10-CM

## 2018-10-04 LAB — CUP PACEART REMOTE DEVICE CHECK
Date Time Interrogation Session: 20200608173911
Implantable Pulse Generator Implant Date: 20200403

## 2018-10-07 ENCOUNTER — Inpatient Hospital Stay: Payer: Medicare Other | Admitting: Adult Health

## 2018-10-12 NOTE — Progress Notes (Signed)
Carelink Summary Report / Loop Recorder 

## 2018-11-07 ENCOUNTER — Other Ambulatory Visit: Payer: Self-pay

## 2018-11-07 ENCOUNTER — Emergency Department (HOSPITAL_COMMUNITY): Payer: Medicare Other

## 2018-11-07 ENCOUNTER — Emergency Department (HOSPITAL_COMMUNITY)
Admission: EM | Admit: 2018-11-07 | Discharge: 2018-11-07 | Disposition: A | Discharge status: Home / Self Care | Payer: Medicare Other | Attending: Emergency Medicine | Admitting: Emergency Medicine

## 2018-11-07 DIAGNOSIS — I129 Hypertensive chronic kidney disease with stage 1 through stage 4 chronic kidney disease, or unspecified chronic kidney disease: Secondary | ICD-10-CM | POA: Diagnosis not present

## 2018-11-07 DIAGNOSIS — N189 Chronic kidney disease, unspecified: Secondary | ICD-10-CM | POA: Diagnosis not present

## 2018-11-07 DIAGNOSIS — Z7901 Long term (current) use of anticoagulants: Secondary | ICD-10-CM | POA: Insufficient documentation

## 2018-11-07 DIAGNOSIS — R42 Dizziness and giddiness: Secondary | ICD-10-CM | POA: Diagnosis present

## 2018-11-07 DIAGNOSIS — Z8673 Personal history of transient ischemic attack (TIA), and cerebral infarction without residual deficits: Secondary | ICD-10-CM | POA: Diagnosis not present

## 2018-11-07 DIAGNOSIS — H81399 Other peripheral vertigo, unspecified ear: Secondary | ICD-10-CM | POA: Diagnosis not present

## 2018-11-07 DIAGNOSIS — Z79899 Other long term (current) drug therapy: Secondary | ICD-10-CM | POA: Diagnosis not present

## 2018-11-07 LAB — URINALYSIS, ROUTINE W REFLEX MICROSCOPIC
Bilirubin Urine: NEGATIVE
Glucose, UA: NEGATIVE mg/dL
Hgb urine dipstick: NEGATIVE
Ketones, ur: NEGATIVE mg/dL
Leukocytes,Ua: NEGATIVE
Nitrite: NEGATIVE
Protein, ur: NEGATIVE mg/dL
Specific Gravity, Urine: 1.003 — ABNORMAL LOW (ref 1.005–1.030)
pH: 8 (ref 5.0–8.0)

## 2018-11-07 LAB — DIFFERENTIAL
Abs Immature Granulocytes: 0.02 10*3/uL (ref 0.00–0.07)
Basophils Absolute: 0 10*3/uL (ref 0.0–0.1)
Basophils Relative: 1 %
Eosinophils Absolute: 0.2 10*3/uL (ref 0.0–0.5)
Eosinophils Relative: 4 %
Immature Granulocytes: 0 %
Lymphocytes Relative: 32 %
Lymphs Abs: 1.7 10*3/uL (ref 0.7–4.0)
Monocytes Absolute: 0.3 10*3/uL (ref 0.1–1.0)
Monocytes Relative: 6 %
Neutro Abs: 3.1 10*3/uL (ref 1.7–7.7)
Neutrophils Relative %: 57 %

## 2018-11-07 LAB — COMPREHENSIVE METABOLIC PANEL
ALT: 16 U/L (ref 0–44)
AST: 29 U/L (ref 15–41)
Albumin: 3.6 g/dL (ref 3.5–5.0)
Alkaline Phosphatase: 88 U/L (ref 38–126)
Anion gap: 9 (ref 5–15)
BUN: 18 mg/dL (ref 8–23)
CO2: 26 mmol/L (ref 22–32)
Calcium: 9.3 mg/dL (ref 8.9–10.3)
Chloride: 107 mmol/L (ref 98–111)
Creatinine, Ser: 1.36 mg/dL — ABNORMAL HIGH (ref 0.44–1.00)
GFR calc Af Amer: 44 mL/min — ABNORMAL LOW (ref >60)
GFR calc non Af Amer: 38 mL/min — ABNORMAL LOW (ref >60)
Glucose, Bld: 107 mg/dL — ABNORMAL HIGH (ref 70–99)
Potassium: 3.9 mmol/L (ref 3.5–5.1)
Sodium: 142 mmol/L (ref 135–145)
Total Bilirubin: 0.5 mg/dL (ref 0.3–1.2)
Total Protein: 7.5 g/dL (ref 6.5–8.1)

## 2018-11-07 LAB — I-STAT CHEM 8, ED
BUN: 21 mg/dL (ref 8–23)
Calcium, Ion: 1.22 mmol/L (ref 1.15–1.40)
Chloride: 107 mmol/L (ref 98–111)
Creatinine, Ser: 1.4 mg/dL — ABNORMAL HIGH (ref 0.44–1.00)
Glucose, Bld: 106 mg/dL — ABNORMAL HIGH (ref 70–99)
HCT: 40 % (ref 36.0–46.0)
Hemoglobin: 13.6 g/dL (ref 12.0–15.0)
Potassium: 3.9 mmol/L (ref 3.5–5.1)
Sodium: 142 mmol/L (ref 135–145)
TCO2: 29 mmol/L (ref 22–32)

## 2018-11-07 LAB — CBC
HCT: 39.6 % (ref 36.0–46.0)
Hemoglobin: 12.9 g/dL (ref 12.0–15.0)
MCH: 29 pg (ref 26.0–34.0)
MCHC: 32.6 g/dL (ref 30.0–36.0)
MCV: 89 fL (ref 80.0–100.0)
Platelets: 249 10*3/uL (ref 150–400)
RBC: 4.45 MIL/uL (ref 3.87–5.11)
RDW: 13.4 % (ref 11.5–15.5)
WBC: 5.4 10*3/uL (ref 4.0–10.5)
nRBC: 0 % (ref 0.0–0.2)

## 2018-11-07 LAB — RAPID URINE DRUG SCREEN, HOSP PERFORMED
Amphetamines: NOT DETECTED
Barbiturates: NOT DETECTED
Benzodiazepines: NOT DETECTED
Cocaine: NOT DETECTED
Opiates: NOT DETECTED
Tetrahydrocannabinol: NOT DETECTED

## 2018-11-07 LAB — APTT: aPTT: 37 seconds — ABNORMAL HIGH (ref 24–36)

## 2018-11-07 LAB — PROTIME-INR
INR: 1.2 (ref 0.8–1.2)
Prothrombin Time: 15.4 seconds — ABNORMAL HIGH (ref 11.4–15.2)

## 2018-11-07 LAB — ETHANOL: Alcohol, Ethyl (B): <10 mg/dL (ref <10)

## 2018-11-07 MED ORDER — DIAZEPAM 2 MG PO TABS
2.0000 mg | ORAL_TABLET | Freq: Once | ORAL | Status: AC
  Administered 2018-11-07: 2 mg via ORAL
  Filled 2018-11-07: qty 1

## 2018-11-07 MED ORDER — MECLIZINE HCL 25 MG PO TABS: 25.0000 mg | ORAL_TABLET | Freq: Two times a day (BID) | ORAL | 0 refills | Status: DC

## 2018-11-07 MED ORDER — MECLIZINE HCL 25 MG PO TABS
25.0000 mg | ORAL_TABLET | Freq: Once | ORAL | Status: AC
  Administered 2018-11-07: 25 mg via ORAL
  Filled 2018-11-07: qty 1

## 2018-11-07 NOTE — ED Triage Notes (Signed)
Pt BIB [facility] for dizziness that started suddenly about an hour ago. Pt reports that it happens every time she sits up or tries to stand up. Pt reports she does have a history of vertigo but this feels a little different. Pt is alert and oriented x4 at present time.

## 2018-11-07 NOTE — ED Notes (Signed)
Patient transported to CT 

## 2018-11-07 NOTE — ED Provider Notes (Signed)
Care handoff received from Arthor CaptainAbigail Harris, PA-C at shift change please see her note for full details.  In short 73 year old female with history of atrial fibrillation on Eliquis, CVA, CKD, factor V Leiden, hyperlipidemia and hypertension presents today for sudden onset dizziness at 12:30 PM.  Previous work-up: UDS negative I-STAT Chem-8 creatinine 1.40 nonacute CMP nonacute Urinalysis nonacute Ethanol negative APTT 37 Prothrombin time 15.4 INR within normal limits CBC within normal limits CT head:    IMPRESSION:  Chronic white matter ischemic changes without acute abnormality.  -------------- Patient given Valium and meclizine by previous team.  Patient's dizziness is positional and felt to be peripheral by previous team.  Plan of care at shift handoff was to await ambulation of the patient and then discharge with meclizine and Epley's maneuver handout and potentially a walker if needed from case management.  No further work-up at this time. Physical Exam  BP 118/73   Pulse (!) 54   Temp 98.2 F (36.8 C) (Oral)   Resp 14   Ht 5' 2.5" (1.588 m)   SpO2 98%   BMI 33.30 kg/m   Physical Exam Constitutional:      General: She is not in acute distress.    Appearance: Normal appearance. She is well-developed. She is not ill-appearing or diaphoretic.  HENT:     Head: Normocephalic and atraumatic.     Right Ear: External ear normal.     Left Ear: External ear normal.     Nose: Nose normal.  Eyes:     General: Vision grossly intact. Gaze aligned appropriately.     Pupils: Pupils are equal, round, and reactive to light.  Neck:     Musculoskeletal: Normal range of motion.     Trachea: Trachea and phonation normal. No tracheal deviation.  Pulmonary:     Effort: Pulmonary effort is normal. No respiratory distress.  Abdominal:     General: There is no distension.     Palpations: Abdomen is soft.     Tenderness: There is no abdominal tenderness. There is no guarding or rebound.   Musculoskeletal: Normal range of motion.  Skin:    General: Skin is warm and dry.  Neurological:     Mental Status: She is alert.     GCS: GCS eye subscore is 4. GCS verbal subscore is 5. GCS motor subscore is 6.     Comments: Speech is clear and goal oriented, follows commands Major Cranial nerves without deficit, no facial droop Normal strength in upper and lower extremities bilaterally including dorsiflexion and plantar flexion, strong and equal grip strength Sensation normal to light and sharp touch Moves extremities without ataxia, coordination intact Normal finger to nose and rapid alternating movements No pronator drift  Psychiatric:        Behavior: Behavior normal.    ED Course/Procedures   Clinical Course as of Nov 07 1706  Sun Nov 07, 2018  1604 Walk then home with meclizine/[facility]; walker if needed.   [BM]  1647 Informed by RN that patient has ambulated without assistance, steady gait and is symptom-free.  Patient requesting discharge.   [BM]    Clinical Course User Index [BM] Bill SalinasMorelli, Jonika Critz A, PA-C    Procedures  MDM  Patient has ambulated around the emergency department with nursing staff.  Ambulation steady without assistance.  Patient reports that she is symptom-free at this time and is requesting discharge.  She is been given prescription for meclizine and an Epley maneuver handout.  She does  not need a walker today.  I have given her ENT referral for further treatment of her peripheral vertigo.  Patient notes on reevaluation that she has had dizziness similar before occasionally when she looks down at her dog, agree with previous team that this is likely peripheral vertigo do not suspect CVA or other acute CNS process at this time.  At this time there does not appear to be any evidence of an acute emergency medical condition and the patient appears stable for discharge with appropriate outpatient follow up. Diagnosis was discussed with patient  who verbalizes understanding of care plan and is agreeable to discharge. I have discussed return precautions with patient who verbalizes understanding of return precautions. Patient encouraged to follow-up with their PCP and ENT. All questions answered. Patient has been discharged in good condition.  Patient's case discussed with Dr. Maryan Rued who agrees with plan to discharge with follow-up.   Note: Portions of this report may have been transcribed using voice recognition software. Every effort was made to ensure accuracy; however, inadvertent computerized transcription errors may still be present.   Gari Crown 11/07/18 Janan Ridge, MD 11/07/18 [phone]

## 2018-11-07 NOTE — Discharge Instructions (Signed)
You have been diagnosed today with Dizziness and peripheral vertigo.  At this time there does not appear to be the presence of an emergent medical condition, however there is always the potential for conditions to change. Please read and follow the below instructions.  Please return to the Emergency Department immediately for any new or worsening symptoms. Please be sure to follow up with your Primary Care Provider within one week regarding your visit today; please call their office to schedule an appointment even if you are feeling better for a follow-up visit. You may use the medication meclizine as prescribed to help with your vertigo.  You may also call the specialist at [facility] ear nose and throat as needed for further vertigo, call their office on Monday to schedule a follow-up appointment.  Get help right away if: You have trouble moving or talking. You are always dizzy. You pass out (faint). You get very bad headaches. You feel weak in your hands, arms, or legs. You have changes in your hearing. You have changes in how you see (vision). You get a stiff neck. Bright light starts to bother you. Any new/concerning or worsening symptoms  Please read the additional information packets attached to your discharge summary.  Do not take your medicine if  develop an itchy rash, swelling in your mouth or lips, or difficulty breathing; call 911 and seek immediate emergency medical attention if this occurs.

## 2018-11-07 NOTE — ED Provider Notes (Signed)
[facility] DEPART[facility] Provider Note   CSN: [id number] Arrival date & time: 11/07/18  1357     History   Chief Complaint Chief Complaint  Patient presents with  . Dizziness    HPI Jasmine Dawson is a 73 y.o. female with a past medical history of atrial fibrillation on Eliquis, chronic kidney disease, history of factor V Leiden mutation, hyperlipidemia, hypertension, previous history of stroke.  She has previous have history of self limited episodes of vertigo in the past but states it is been many years.  Today around 12:30 PM she had onset of room spinning dizziness.  She has no dizziness when she is lying still.  She feels a bit dizzy when she sits up or stands and felt too unsteady to walk today.  She denies any other neurologic symptoms such as headache, change in vision, weakness, difficulty with speech.     HPI  Past Medical History:  Diagnosis Date  . CKD (chronic kidney disease), stage III ([place])   . Heterozygous factor V Leiden mutation ([place])   . Hyperlipidemia   . Hypertension   . Motion sickness   . Prothrombin gene mutation ([place])   . Pulmonary hypertension ([place])    a. 2D echo 11/01/17 showed EF 65-70%, grade 1 DD, mod-severe TR, moderately increased PASP 61mmHg.  [name] PVC's (premature ventricular contractions)    occasional PVCs  . Sinus bradycardia    a. requiring reduction in beta blocker in 2020.  [name] Stroke (cerebrum) ([place])   . SVT (supraventricular tachycardia) ([place])   . Tricuspid regurgitation     Patient Active Problem List   Diagnosis Date Noted  . Hypertensive urgency   . Right arm weakness   . Cerebral embolism with cerebral infarction 07/29/2018  . Weakness 07/28/2018  . Chest pain 10/31/2017  . Palpitations 10/31/2017  . Right leg pain 10/31/2017  . Elevated troponin 10/31/2017  . SVT (supraventricular tachycardia) ([place]) 10/03/2016  . CRI (chronic renal insufficiency), stage 3 (moderate) ([place]) 10/03/2016  .  Endometrial polyp 11/07/2011  . Dizzy spells 09/11/2011  . HTN (hypertension) 09/11/2011  . Abdominal pain 09/11/2011  . Cholelithiasis 09/11/2011    Past Surgical History:  Procedure Laterality Date  . HYSTEROSCOPY W/D&C  11/07/2011   Procedure: DILATATION AND CURETTAGE /HYSTEROSCOPY;  Surgeon: Lahoma Crocker, MD;  Location: [facility];  Service: Gynecology;  Laterality: N/A;  endometrial polypectomy  . LOOP RECORDER INSERTION N/A 07/30/2018   Procedure: LOOP RECORDER INSERTION;  Surgeon: Constance Haw, MD;  Location: [facility];  Service: Cardiovascular;  Laterality: N/A;  . TUBAL LIGATION  1977     OB History   No obstetric history on file.      Home Medications    Prior to Admission medications   Medication Sig Start Date End Date Taking? Authorizing Provider  apixaban (ELIQUIS) 5 MG TABS tablet Take 1 tablet (5 mg total) by mouth 2 (two) times daily. 08/30/18   Camnitz, Will Hassell Done, MD  atenolol (TENORMIN) 50 MG tablet Take 50 mg by mouth daily.    [provider]  famotidine (PEPCID) 20 MG tablet Take 1 tablet (20 mg total) by mouth 2 (two) times daily. 05/19/15   Larene Pickett, PA-C  ondansetron (ZOFRAN) 4 MG tablet Take 1 tablet (4 mg total) by mouth every 8 (eight) hours as needed for nausea or vomiting. 08/26/18   Margarita Mail, PA-C  rosuvastatin (CRESTOR) 40 MG tablet Take 1 tablet (40 mg total) by mouth  daily at 6 PM. 07/31/18   Yvette RackAgyei, Obed K, MD    Family History Family History  Problem Relation Age of Onset  . Diabetes Mother        died at 2591  . Heart attack Mother   . Heart attack Father        died at 5353    Social History Social History   Tobacco Use  . Smoking status: Never Smoker  . Smokeless tobacco: Never Used  Substance Use Topics  . Alcohol use: No  . Drug use: No     Allergies   Eggs or egg-derived products, Orange fruit [citrus], Prednisone, Sulfa antibiotics, Tomato, Influenza vaccines, Aspartame and phenylalanine,  Caffeine, and Shellfish allergy   Review of Systems Review of Systems Ten systems reviewed and are negative for acute change, except as noted in the HPI.    Physical Exam Updated Vital Signs Pulse 62   Temp 98.2 F (36.8 C) (Oral)   Resp 14   Ht 5' 2.5" (1.588 m)   SpO2 98%   BMI 33.30 kg/m   Physical Exam Vitals signs and nursing note reviewed.  Constitutional:      General: She is not in acute distress.    Appearance: She is well-developed. She is not diaphoretic.  HENT:     Head: Normocephalic and atraumatic.  Eyes:     General: No visual field deficit or scleral icterus.    Conjunctiva/sclera: Conjunctivae normal.     Pupils: Pupils are equal, round, and reactive to light.     Comments: Nystagmus to the Right only  Neck:     Musculoskeletal: Normal range of motion.  Cardiovascular:     Rate and Rhythm: Normal rate and regular rhythm.     Heart sounds: Normal heart sounds. No murmur. No friction rub. No gallop.   Pulmonary:     Effort: Pulmonary effort is normal. No respiratory distress.     Breath sounds: Normal breath sounds.  Abdominal:     General: Bowel sounds are normal. There is no distension.     Palpations: Abdomen is soft. There is no mass.     Tenderness: There is no abdominal tenderness. There is no guarding.  Skin:    General: Skin is warm and dry.  Neurological:     Mental Status: She is alert and oriented to person, place, and time.     GCS: GCS eye subscore is 4. GCS verbal subscore is 5. GCS motor subscore is 6.     Cranial Nerves: No dysarthria or facial asymmetry.     Sensory: Sensation is intact.     Motor: Motor function is intact.     Coordination: Coordination normal. Finger-Nose-Finger Test and [facility]el to [facility] normal.     Deep Tendon Reflexes: Reflexes are normal and symmetric.     Comments: Patient able to stand unassisted at the bed  Did not test gait at this time.  Psychiatric:        Behavior: Behavior normal.      ED  Treatments / Results  Labs (all labs ordered are listed, but only abnormal results are displayed) Labs Reviewed  ETHANOL  PROTIME-INR  APTT  CBC  DIFFERENTIAL  COMPREHENSIVE METABOLIC PANEL  RAPID URINE DRUG SCREEN, HOSP PERFORMED  URINALYSIS, ROUTINE W REFLEX MICROSCOPIC  I-STAT CHEM 8, ED    EKG None  Radiology No results found.  Procedures Procedures (including critical care time)  Medications Ordered in ED Medications  meclizine (ANTIVERT) tablet  25 mg (has no administration in time range)     Initial Impression / Assessment and Plan / ED Course  I have reviewed the triage vital signs and the nursing notes.  Pertinent labs & imaging results that were available during my care of the patient were reviewed by me and considered in my medical decision making (see chart for details).  Clinical Course as of Nov 09 2333  Wynelle LinkSun Nov 07, 2018  1604 Walk then home with meclizine/[facility] handout; walker if needed.   [BM]  1647 Informed by RN that patient has ambulated without assistance, steady gait and is symptom-free.  Patient requesting discharge.   [BM]  Wed Nov 10, 2018  2334 Creatinine(!): 1.36 [AH]    Clinical Course User Index [AH] Arthor CaptainHarris, Saidy Ormand, PA-C [BM] Bill SalinasMorelli, Brandon A, PA-C      Patient here with recurrent, vertiginous symptoms.  Patient appears to have peripheral vertigo with symptoms that are exacerbated with movement and better when she is still.  Patient case discussed with Dr. Juleen ChinaKohut who is in agreement.  Do not feel she needs any further imaging or work-up for posterior circulation stroke at this time.  Given signout to PA Clifton GardensMorelli plan is to try to ambulate the patient by herself or with ambulatory assistive device.  I have reviewed the patient's labs which shows mild baseline renal insufficiency.  Urine without infection.  CBC without abnormality.  I personally reviewed the patient's head CT which shows no acute abnormalities.  EKG Interpretation   Date/Time:  Sunday November 07 2018 14:16:12 EDT Ventricular Rate:  57 PR Interval:    QRS Duration: 101 QT Interval:  446 QTC Calculation: 435 R Axis:   48 Text Interpretation:  Sinus rhythm Confirmed by Gilda CreasePollina, Christopher J [phone]) on 11/08/2018 9:08:34 PM        Final Clinical Impressions(s) / ED Diagnoses   Final diagnoses:  None    ED Discharge Orders    None       Arthor CaptainHarris, Singleton Hickox, PA-C 11/10/18 2335    Raeford RazorKohut, Stephen, MD 11/11/18 1120

## 2018-11-07 NOTE — ED Notes (Signed)
Patient verbalizes understanding of discharge instructions. Opportunity for questioning and answers were provided. Armband removed by staff, pt discharged from ED.  

## 2018-11-08 ENCOUNTER — Ambulatory Visit (INDEPENDENT_AMBULATORY_CARE_PROVIDER_SITE_OTHER): Payer: Medicare Other | Admitting: *Deleted

## 2018-11-08 DIAGNOSIS — I471 Supraventricular tachycardia: Secondary | ICD-10-CM | POA: Diagnosis not present

## 2018-11-08 LAB — CUP PACEART REMOTE DEVICE CHECK
Date Time Interrogation Session: 20200711180924
Implantable Pulse Generator Implant Date: 20200403

## 2018-11-22 NOTE — Progress Notes (Signed)
Carelink Summary Report / Loop Recorder 

## 2018-11-29 ENCOUNTER — Other Ambulatory Visit: Payer: Self-pay | Admitting: Internal Medicine

## 2018-12-09 ENCOUNTER — Ambulatory Visit (INDEPENDENT_AMBULATORY_CARE_PROVIDER_SITE_OTHER): Payer: Medicare Other | Admitting: *Deleted

## 2018-12-09 DIAGNOSIS — I63412 Cerebral infarction due to embolism of left middle cerebral artery: Secondary | ICD-10-CM

## 2018-12-10 LAB — CUP PACEART REMOTE DEVICE CHECK
Date Time Interrogation Session: 20200813201034
Implantable Pulse Generator Implant Date: 20200403

## 2018-12-20 NOTE — Progress Notes (Signed)
Carelink Summary Report / Loop Recorder 

## 2018-12-29 ENCOUNTER — Emergency Department (HOSPITAL_COMMUNITY)
Admission: EM | Admit: 2018-12-29 | Discharge: 2018-12-29 | Disposition: A | Discharge status: Home / Self Care | Payer: Medicare Other | Attending: Emergency Medicine | Admitting: Emergency Medicine

## 2018-12-29 ENCOUNTER — Encounter (HOSPITAL_COMMUNITY): Payer: Self-pay | Admitting: Emergency Medicine

## 2018-12-29 DIAGNOSIS — I129 Hypertensive chronic kidney disease with stage 1 through stage 4 chronic kidney disease, or unspecified chronic kidney disease: Secondary | ICD-10-CM | POA: Insufficient documentation

## 2018-12-29 DIAGNOSIS — D682 Hereditary deficiency of other clotting factors: Secondary | ICD-10-CM | POA: Diagnosis not present

## 2018-12-29 DIAGNOSIS — Z79899 Other long term (current) drug therapy: Secondary | ICD-10-CM | POA: Insufficient documentation

## 2018-12-29 DIAGNOSIS — Z7901 Long term (current) use of anticoagulants: Secondary | ICD-10-CM | POA: Insufficient documentation

## 2018-12-29 DIAGNOSIS — K625 Hemorrhage of anus and rectum: Secondary | ICD-10-CM | POA: Insufficient documentation

## 2018-12-29 DIAGNOSIS — R42 Dizziness and giddiness: Secondary | ICD-10-CM | POA: Diagnosis not present

## 2018-12-29 DIAGNOSIS — N183 Chronic kidney disease, stage 3 (moderate): Secondary | ICD-10-CM | POA: Insufficient documentation

## 2018-12-29 DIAGNOSIS — Z8673 Personal history of transient ischemic attack (TIA), and cerebral infarction without residual deficits: Secondary | ICD-10-CM | POA: Insufficient documentation

## 2018-12-29 LAB — COMPREHENSIVE METABOLIC PANEL
ALT: 13 U/L (ref 0–44)
AST: 29 U/L (ref 15–41)
Albumin: 3.8 g/dL (ref 3.5–5.0)
Alkaline Phosphatase: 88 U/L (ref 38–126)
Anion gap: 12 (ref 5–15)
BUN: 27 mg/dL — ABNORMAL HIGH (ref 8–23)
CO2: 24 mmol/L (ref 22–32)
Calcium: 9.3 mg/dL (ref 8.9–10.3)
Chloride: 103 mmol/L (ref 98–111)
Creatinine, Ser: 1.86 mg/dL — ABNORMAL HIGH (ref 0.44–1.00)
GFR calc Af Amer: 30 mL/min — ABNORMAL LOW (ref >60)
GFR calc non Af Amer: 26 mL/min — ABNORMAL LOW (ref >60)
Glucose, Bld: 127 mg/dL — ABNORMAL HIGH (ref 70–99)
Potassium: 3.7 mmol/L (ref 3.5–5.1)
Sodium: 139 mmol/L (ref 135–145)
Total Bilirubin: 0.3 mg/dL (ref 0.3–1.2)
Total Protein: 7.3 g/dL (ref 6.5–8.1)

## 2018-12-29 LAB — CBC
HCT: 39.7 % (ref 36.0–46.0)
Hemoglobin: 12.8 g/dL (ref 12.0–15.0)
MCH: 29 pg (ref 26.0–34.0)
MCHC: 32.2 g/dL (ref 30.0–36.0)
MCV: 89.8 fL (ref 80.0–100.0)
Platelets: 243 10*3/uL (ref 150–400)
RBC: 4.42 MIL/uL (ref 3.87–5.11)
RDW: 13.6 % (ref 11.5–15.5)
WBC: 4.9 10*3/uL (ref 4.0–10.5)
nRBC: 0 % (ref 0.0–0.2)

## 2018-12-29 LAB — POC OCCULT BLOOD, ED: Fecal Occult Bld: NEGATIVE

## 2018-12-29 NOTE — ED Notes (Signed)
Dizziness since she started taking zarelto since around may  She thinks its that med that has been ordered

## 2018-12-29 NOTE — Discharge Instructions (Signed)
Make an appointment follow-up with gastroenterology.  Phone number provided above.  Follow-up with your primary care doctor as scheduled for next week.  Also make an appointment to follow back up with cardiology discuss whether they want to continue on Eliquis and she do not like the symptoms.   Return for any worsening of the rectal bleeding.  In particular return if there are 2 bloody bowel movements in a day.

## 2018-12-29 NOTE — ED Notes (Signed)
tia in april

## 2018-12-29 NOTE — ED Triage Notes (Signed)
Pt reports rectal bleeding that started last night has a small smear of blood after wiping after a BM. Pt states this am she had a BM that was "all red". Pt is on a blood thinner. Pt states sudden movements make her feel "faint".

## 2018-12-29 NOTE — ED Provider Notes (Signed)
[facility] EMERGENCY DEPARTMENT Provider Note   CSN: [id number] Arrival date & time: 12/29/18  1151     History   Chief Complaint Chief Complaint  Patient presents with  . Rectal Bleeding    HPI Jasmine Dawson is a 73 y.o. female.     Patient presents with a complaint of rectal bleeding that started last night.  Initially it was just a small smear of blood after wiping.  But then this morning she had a bowel movement that had redness to it but it did not stain the commode water all red.  Patient is on a blood thinner Eliquis following a stroke plus patient also has Leiden factor V abnormality.  Review of patient's past chart shows that she was originally recommended not to be on anticoagulation since she did not have any history of problems but following the stroke she was started on that.  Also patient's had a history of supraventricular tachycardia and tricuspid regurgitation.  Has been already mentioned has the prothrombin gene mutation.  Hypertension as well as chronic kidney disease stage III.  Patient has never had any staining of the commode water all red.  Also denies any black bowel movements.  Patient had a long weight unfortunately in the waiting room and had no bloody bowel movements during that weight.  Patient has been complaining of lightheadedness and some non-vertigo dizziness since starting the Eliquis.  That is been going on for months.  This been going on since April.  Nothing new or different about that.     Past Medical History:  Diagnosis Date  . CKD (chronic kidney disease), stage III ([place])   . Heterozygous factor V Leiden mutation ([place])   . Hyperlipidemia   . Hypertension   . Motion sickness   . Prothrombin gene mutation ([place])   . Pulmonary hypertension ([place])    a. 2D echo 11/01/17 showed EF 65-70%, grade 1 DD, mod-severe TR, moderately increased PASP 66mmHg.  [name] PVC's (premature ventricular contractions)    occasional PVCs  . Sinus  bradycardia    a. requiring reduction in beta blocker in 2020.  [name] Stroke (cerebrum) ([place])   . SVT (supraventricular tachycardia) ([place])   . Tricuspid regurgitation     Patient Active Problem List   Diagnosis Date Noted  . Hypertensive urgency   . Right arm weakness   . Cerebral embolism with cerebral infarction 07/29/2018  . Weakness 07/28/2018  . Chest pain 10/31/2017  . Palpitations 10/31/2017  . Right leg pain 10/31/2017  . Elevated troponin 10/31/2017  . SVT (supraventricular tachycardia) ([place]) 10/03/2016  . CRI (chronic renal insufficiency), stage 3 (moderate) ([place]) 10/03/2016  . Endometrial polyp 11/07/2011  . Dizzy spells 09/11/2011  . HTN (hypertension) 09/11/2011  . Abdominal pain 09/11/2011  . Cholelithiasis 09/11/2011    Past Surgical History:  Procedure Laterality Date  . HYSTEROSCOPY W/D&C  11/07/2011   Procedure: DILATATION AND CURETTAGE /HYSTEROSCOPY;  Surgeon: Lahoma Crocker, MD;  Location: [facility];  Service: Gynecology;  Laterality: N/A;  endometrial polypectomy  . LOOP RECORDER INSERTION N/A 07/30/2018   Procedure: LOOP RECORDER INSERTION;  Surgeon: Constance Haw, MD;  Location: [facility];  Service: Cardiovascular;  Laterality: N/A;  . TUBAL LIGATION  1977     OB History   No obstetric history on file.      Home Medications    Prior to Admission medications   Medication Sig Start Date End Date Taking? Authorizing Provider  acetaminophen (TYLENOL)  650 MG CR tablet Take 650 mg by mouth every 8 (eight) hours as needed for pain.    [provider]  apixaban (ELIQUIS) 5 MG TABS tablet Take 1 tablet (5 mg total) by mouth 2 (two) times daily. 08/30/18   Camnitz, Will Daphine DeutscherMartin, MD  atenolol (TENORMIN) 50 MG tablet Take 50 mg by mouth daily.    [provider]  carboxymethylcellulose (REFRESH PLUS) 0.5 % SOLN Place 1 drop into both eyes daily as needed (for dry eyes).    [provider]  famotidine (PEPCID) 20 MG tablet  Take 1 tablet (20 mg total) by mouth 2 (two) times daily. 05/19/15   Garlon HatchetSanders, Lisa M, PA-C  meclizine (ANTIVERT) 25 MG tablet Take 1 tablet (25 mg total) by mouth 2 (two) times daily. 11/07/18   Harlene SaltsMorelli, Brandon A, PA-C  ondansetron (ZOFRAN) 4 MG tablet Take 1 tablet (4 mg total) by mouth every 8 (eight) hours as needed for nausea or vomiting. 08/26/18   Arthor CaptainHarris, Abigail, PA-C  rosuvastatin (CRESTOR) 40 MG tablet Take 1 tablet (40 mg total) by mouth daily at 6 PM. 07/31/18   Yvette RackAgyei, Obed K, MD    Family History Family History  Problem Relation Age of Onset  . Diabetes Mother        died at 5291  . Heart attack Mother   . Heart attack Father        died at 7153    Social History Social History   Tobacco Use  . Smoking status: Never Smoker  . Smokeless tobacco: Never Used  Substance Use Topics  . Alcohol use: No  . Drug use: No     Allergies   Eggs or egg-derived products, Orange fruit [citrus], Prednisone, Sulfa antibiotics, Tomato, Influenza vaccines, Aspartame and phenylalanine, Caffeine, and Shellfish allergy   Review of Systems Review of Systems  Constitutional: Negative for chills and fever.  HENT: Negative for congestion, rhinorrhea and sore throat.   Eyes: Negative for visual disturbance.  Respiratory: Negative for cough and shortness of breath.   Cardiovascular: Negative for chest pain and leg swelling.  Gastrointestinal: Negative for abdominal pain, diarrhea, nausea and vomiting.  Genitourinary: Negative for dysuria.  Musculoskeletal: Negative for back pain and neck pain.  Skin: Negative for rash.  Neurological: Positive for dizziness and light-headedness. Negative for headaches.  Hematological: Does not bruise/bleed easily.  Psychiatric/Behavioral: Negative for confusion.     Physical Exam Updated Vital Signs BP (!) 179/71   Pulse (!) 56   Temp 98.4 F (36.9 C) (Oral)   Resp 14   SpO2 99%   Physical Exam Vitals signs and nursing note reviewed.   Constitutional:      General: She is not in acute distress.    Appearance: Normal appearance. She is well-developed.  HENT:     Head: Normocephalic and atraumatic.  Eyes:     Extraocular Movements: Extraocular movements intact.     Conjunctiva/sclera: Conjunctivae normal.     Pupils: Pupils are equal, round, and reactive to light.  Neck:     Musculoskeletal: Normal range of motion and neck supple.  Cardiovascular:     Rate and Rhythm: Normal rate and regular rhythm.     Heart sounds: No murmur.  Pulmonary:     Effort: Pulmonary effort is normal. No respiratory distress.     Breath sounds: Normal breath sounds.  Abdominal:     General: Bowel sounds are normal.     Palpations: Abdomen is soft.  Tenderness: There is no abdominal tenderness.  Genitourinary:    Rectum: Normal. Guaiac result negative.     Comments: Rectal exam no gross blood.  Bowel movement light brown.  No external hemorrhoids no prolapsed internal hemorrhoids no evidence of fissure.  Hemoccult card sent to the lab. Skin:    General: Skin is warm and dry.  Neurological:     Mental Status: She is alert.      ED Treatments / Results  Labs (all labs ordered are listed, but only abnormal results are displayed) Labs Reviewed  COMPREHENSIVE METABOLIC PANEL - Abnormal; Notable for the following components:      Result Value   Glucose, Bld 127 (*)    BUN 27 (*)    Creatinine, Ser 1.86 (*)    GFR calc non Af Amer 26 (*)    GFR calc Af Amer 30 (*)    All other components within normal limits  CBC  POC OCCULT BLOOD, ED    EKG None  Radiology No results found.  Procedures Procedures (including critical care time)  Medications Ordered in ED Medications - No data to display   Initial Impression / Assessment and Plan / ED Course  I have reviewed the triage vital signs and the nursing notes.  Pertinent labs & imaging results that were available during my care of the patient were reviewed by me and  considered in my medical decision making (see chart for details).        Patient's hematocrit and hemoglobin is stable.  No evidence of any significant blood loss.  Vital signs are normal.  Actually on the hypertensive side.  No gross blood or melena on rectal exam.  Think patient will be stable for discharge home.  Can follow back up with Dr. Eldridge Dace to see if they want to change the Eliquis.  Patient would prefer that.  But we will not make any changes here today.  Patient will be given instructions to follow-up with gastroenterology for colonoscopy.  And follow back up with her primary care doctor for reevaluation.  Already has an appointment for next week.  Hemoccult was negative.  Final Clinical Impressions(s) / ED Diagnoses   Final diagnoses:  Rectal bleeding    ED Discharge Orders    None       Vanetta Mulders, MD 12/29/18 2113

## 2019-01-11 ENCOUNTER — Ambulatory Visit (INDEPENDENT_AMBULATORY_CARE_PROVIDER_SITE_OTHER): Payer: Medicare Other | Admitting: *Deleted

## 2019-01-11 DIAGNOSIS — I471 Supraventricular tachycardia: Secondary | ICD-10-CM | POA: Diagnosis not present

## 2019-01-11 LAB — CUP PACEART REMOTE DEVICE CHECK
Date Time Interrogation Session: 20200915203907
Implantable Pulse Generator Implant Date: 20200403

## 2019-01-19 NOTE — Progress Notes (Signed)
Carelink Summary Report / Loop Recorder 

## 2019-02-14 ENCOUNTER — Ambulatory Visit (INDEPENDENT_AMBULATORY_CARE_PROVIDER_SITE_OTHER): Payer: Medicare Other | Admitting: *Deleted

## 2019-02-14 DIAGNOSIS — I471 Supraventricular tachycardia: Secondary | ICD-10-CM | POA: Diagnosis not present

## 2019-02-14 DIAGNOSIS — I63412 Cerebral infarction due to embolism of left middle cerebral artery: Secondary | ICD-10-CM

## 2019-02-14 LAB — CUP PACEART REMOTE DEVICE CHECK
Date Time Interrogation Session: 20201018204106
Implantable Pulse Generator Implant Date: 20200403

## 2019-03-07 NOTE — Progress Notes (Signed)
Carelink Summary Report / Loop Recorder 

## 2019-03-11 ENCOUNTER — Other Ambulatory Visit: Payer: Self-pay | Admitting: Cardiology

## 2019-03-11 NOTE — Telephone Encounter (Signed)
Eliquis 5mg  refill request received. Pt is 73 yrs old, weight-83.9kg, Crea-1.86 on 12/29/2018, Diagnosis-Afib, and last seen by Dr. Curt Bears on 08/30/2018 via Telemedicine visit. Dose is appropriate based on dosing criteria. Will send in refill to requested pharmacy.

## 2019-03-18 ENCOUNTER — Ambulatory Visit (INDEPENDENT_AMBULATORY_CARE_PROVIDER_SITE_OTHER): Payer: Medicare Other | Admitting: *Deleted

## 2019-03-18 DIAGNOSIS — I63412 Cerebral infarction due to embolism of left middle cerebral artery: Secondary | ICD-10-CM

## 2019-03-18 LAB — CUP PACEART REMOTE DEVICE CHECK
Date Time Interrogation Session: 20201120153922
Implantable Pulse Generator Implant Date: 20200403

## 2019-04-06 ENCOUNTER — Telehealth: Payer: Self-pay | Admitting: *Deleted

## 2019-04-06 NOTE — Telephone Encounter (Signed)
Pt takes Eliquis 5mg  BID for afib with CHADS2VASc score of 6 (age x2, sex, HTN, CVA), also has hx of heterozygous factor V leiden mutation. SCr 1.86, CrCl 67mL/min using adjusted body weight, CBC is stable.  Recommend holding Eliquis for 1.5 days prior to procedure due to elevated cardiac risk in combination with renal dysfunction.

## 2019-04-06 NOTE — Telephone Encounter (Signed)
   [facility] Pre-operative Risk Assessment    Request for surgical clearance:  1. What type of surgery is being performed? COLONOSCOPY    2. When is this surgery scheduled? TBD SOMETIME 04/2019   3. What type of clearance is required (medical clearance vs. Pharmacy clearance to hold med vs. Both)? BOTH  4. Are there any medications that need to be held prior to surgery and how long? ELIQUIS TO BE HELD X 2 DAYS PRIOR   5. Practice name and name of physician performing surgery? [facility]; DR. Alessandra Bevels   6. What is your office phone number [phone]    7.   What is your office fax number [phone]  8.   Anesthesia type (None, local, MAC, general) ? PROPOFOL    Julaine Hua 04/06/2019, 1:57 PM  _________________________________________________________________   (provider comments below)

## 2019-04-06 NOTE — Telephone Encounter (Signed)
Pharmacy can you comment on Eliquis, then I will contact the patient

## 2019-04-07 NOTE — Telephone Encounter (Signed)
   Primary Cardiologist: Larae Grooms, MD  Chart reviewed and patient contacted by phone today as part of pre-operative protocol coverage. Given past medical history and time since last visit, based on ACC/AHA guidelines, Jasmine Dawson would be at acceptable risk for the planned procedure without further cardiovascular testing.   She can hold Eliquis 1.5 days pre op.  Her colonoscopy is scheduled for sometime next month, when she has a date she can call and we will give her specifics on what doses to hold.   I will route this recommendation to the requesting party via Epic fax function and remove from pre-op pool.  Please call with questions.  Kerin Ransom, PA-C 04/07/2019, 11:29 AM

## 2019-04-12 NOTE — Progress Notes (Signed)
Carelink Summary Report / Loop Recorder 

## 2019-04-16 ENCOUNTER — Other Ambulatory Visit: Payer: Self-pay

## 2019-04-16 ENCOUNTER — Encounter (HOSPITAL_COMMUNITY): Payer: Self-pay

## 2019-04-16 ENCOUNTER — Emergency Department (HOSPITAL_COMMUNITY)
Admission: EM | Admit: 2019-04-16 | Discharge: 2019-04-16 | Disposition: A | Discharge status: Home / Self Care | Payer: Medicare Other | Attending: Emergency Medicine | Admitting: Emergency Medicine

## 2019-04-16 DIAGNOSIS — I82409 Acute embolism and thrombosis of unspecified deep veins of unspecified lower extremity: Secondary | ICD-10-CM | POA: Insufficient documentation

## 2019-04-16 DIAGNOSIS — I129 Hypertensive chronic kidney disease with stage 1 through stage 4 chronic kidney disease, or unspecified chronic kidney disease: Secondary | ICD-10-CM | POA: Insufficient documentation

## 2019-04-16 DIAGNOSIS — R040 Epistaxis: Secondary | ICD-10-CM | POA: Insufficient documentation

## 2019-04-16 DIAGNOSIS — N183 Chronic kidney disease, stage 3 unspecified: Secondary | ICD-10-CM | POA: Diagnosis not present

## 2019-04-16 DIAGNOSIS — Z7901 Long term (current) use of anticoagulants: Secondary | ICD-10-CM | POA: Diagnosis not present

## 2019-04-16 DIAGNOSIS — Z79899 Other long term (current) drug therapy: Secondary | ICD-10-CM | POA: Diagnosis not present

## 2019-04-16 LAB — CBC WITH DIFFERENTIAL/PLATELET
Abs Immature Granulocytes: 0.01 10*3/uL (ref 0.00–0.07)
Basophils Absolute: 0 10*3/uL (ref 0.0–0.1)
Basophils Relative: 1 %
Eosinophils Absolute: 0.1 10*3/uL (ref 0.0–0.5)
Eosinophils Relative: 2 %
HCT: 39 % (ref 36.0–46.0)
Hemoglobin: 12.4 g/dL (ref 12.0–15.0)
Immature Granulocytes: 0 %
Lymphocytes Relative: 40 %
Lymphs Abs: 3.1 10*3/uL (ref 0.7–4.0)
MCH: 29.5 pg (ref 26.0–34.0)
MCHC: 31.8 g/dL (ref 30.0–36.0)
MCV: 92.9 fL (ref 80.0–100.0)
Monocytes Absolute: 0.6 10*3/uL (ref 0.1–1.0)
Monocytes Relative: 8 %
Neutro Abs: 3.9 10*3/uL (ref 1.7–7.7)
Neutrophils Relative %: 49 %
Platelets: 226 10*3/uL (ref 150–400)
RBC: 4.2 MIL/uL (ref 3.87–5.11)
RDW: 13.6 % (ref 11.5–15.5)
WBC: 7.8 10*3/uL (ref 4.0–10.5)
nRBC: 0 % (ref 0.0–0.2)

## 2019-04-16 LAB — BASIC METABOLIC PANEL
Anion gap: 11 (ref 5–15)
BUN: 34 mg/dL — ABNORMAL HIGH (ref 8–23)
CO2: 23 mmol/L (ref 22–32)
Calcium: 9 mg/dL (ref 8.9–10.3)
Chloride: 102 mmol/L (ref 98–111)
Creatinine, Ser: 1.89 mg/dL — ABNORMAL HIGH (ref 0.44–1.00)
GFR calc Af Amer: 30 mL/min — ABNORMAL LOW (ref >60)
GFR calc non Af Amer: 26 mL/min — ABNORMAL LOW (ref >60)
Glucose, Bld: 125 mg/dL — ABNORMAL HIGH (ref 70–99)
Potassium: 3.4 mmol/L — ABNORMAL LOW (ref 3.5–5.1)
Sodium: 136 mmol/L (ref 135–145)

## 2019-04-16 LAB — CBG MONITORING, ED: Glucose-Capillary: 119 mg/dL — ABNORMAL HIGH (ref 70–99)

## 2019-04-16 NOTE — ED Triage Notes (Signed)
Pt states she has had nose bleeding and rectal bleeding on and off all summer since she started taking eloquis.

## 2019-04-16 NOTE — ED Provider Notes (Signed)
[facility] Provider Note   CSN: [id number] Arrival date & time: 04/16/19  1626     History Chief Complaint  Patient presents with  . Epistaxis    Jasmine Dawson is a 73 y.o. female.  HPI 73 year old female on Eliquis for DVT presents today complaining that she has had some blood noted when she blows her nose from her right nares.  She states this is happened 2 times.  She has had no active bleeding.  She states she had noted some blood in her stool and had reported this to her doctor.  She is scheduled for follow-up colonoscopy.  She has not noted any more blood bleeding in her stool.  She states she does feel generally weak and cold since she started on Eliquis.  She denies any headache, head injury, cough, fever, sore throat, nausea, vomiting, or diarrhea.    Past Medical History:  Diagnosis Date  . CKD (chronic kidney disease), stage III   . Heterozygous factor V Leiden mutation (HCC)   . Hyperlipidemia   . Hypertension   . Motion sickness   . Prothrombin gene mutation (HCC)   . Pulmonary hypertension (HCC)    a. 2D echo 11/01/17 showed EF 65-70%, grade 1 DD, mod-severe TR, moderately increased PASP 47mmHg.  [name]. PVC's (premature ventricular contractions)    occasional PVCs  . Sinus bradycardia    a. requiring reduction in beta blocker in 2020.  [name]. Stroke (cerebrum) (HCC)   . SVT (supraventricular tachycardia) (HCC)   . Tricuspid regurgitation     Patient Active Problem List   Diagnosis Date Noted  . Hypertensive urgency   . Right arm weakness   . Cerebral embolism with cerebral infarction 07/29/2018  . Weakness 07/28/2018  . Chest pain 10/31/2017  . Palpitations 10/31/2017  . Right leg pain 10/31/2017  . Elevated troponin 10/31/2017  . SVT (supraventricular tachycardia) (HCC) 10/03/2016  . CRI (chronic renal insufficiency), stage 3 (moderate) (HCC) 10/03/2016  . Endometrial polyp 11/07/2011  . Dizzy spells 09/11/2011  .  HTN (hypertension) 09/11/2011  . Abdominal pain 09/11/2011  . Cholelithiasis 09/11/2011    Past Surgical History:  Procedure Laterality Date  . HYSTEROSCOPY WITH D & C  11/07/2011   Procedure: DILATATION AND CURETTAGE /HYSTEROSCOPY;  Surgeon: Antionette CharLisa Jackson-Moore, MD;  Location: [facility];  Service: Gynecology;  Laterality: N/A;  endometrial polypectomy  . LOOP RECORDER INSERTION N/A 07/30/2018   Procedure: LOOP RECORDER INSERTION;  Surgeon: Regan Lemmingamnitz, Will Martin, MD;  Location: [facility];  Service: Cardiovascular;  Laterality: N/A;  . TUBAL LIGATION  1977     OB History   No obstetric history on file.     Family History  Problem Relation Age of Onset  . Diabetes Mother        died at 5691  . Heart attack Mother   . Heart attack Father        died at 2053    Social History   Tobacco Use  . Smoking status: Never Smoker  . Smokeless tobacco: Never Used  Substance Use Topics  . Alcohol use: No  . Drug use: No    Home Medications Prior to Admission medications   Medication Sig Start Date End Date Taking? Authorizing Provider  acetaminophen (TYLENOL) 650 MG CR tablet Take 650 mg by mouth every 8 (eight) hours as needed for pain.    [provider]  atenolol (TENORMIN) 50 MG tablet Take 50 mg by mouth daily.  [provider]  carboxymethylcellulose (REFRESH PLUS) 0.5 % SOLN Place 1 drop into both eyes daily as needed (for dry eyes).    [provider]  ELIQUIS 5 MG TABS tablet TAKE 1 TABLET(5 MG) BY MOUTH TWICE DAILY 03/11/19   Camnitz, Andree Coss, MD  famotidine (PEPCID) 20 MG tablet Take 1 tablet (20 mg total) by mouth 2 (two) times daily. 05/19/15   Garlon Hatchet, PA-C  meclizine (ANTIVERT) 25 MG tablet Take 1 tablet (25 mg total) by mouth 2 (two) times daily. 11/07/18   Harlene Salts A, PA-C  ondansetron (ZOFRAN) 4 MG tablet Take 1 tablet (4 mg total) by mouth every 8 (eight) hours as needed for nausea or vomiting. 08/26/18   Arthor Captain,  PA-C  rosuvastatin (CRESTOR) 40 MG tablet Take 1 tablet (40 mg total) by mouth daily at 6 PM. 07/31/18   Yvette Rack, MD    Allergies    Eggs or egg-derived products, Orange fruit [citrus], Prednisone, Sulfa antibiotics, Tomato, Influenza vaccines, Aspartame and phenylalanine, Caffeine, and Shellfish allergy  Review of Systems   Review of Systems  All other systems reviewed and are negative.   Physical Exam Updated Vital Signs BP (!) 154/58   Pulse (!) 58   Resp 18   Ht 1.6 m (5\' 3" )   Wt 77.1 kg   SpO2 100%   BMI 30.11 kg/m   Physical Exam Vitals and nursing note reviewed.  Constitutional:      Appearance: Normal appearance. She is normal weight.  HENT:     Head: Normocephalic.     Right Ear: External ear normal.     Left Ear: External ear normal.     Nose: Nose normal.     Comments: No active bleeding is noted the mucous membranes are clear    Mouth/Throat:     Mouth: Mucous membranes are moist.  Eyes:     Pupils: Pupils are equal, round, and reactive to light.  Cardiovascular:     Rate and Rhythm: Normal rate and regular rhythm.     Pulses: Normal pulses.     Heart sounds: Normal heart sounds.  Pulmonary:     Effort: Pulmonary effort is normal.     Breath sounds: Normal breath sounds.  Abdominal:     General: Abdomen is flat. Bowel sounds are normal.     Palpations: Abdomen is soft.  Musculoskeletal:        General: Normal range of motion.     Cervical back: Normal range of motion.  Skin:    General: Skin is warm and dry.     Capillary Refill: Capillary refill takes less than 2 seconds.  Neurological:     General: No focal deficit present.     Mental Status: She is alert and oriented to person, place, and time.  Psychiatric:        Mood and Affect: Mood normal.     ED Results / Procedures / Treatments   Labs (all labs ordered are listed, but only abnormal results are displayed) Labs Reviewed - No data to display  EKG None  Radiology No results  found.  Procedures Procedures (including critical care time)  Medications Ordered in ED Medications - No data to display  ED Course  I have reviewed the triage vital signs and the nursing notes.  Pertinent labs & imaging results that were available during my care of the patient were reviewed by me and considered in my medical decision making (see chart  for details).    MDM Rules/Calculators/A&P                     73 year old female on Eliquis who presents today complaining of some blood with blowing her nose.  No active bleeding here.  Labs checked and final hemoglobin and platelets normal.  Discussed results with patient.  We have discussed return precautions and ways to control nosebleed.  He is scheduled for follow-up with her primary care doctor and she is scheduled for an outpatient colonoscopy due to observing some rectal bleeding.  She is not concerned about that today and has not noted any new rectal bleeding.  Clinical Impression(s) / ED Diagnoses Final diagnoses:  None    Rx / DC Orders ED Discharge Orders    None       Pattricia Boss, MD 04/16/19 1821

## 2019-04-20 ENCOUNTER — Ambulatory Visit (INDEPENDENT_AMBULATORY_CARE_PROVIDER_SITE_OTHER): Payer: Medicare Other | Admitting: *Deleted

## 2019-04-20 DIAGNOSIS — I63412 Cerebral infarction due to embolism of left middle cerebral artery: Secondary | ICD-10-CM

## 2019-04-20 LAB — CUP PACEART REMOTE DEVICE CHECK
Date Time Interrogation Session: 20201223154117
Implantable Pulse Generator Implant Date: 20200403

## 2019-05-23 ENCOUNTER — Ambulatory Visit (INDEPENDENT_AMBULATORY_CARE_PROVIDER_SITE_OTHER): Payer: Medicare Other | Admitting: *Deleted

## 2019-05-23 DIAGNOSIS — I63412 Cerebral infarction due to embolism of left middle cerebral artery: Secondary | ICD-10-CM

## 2019-05-23 LAB — CUP PACEART REMOTE DEVICE CHECK
Date Time Interrogation Session: 20210125154632
Implantable Pulse Generator Implant Date: 20200403

## 2019-05-31 ENCOUNTER — Ambulatory Visit (HOSPITAL_COMMUNITY): Payer: Medicare Other | Admitting: Physician Assistant

## 2019-06-02 ENCOUNTER — Ambulatory Visit: Payer: Medicare Other | Attending: Internal Medicine

## 2019-06-02 DIAGNOSIS — Z23 Encounter for immunization: Secondary | ICD-10-CM

## 2019-06-02 NOTE — Progress Notes (Signed)
   Covid-19 Vaccination Clinic  Name:  Jasmine Dawson    MRN: [id number] DOB: 10-Oct-1945  06/02/2019  [name] was observed post Covid-19 immunization for 15 minutes without incidence. She was provided with Vaccine Information Sheet and instruction to access the V-Safe system.   [name] was instructed to call 911 with any severe reactions post vaccine: [name] Difficulty breathing  . Swelling of your face and throat  . A fast heartbeat  . A bad rash all over your body  . Dizziness and weakness    Immunizations Administered    Name Date Dose VIS Date Route   Moderna COVID-19 Vaccine 06/02/2019 12:18 PM 0.5 mL 03/29/2019 Intramuscular   Manufacturer: Moderna   Lot: [id number]   NDC: 93716-967-89

## 2019-06-23 ENCOUNTER — Ambulatory Visit (INDEPENDENT_AMBULATORY_CARE_PROVIDER_SITE_OTHER): Payer: Medicare Other | Admitting: *Deleted

## 2019-06-23 DIAGNOSIS — I63412 Cerebral infarction due to embolism of left middle cerebral artery: Secondary | ICD-10-CM | POA: Diagnosis not present

## 2019-06-23 LAB — CUP PACEART REMOTE DEVICE CHECK
Date Time Interrogation Session: 20210225155222
Implantable Pulse Generator Implant Date: 20200403

## 2019-06-24 NOTE — Progress Notes (Signed)
ILR Remote 

## 2019-06-27 IMAGING — CT CT ANGIO CHEST
2 of 9 series · 18 of 46 positions shown · IV contrast (iopamidol)
Comparison: Chest x-ray 10/31/2017, CT abdomen pelvis 09/24/2016,
CT 05/18/2015

CLINICAL DATA: Chest pain with elevated D-dimer

EXAM:
CT ANGIOGRAPHY CHEST WITH CONTRAST
TECHNIQUE: Multidetector CT imaging of the chest was performed using the
standard protocol during bolus administration of intravenous
contrast. Multiplanar CT image reconstructions and MIPs were
obtained to evaluate the vascular anatomy.
CONTRAST:  100mL [id number] IOPAMIDOL ([id number]) INJECTION 76%

[Series 7: thins · axial · 0.86mm/px · z∈[+1132,+1375]mm · 15 of 275 slices shown]
[im 16/275  lung]
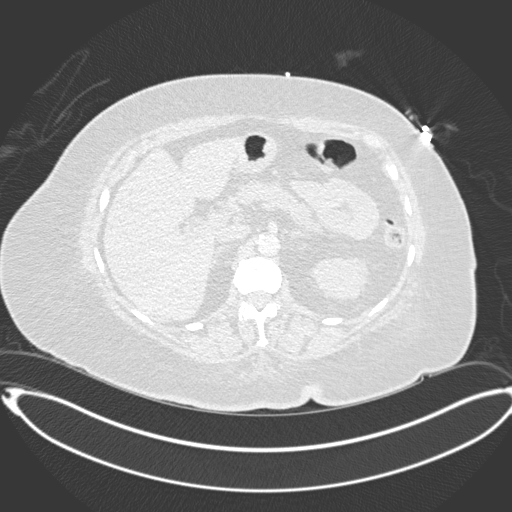
[im 31/275  soft-tissue]
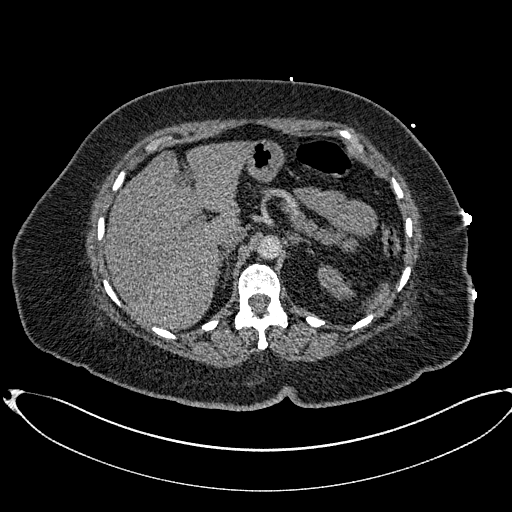
[im 46/275  lung]
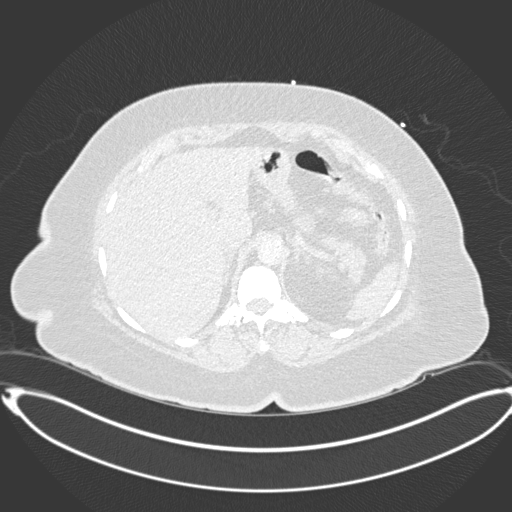
[im 61/275  soft-tissue]
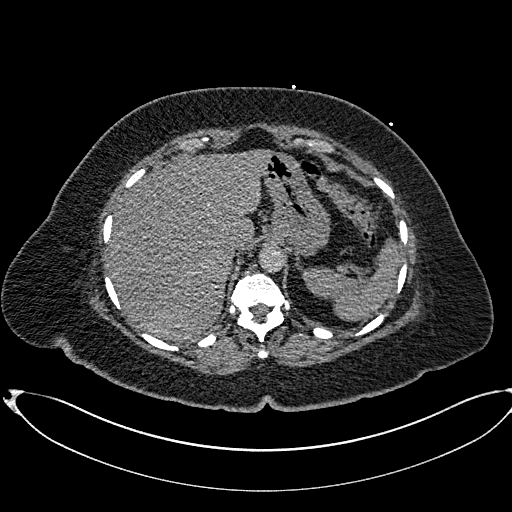
[im 92/275  lung]
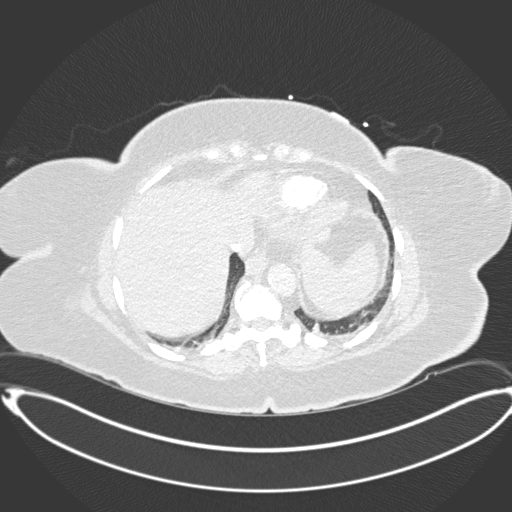
[im 107/275  soft-tissue]
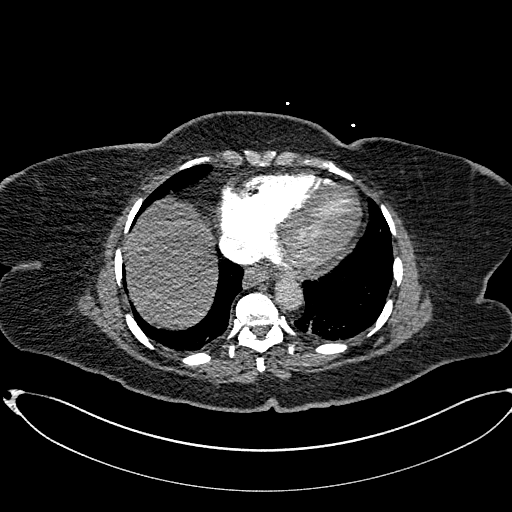
[im 122/275  lung]
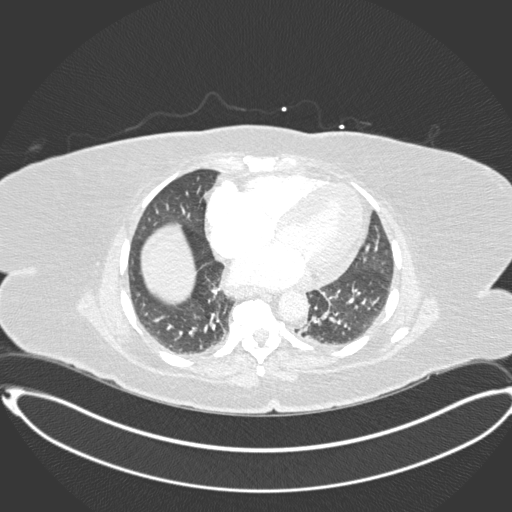
[im 138/275  soft-tissue]
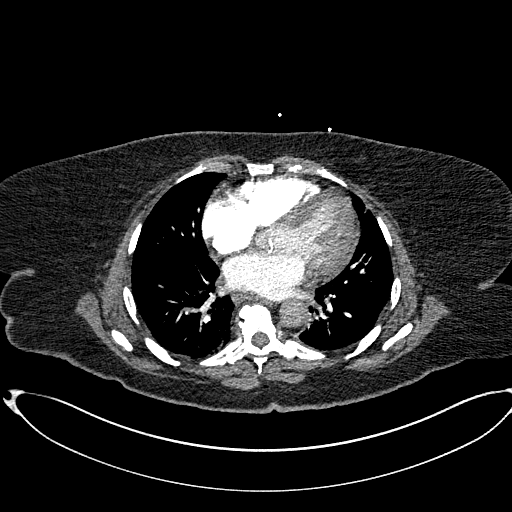
[im 153/275  lung]
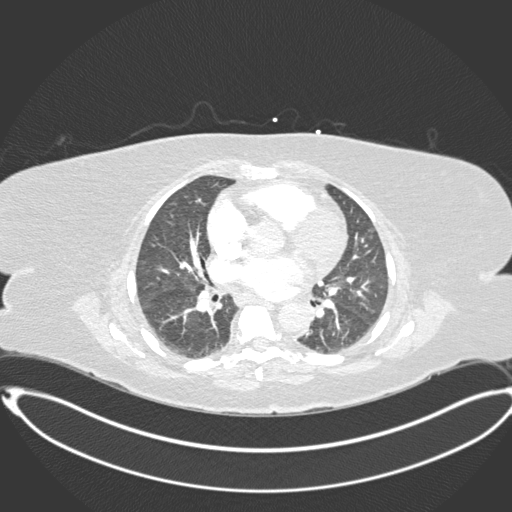
[im 168/275  soft-tissue]
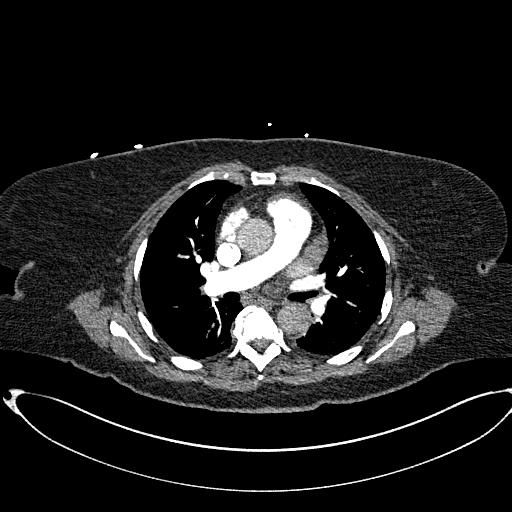
[im 183/275  lung]
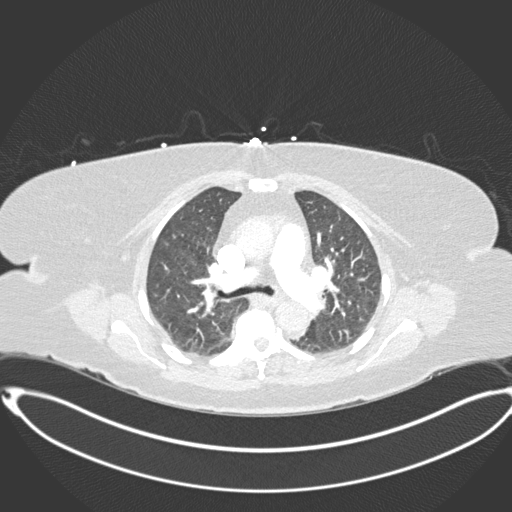
[im 214/275  soft-tissue]
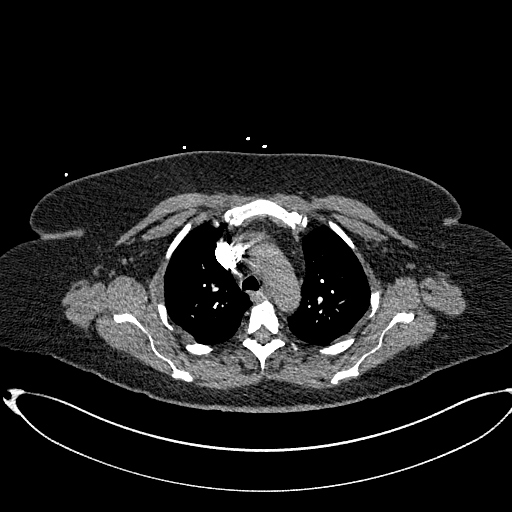
[im 229/275  lung]
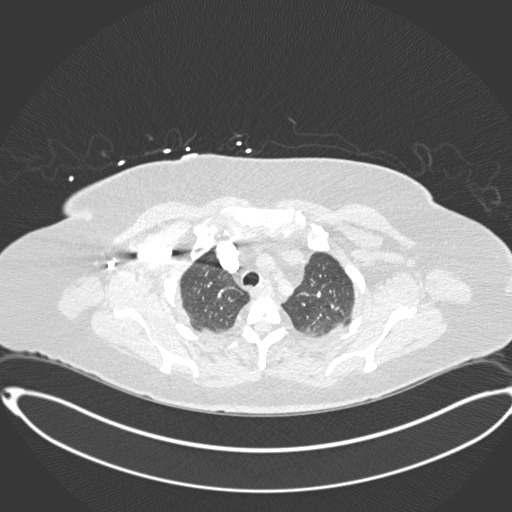
[im 244/275  soft-tissue]
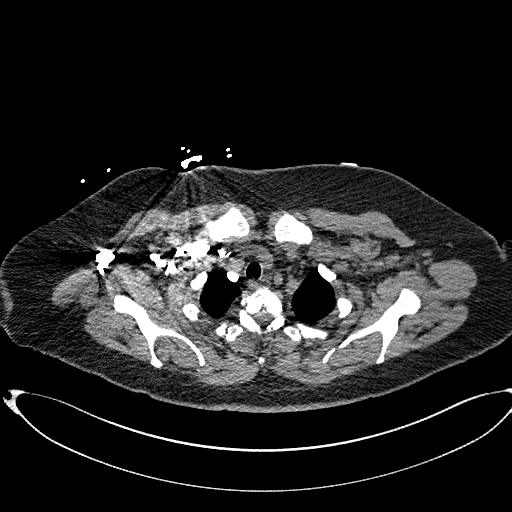
[im 259/275  lung]
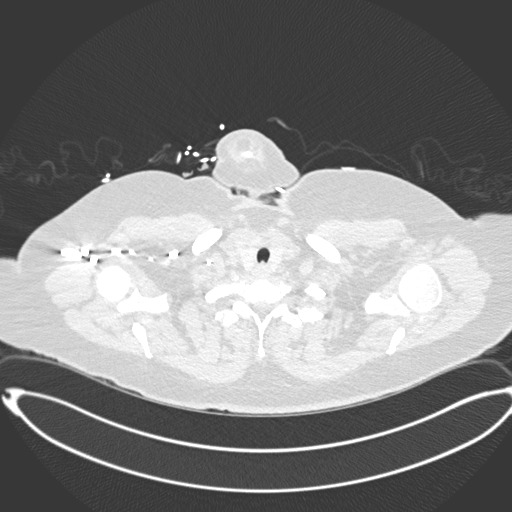

[Series 9: coronal mpr · coronal · 0.53mm/px · 3 of 146 slices shown]
[im 37/146  soft-tissue]
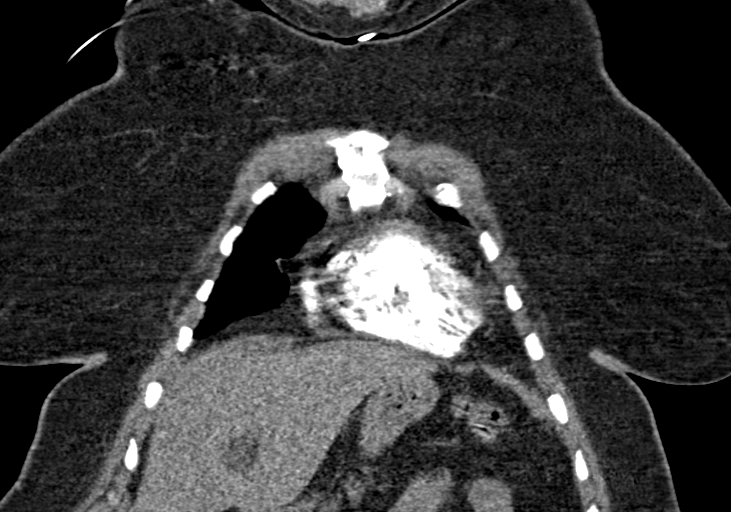
[im 73/146  soft-tissue]
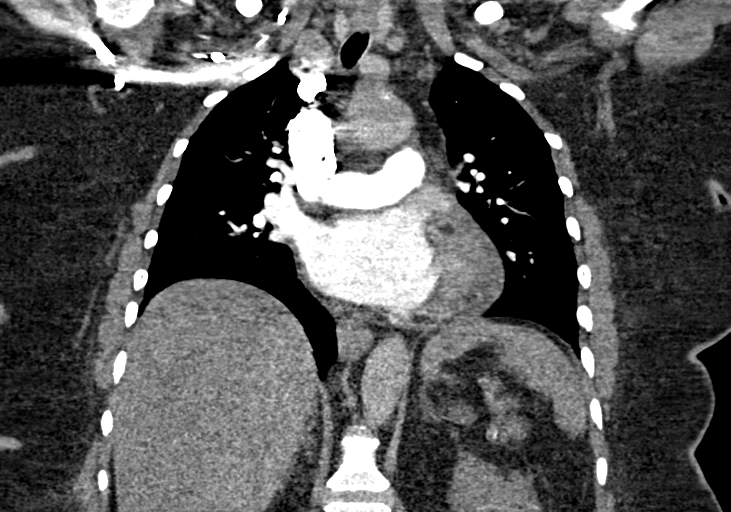
[im 109/146  soft-tissue]
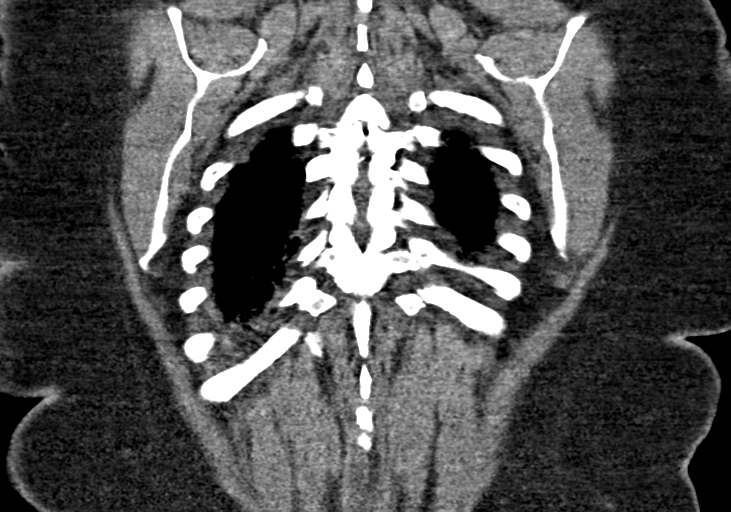

[18 of 46 positions shown; findings below may reference images not displayed]

FINDINGS: Cardiovascular: Satisfactory opacification of the pulmonary arteries
to the segmental level. No evidence of pulmonary embolism. Mild
cardiomegaly. Trace pericardial effusion. Mild aortic
atherosclerosis. No aneurysmal dilatation. Scattered coronary artery
calcification.

Mediastinum/Nodes: Midline trachea. No thyroid mass. No significant
adenopathy. Small hiatal hernia.

Lungs/Pleura: Lungs are clear. No pleural effusion or pneumothorax.

Upper Abdomen: No acute abnormality.

Musculoskeletal: Bones appear diffusely sclerotic but without acute
change.

Review of the MIP images confirms the above findings.
IMPRESSION: 1. Negative for acute pulmonary embolus.
2. Mild cardiomegaly with trace pleural effusion
3. Clear lung fields

Aortic Atherosclerosis ([id number]).

## 2019-07-04 ENCOUNTER — Ambulatory Visit: Payer: Medicare Other | Attending: Internal Medicine

## 2019-07-04 DIAGNOSIS — Z23 Encounter for immunization: Secondary | ICD-10-CM | POA: Insufficient documentation

## 2019-07-13 ENCOUNTER — Other Ambulatory Visit: Payer: Self-pay | Admitting: Physician Assistant

## 2019-07-13 NOTE — Telephone Encounter (Signed)
Per last virtual visit with Ronie Spies, pt reported that she is only taking atenolol 50 mg qd not 50 mg bid as requested and it is noted below that she was to continue on this dose as reported. I have reviewed all encounters after this visit and do not see that her dose was adjusted therefore I will send in rx as reflected on current med list.     1.Essential HTN - BP controlled today. Will continue atenolol 50mg  daily for now as above. She states she is not on the newly rx'd clonidine and never was. Given her bradycardia in the hospital I don't think this is a great choice when used with atenolol. She typically has been checking her BP closer to when she takes her meds. I asked her to check it mid-day the next few days and call with readings. Amlodipine at low dose might be a good choice to augment her therapy if needed. I also suspect her BP will improve as she continues to lose weight as well. She seems motivated at this.

## 2019-07-25 ENCOUNTER — Ambulatory Visit (INDEPENDENT_AMBULATORY_CARE_PROVIDER_SITE_OTHER): Payer: Medicare Other | Admitting: *Deleted

## 2019-07-25 DIAGNOSIS — I63412 Cerebral infarction due to embolism of left middle cerebral artery: Secondary | ICD-10-CM | POA: Diagnosis not present

## 2019-07-25 LAB — CUP PACEART REMOTE DEVICE CHECK
Date Time Interrogation Session: 20210328172742
Implantable Pulse Generator Implant Date: 20200403

## 2019-07-25 NOTE — Progress Notes (Signed)
ILR Remote 

## 2019-07-28 ENCOUNTER — Ambulatory Visit (HOSPITAL_COMMUNITY): Payer: Medicare Other | Admitting: Physician Assistant

## 2019-08-08 ENCOUNTER — Other Ambulatory Visit: Payer: Self-pay | Admitting: Physician Assistant

## 2019-08-22 ENCOUNTER — Encounter: Payer: Self-pay | Admitting: Physician Assistant

## 2019-08-22 NOTE — Progress Notes (Signed)
Cardiology Office Note    Date:  08/25/2019   ID:  Jasmine Dawson Sep 24, 1945, MRN [id number]  PCP:  Leilani Able, MD  Cardiologist:  Lance Muss, MD  Electrophysiologist:  Regan Lemming, MD/Afib clinic  Chief Complaint: f/u atrial fib, pulmonary HTN  History of Present Illness:   Jasmine Dawson is a 75 y.o. female with history of paroxysmal SVT, occasional PVCs, HTN, HLD, CKD III, prior tricuspid regurgitation and pulmonary HTN, prothrombin gene mutation, heterozygous factor V Leiden, family history of clotting, stroke, and PAF who presents for routine follow-up.  Jasmine Dawson has a longstanding history of paroxysmal SVT. Typically this resolves in a few minutes and responds to Valsalva maneuver. In 10/2017 she was readmitted with an episode that persisted. 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 Bystolic was changed to metoprolol. 2D echo 11/01/17 showed EF 65-70%, grade 1 DD, mod-severe TR, moderately increased PASP . Her labs had shown Hgb 11.8, Cr 1.54, K 4.1, TSH wnl, troponins negative, d-dimer was high in 9.18. CT angio 10/31/17 had shown no PE, + mild cardiomegaly with trace pleural effusions. She had breakthrough SVT on metoprolol so this was changed to atenolol. Regarding her prior tricuspid regurgitation, pulmonary HTN, and family history of clotting, I had reviewed with DOD and we proceeded with repeat echo and hypercoag workup. Repeat echo interestingly had shown only mild TR and lower PA pressure so CTEPH was felt less likely. 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 I referred her to hematology. At that time she was not felt to require anticoagulation unless she developed a blood clot. Aggressive DVT prophylaxis was recommended. She was admitted early 07/2018 with left face tingling and right hand weakness and found to have left frontal parietal/MCA infarct  felt embolic. 2D echo 07/30/18 showed EF 60-65%, impaired diastolic relaxation, trivial TR. LE venous duplex was negative. TEE was deferred due to Covid-19. TCD was unremarkable. She underwent loop recorder insertion 07/30/18 by the EP team which ultimately demonstrated episodes of atrial fibrillation. Plavix was stopped and she was changed to Eliquis. Last labs personally reviewed 03/2019 with K 3.4, Cr 1.89, Hgb 12.4, 12/2018 LFTS ok, 07/2018 LDL 161, 2019 TSH wnl.  She is seen back for follow-up and is clinically stable without any palpitations, CP, dyspnea, syncope or bleeding. She had a birthday recently but she reports it was a sad one because she also recently lost her son unexpectedly to an acute asthma attack. She shares the memory that he never wanted her to stress about cooking for family events so would always arrange for this to be catered. She says she thought her primary care recently suggested to change atenolol to metoprolol. She's unsure why and denies any problems on this medicine. Home BPs run 118-120s/70s.    Past Medical History:  Diagnosis Date   CKD (chronic kidney disease), stage III    Heterozygous factor V Leiden mutation (HCC)    Hyperlipidemia    Hypertension    Motion sickness    PAF (paroxysmal atrial fibrillation) (HCC)    Prothrombin gene mutation (HCC)    Pulmonary hypertension (HCC)    a. 2D echo 11/01/17 showed EF 65-70%, grade 1 DD, mod-severe TR, moderately increased PASP . b. F/u Echo 07/2018 not seen.   PVC's (premature ventricular contractions)    occasional PVCs   Sinus bradycardia    a. requiring reduction in beta blocker in  2020.   Stroke (cerebrum) (HCC)    SVT (supraventricular tachycardia) (HCC)    Tricuspid regurgitation     Past Surgical History:  Procedure Laterality Date   HYSTEROSCOPY WITH D & C  11/07/2011   Procedure: DILATATION AND CURETTAGE /HYSTEROSCOPY;  Surgeon: Lahoma Crocker, MD;  Location: [facility];  Service:  Gynecology;  Laterality: N/A;  endometrial polypectomy   LOOP RECORDER INSERTION N/A 07/30/2018   Procedure: LOOP RECORDER INSERTION;  Surgeon: Constance Haw, MD;  Location: [facility];  Service: Cardiovascular;  Laterality: N/A;   TUBAL LIGATION  1977    Current Medications: Current Meds  Medication Sig   acetaminophen (TYLENOL) 650 MG CR tablet Take 650 mg by mouth every 8 (eight) hours as needed for pain.   atenolol (TENORMIN) 50 MG tablet Take 1 tablet (50 mg total) by mouth daily. Please keep upcoming appt in April for future refills. Thank you   carboxymethylcellulose (REFRESH PLUS) 0.5 % SOLN Place 1 drop into both eyes daily as needed (for dry eyes).   ELIQUIS 5 MG TABS tablet TAKE 1 TABLET(5 MG) BY MOUTH TWICE DAILY   famotidine (PEPCID) 20 MG tablet Take 20 mg by mouth daily.   meclizine (ANTIVERT) 25 MG tablet Take 25 mg by mouth as needed for dizziness.   ondansetron (ZOFRAN) 4 MG tablet Take 1 tablet (4 mg total) by mouth every 8 (eight) hours as needed for nausea or vomiting.   rosuvastatin (CRESTOR) 40 MG tablet Take 1 tablet (40 mg total) by mouth daily at 6 PM.      Allergies:   Eggs or egg-derived products, Orange fruit [citrus], Prednisone, Sulfa antibiotics, Tomato, Influenza vaccines, Aspartame and phenylalanine, Caffeine, and Shellfish allergy   Social History   Socioeconomic History   Marital status: Married    Spouse name: Not on file   Number of children: Not on file   Years of education: Not on file   Highest education level: Not on file  Occupational History   Not on file  Tobacco Use   Smoking status: Never Smoker   Smokeless tobacco: Never Used  Substance and Sexual Activity   Alcohol use: No   Drug use: No   Sexual activity: Not Currently  Other Topics Concern   Not on file  Social History Narrative   Did daycare as a younger lady.  Retired at [employer] Strain:    Difficulty of Paying Living Expenses:   Food Insecurity:    Worried About Charity fundraiser in the Last Year:    Arboriculturist in the Last Year:   Transportation Needs:    Film/video editor (Medical):    Lack of Transportation (Non-Medical):   Physical Activity:    Days of Exercise per Week:    Minutes of Exercise per Session:   Stress:    Feeling of Stress :   Social Connections:    Frequency of Communication with Friends and Family:    Frequency of Social Gatherings with Friends and Family:    Attends Religious Services:    Active Member of Clubs or Organizations:    Attends Archivist Meetings:    Marital Status:      Family History:  The patient's family history includes Diabetes in her mother; Heart attack in her father and mother.  ROS:  Please see the history of present illness.  All other systems are reviewed and otherwise negative.    EKGs/Labs/Other Studies Reviewed:    Studies reviewed are outlined and summarized above. Reports included below if pertinent.  Last Device Interrogation 06/2019 [facility] summary report received. Battery status OK. Normal device function.  One new AF detection (2 min). Known PAF, on OAC.  No new symptom episodes, tachy episodes, brady, or pause episodes.  Monthly summary reports and ROV/PRN.  Salomon Fick, RN, MSN  2D echo 07/2018 1. The left ventricle has normal systolic function with an ejection  fraction of 60-65%. The cavity size was normal. There is moderately  increased left ventricular wall thickness. Left ventricular diastolic  Doppler parameters are consistent with impaired  relaxation. Indeterminate filling pressures The E/e' is 8-15. No evidence  of left ventricular regional wall motion abnormalities.  2. The right ventricle has normal systolic function. The cavity was  normal. There is no increase in right ventricular wall  thickness.  3. The mitral valve is degenerative. Mild thickening of the mitral valve  leaflet. There is mild mitral annular calcification present.  4. The tricuspid valve is grossly normal.  5. The aortic valve is tricuspid. Mild calcification of the aortic valve.  Aortic valve regurgitation is trivial by color flow Doppler.     EKG:  EKG is not ordered today but personally reviewed from yesterday showing NSR 64bpm no acute STT changes.  Recent Labs: 12/29/2018: ALT 13 04/16/2019: BUN 34; Creatinine, Ser 1.89; Hemoglobin 12.4; Platelets 226; Potassium 3.4; Sodium 136  Recent Lipid Panel    Component Value Date/Time   CHOL 240 (H) 07/29/2018 0553   TRIG 93 07/29/2018 0553   HDL 60 07/29/2018 0553   CHOLHDL 4.0 07/29/2018 0553   VLDL 19 07/29/2018 0553   LDLCALC 161 (H) 07/29/2018 0553    PHYSICAL EXAM:    VS:  BP 136/82    Pulse 64    Ht 5' 3.5" (1.613 m)    Wt 180 lb 12.8 oz (82 kg)    SpO2 99%    BMI 31.52 kg/m   BMI: Body mass index is 31.52 kg/m.  GEN: Well nourished, well developed F, in no acute distress, appears younger than stated age HEENT: normocephalic, atraumatic Neck: no JVD, carotid bruits, or masses Cardiac: RRR; no murmurs, rubs, or gallops, no edema  Respiratory:  clear to auscultation bilaterally, normal work of breathing GI: soft, nontender, nondistended, + BS MS: no deformity or atrophy Skin: warm and dry, no rash Neuro:  Alert and Oriented x 3, Strength and sensation are intact, follows commands Psych: euthymic mood, full affect  Wt Readings from Last 3 Encounters:  08/25/19 180 lb 12.8 oz (82 kg)  08/24/19 181 lb 12.8 oz (82.5 kg)  04/16/19 170 lb (77.1 kg)     ASSESSMENT & PLAN:   1. Paroxysmal atrial fibrillation - saw afib clinic yesterday and felt to be doing well. She is in NSR. Last ILR interrogation showed only 2 minutes of atrial fib. Denies any breakthrough symptoms. Continue Eliquis. We will be obtaining a BMET/Mg today due to recent  hypokalemia. We will review her creatinine clearance to ensure stable for current anticoagulation dose. She also does not appear to have had a TSH since her atrial fib developed - will update today. I will have our office call primary care to find out what prompted suggestion of changing atenolol to metoprolol. Of note, she previously had breakthrough SVT on metoprolol so would likely  plan to keep regimen the same. She is on max dose atenolol for her kidney function. 2. PSVT - quiescent on beta blocker. 3. Essential HTN - she has history of white coat HTN and reports normal BPs at home as above. No changes made today. 4. History of pulmonary HTN and moderate-severe tricuspid regurgitation - unclear what precipitated this finding in the past. Repeat echo in 07/2018 showed only trivial TR with RVSP of . She has no dyspnea. Will continue to follow clinically. Can consider repeat echocardiogram at next OV. 5. Hyperlipidemia - this is managed by primary care. She reports a plan to arrange fasting blood work with them in the upcoming weeks. She is not fasting today so we could not obtain. 6. Hypokalemia - noted on 03/2019 labs. She does note occasional leg cramps. Will obtain BMET/Mg.  Disposition: F/u with Dr. Eldridge Dace in 6 months.   Medication Adjustments/Labs and Tests Ordered: Current medicines are reviewed at length with the patient today.  Concerns regarding medicines are outlined above. Medication changes, Labs and Tests ordered today are summarized above and listed in the Patient Instructions accessible in Encounters.   Signed, Laurann Montana, PA-C  08/25/2019 12:20 PM    [facility] [address] [place], [place], [place]  [address] Phone: [phone]; Fax: [phone]

## 2019-08-24 ENCOUNTER — Ambulatory Visit (HOSPITAL_COMMUNITY)
Admission: RE | Admit: 2019-08-24 | Discharge: 2019-08-24 | Disposition: A | Discharge status: Home / Self Care | Payer: Medicare Other | Source: Ambulatory Visit | Attending: Physician Assistant | Admitting: Physician Assistant

## 2019-08-24 ENCOUNTER — Other Ambulatory Visit: Payer: Self-pay

## 2019-08-24 VITALS — BP 140/70 | HR 64 | Ht 63.0 in | Wt 181.8 lb

## 2019-08-24 DIAGNOSIS — Z6832 Body mass index (BMI) 32.0-32.9, adult: Secondary | ICD-10-CM | POA: Diagnosis not present

## 2019-08-24 DIAGNOSIS — Z91013 Allergy to seafood: Secondary | ICD-10-CM | POA: Insufficient documentation

## 2019-08-24 DIAGNOSIS — Z79899 Other long term (current) drug therapy: Secondary | ICD-10-CM | POA: Insufficient documentation

## 2019-08-24 DIAGNOSIS — D6869 Other thrombophilia: Secondary | ICD-10-CM | POA: Insufficient documentation

## 2019-08-24 DIAGNOSIS — Z882 Allergy status to sulfonamides status: Secondary | ICD-10-CM | POA: Insufficient documentation

## 2019-08-24 DIAGNOSIS — Z8249 Family history of ischemic heart disease and other diseases of the circulatory system: Secondary | ICD-10-CM | POA: Insufficient documentation

## 2019-08-24 DIAGNOSIS — Z91012 Allergy to eggs: Secondary | ICD-10-CM | POA: Insufficient documentation

## 2019-08-24 DIAGNOSIS — Z833 Family history of diabetes mellitus: Secondary | ICD-10-CM | POA: Diagnosis not present

## 2019-08-24 DIAGNOSIS — Z7901 Long term (current) use of anticoagulants: Secondary | ICD-10-CM | POA: Insufficient documentation

## 2019-08-24 DIAGNOSIS — E785 Hyperlipidemia, unspecified: Secondary | ICD-10-CM | POA: Insufficient documentation

## 2019-08-24 DIAGNOSIS — D6851 Activated protein C resistance: Secondary | ICD-10-CM | POA: Diagnosis not present

## 2019-08-24 DIAGNOSIS — I4891 Unspecified atrial fibrillation: Secondary | ICD-10-CM | POA: Diagnosis present

## 2019-08-24 DIAGNOSIS — N183 Chronic kidney disease, stage 3 unspecified: Secondary | ICD-10-CM | POA: Diagnosis not present

## 2019-08-24 DIAGNOSIS — I272 Pulmonary hypertension, unspecified: Secondary | ICD-10-CM | POA: Diagnosis not present

## 2019-08-24 DIAGNOSIS — E669 Obesity, unspecified: Secondary | ICD-10-CM | POA: Insufficient documentation

## 2019-08-24 DIAGNOSIS — I480 Paroxysmal atrial fibrillation: Secondary | ICD-10-CM | POA: Diagnosis not present

## 2019-08-24 DIAGNOSIS — Z888 Allergy status to other drugs, medicaments and biological substances status: Secondary | ICD-10-CM | POA: Diagnosis not present

## 2019-08-24 DIAGNOSIS — I129 Hypertensive chronic kidney disease with stage 1 through stage 4 chronic kidney disease, or unspecified chronic kidney disease: Secondary | ICD-10-CM | POA: Diagnosis not present

## 2019-08-24 DIAGNOSIS — Z8673 Personal history of transient ischemic attack (TIA), and cerebral infarction without residual deficits: Secondary | ICD-10-CM | POA: Diagnosis not present

## 2019-08-24 DIAGNOSIS — Z91018 Allergy to other foods: Secondary | ICD-10-CM | POA: Insufficient documentation

## 2019-08-24 NOTE — Progress Notes (Signed)
Primary Care Physician: Lin Landsman, MD Primary Cardiologist: Dr Irish Lack  Primary Electrophysiologist: Dr Curt Bears Referring Physician: Dr Russ Halo Jasmine Dawson is a 74 y.o. female with a history of HTN, CKD, HLD, pulm HTN, CVA, factor V Leiden, and paroxysmal atrial fibrillation who presents for consultation in the [facility].  The patient was diagnosed with a CVA 07/2018 and an ILR was placed. She was diagnosed with afib on 08/20/18 and was started on Eliquis for a CHADS2VASC score of 6. She has done well from an afib standpoint with a 0% afib burden on ILR. She denies any bleeding issues on anticoagulation. Unfortunately, her son recently passed away suddenly.  Today, she denies symptoms of palpitations, chest pain, shortness of breath, orthopnea, PND, lower extremity edema, dizziness, presyncope, syncope, snoring, daytime somnolence, bleeding, or neurologic sequela. The patient is tolerating medications without difficulties and is otherwise without complaint today.    Atrial Fibrillation Risk Factors:  she does not have symptoms or diagnosis of sleep apnea. she does not have a history of rheumatic fever. she does not have a history of alcohol use. The patient does not have a history of early familial atrial fibrillation or other arrhythmias.  she has a BMI of Body mass index is 32.2 kg/m.[name] Filed Weights   08/24/19 1330  Weight: 82.5 kg    Family History  Problem Relation Age of Onset  . Diabetes Mother        died at 57  . Heart attack Mother   . Heart attack Father        died at 24     Atrial Fibrillation Management history:  Previous antiarrhythmic drugs: none Previous cardioversions: none Previous ablations: none CHADS2VASC score: 6 Anticoagulation history: Eliquis   Past Medical History:  Diagnosis Date  . CKD (chronic kidney disease), stage III   . Heterozygous factor V Leiden mutation ([place])   . Hyperlipidemia   .  Hypertension   . Motion sickness   . PAF (paroxysmal atrial fibrillation) ([place])   . Prothrombin gene mutation ([place])   . Pulmonary hypertension ([place])    a. 2D echo 11/01/17 showed EF 65-70%, grade 1 DD, mod-severe TR, moderately increased PASP 35mmHg. b. F/u Echo 07/2018 not seen.  [name] PVC's (premature ventricular contractions)    occasional PVCs  . Sinus bradycardia    a. requiring reduction in beta blocker in 2020.  [name] Stroke (cerebrum) ([place])   . SVT (supraventricular tachycardia) ([place])   . Tricuspid regurgitation    Past Surgical History:  Procedure Laterality Date  . HYSTEROSCOPY WITH D & C  11/07/2011   Procedure: DILATATION AND CURETTAGE /HYSTEROSCOPY;  Surgeon: Lahoma Crocker, MD;  Location: [facility];  Service: Gynecology;  Laterality: N/A;  endometrial polypectomy  . LOOP RECORDER INSERTION N/A 07/30/2018   Procedure: LOOP RECORDER INSERTION;  Surgeon: Constance Haw, MD;  Location: [facility];  Service: Cardiovascular;  Laterality: N/A;  . TUBAL LIGATION  1977    Current Outpatient Medications  Medication Sig Dispense Refill  . acetaminophen (TYLENOL) 650 MG CR tablet Take 650 mg by mouth every 8 (eight) hours as needed for pain.    [name] atenolol (TENORMIN) 50 MG tablet Take 1 tablet (50 mg total) by mouth daily. Please keep upcoming appt in April for future refills. Thank you 30 tablet 0  . carboxymethylcellulose (REFRESH PLUS) 0.5 % SOLN Place 1 drop into both eyes daily as needed (for dry eyes).    [name]  ELIQUIS 5 MG TABS tablet TAKE 1 TABLET(5 MG) BY MOUTH TWICE DAILY 60 tablet 7  . famotidine (PEPCID) 20 MG tablet Take 1 tablet (20 mg total) by mouth 2 (two) times daily. 30 tablet 0  . meclizine (ANTIVERT) 25 MG tablet Take 1 tablet (25 mg total) by mouth 2 (two) times daily. 30 tablet 0  . ondansetron (ZOFRAN) 4 MG tablet Take 1 tablet (4 mg total) by mouth every 8 (eight) hours as needed for nausea or vomiting. 10 tablet 0  . rosuvastatin (CRESTOR) 40 MG tablet Take 1  tablet (40 mg total) by mouth daily at 6 PM. 30 tablet 3   No current facility-administered medications for this encounter.    Allergies  Allergen Reactions  . Eggs Or Egg-Derived Products Anaphylaxis and Swelling  . Orange Fruit [Citrus] Anaphylaxis and Swelling  . Prednisone Anaphylaxis, Hives and Itching  . Sulfa Antibiotics Anaphylaxis, Hives and Itching  . Tomato Anaphylaxis and Swelling  . Influenza Vaccines Other (See Comments)    unknown  . Aspartame And Phenylalanine Palpitations  . Caffeine Palpitations  . Shellfish Allergy Itching, Nausea And Vomiting and Rash    Social History   Socioeconomic History  . Marital status: Married    Spouse name: Not on file  . Number of children: Not on file  . Years of education: Not on file  . Highest education level: Not on file  Occupational History  . Not on file  Tobacco Use  . Smoking status: Never Smoker  . Smokeless tobacco: Never Used  Substance and Sexual Activity  . Alcohol use: No  . Drug use: No  . Sexual activity: Not Currently  Other Topics Concern  . Not on file  Social History Narrative   Did daycare as a younger lady.  Retired at [employer] [phone]             Social Determinants of Corporate investment banker Strain:   . Difficulty of Paying Living Expenses:   Food Insecurity:   . Worried About Programme researcher, broadcasting/film/video in the Last Year:   . Barista in the Last Year:   Transportation Needs:   . Freight forwarder (Medical):   [name] Lack of Transportation (Non-Medical):   Physical Activity:   . Days of Exercise per Week:   . Minutes of Exercise per Session:   Stress:   . Feeling of Stress :   Social Connections:   . Frequency of Communication with Friends and Family:   . Frequency of Social Gatherings with Friends and Family:   . Attends Religious Services:   . Active Member of Clubs or Organizations:   . Attends Banker Meetings:   [name] Marital Status:    Intimate Partner Violence:   . Fear of Current or Ex-Partner:   . Emotionally Abused:   [name] Physically Abused:   . Sexually Abused:      ROS- All systems are reviewed and negative except as per the HPI above.  Physical Exam: Vitals:   08/24/19 1330  BP: 140/70  Pulse: 64  Weight: 82.5 kg  Height: 5\' 3"  (1.6 m)    GEN- The patient is well appearing obese elderly female, alert and oriented x 3 today.   Head- normocephalic, atraumatic Eyes-  Sclera clear, conjunctiva pink Ears- hearing intact Oropharynx- clear Neck- supple  Lungs- Clear to ausculation bilaterally, normal work of breathing Heart- Regular rate and rhythm, no murmurs, rubs or gallops  GI- soft, NT, ND, + BS Extremities- no clubbing, cyanosis, or edema MS- no significant deformity or atrophy Skin- no rash or lesion Psych- euthymic mood, full affect Neuro- strength and sensation are intact  Wt Readings from Last 3 Encounters:  08/24/19 82.5 kg  04/16/19 77.1 kg  08/26/18 83.9 kg    EKG today demonstrates SR HR 64, PR 194, QRS 94, QTc 427  Echo 07/29/18 demonstrated  1. The left ventricle has normal systolic function with an ejection  fraction of 60-65%. The cavity size was normal. There is moderately  increased left ventricular wall thickness. Left ventricular diastolic  Doppler parameters are consistent with impaired  relaxation. Indeterminate filling pressures The E/e' is 8-15. No evidence  of left ventricular regional wall motion abnormalities.  2. The right ventricle has normal systolic function. The cavity was  normal. There is no increase in right ventricular wall thickness.  3. The mitral valve is degenerative. Mild thickening of the mitral valve  leaflet. There is mild mitral annular calcification present.  4. The tricuspid valve is grossly normal.  5. The aortic valve is tricuspid. Mild calcification of the aortic valve.  Aortic valve regurgitation is trivial by color flow Doppler.    SUMMARY    LVEF 60-65%, moderate LVH, normal wall motion, grade 1 DD,  indeterminate LV filling pressure, mild aortic valve calcification  with trivial AI, normal biatrial size, trivial MR, mild TR, RVSP 38  mmHg, normal IVC, negative for PFO by color doppler   Epic records are reviewed at length today  CHA2DS2-VASc Score = 6 The patient's score is based upon: CHF History: No HTN History: Yes Age : 33 + Diabetes History: No Stroke History: Yes Vascular Disease History: No Gender: Female      ASSESSMENT AND PLAN: 1. Paroxysmal Atrial Fibrillation (ICD10:  I48.0) The patient's CHA2DS2-VASc score is 6, indicating a 9.7% annual risk of stroke.   Patient appears to be maintaining SR with 0% afib burden on ILR. Continue atenolol 50 mg daily Continue Eliquis 5 mg BID  2. Secondary Hypercoagulable State (ICD10:  D68.69) The patient is at significant risk for stroke/thromboembolism based upon her CHA2DS2-VASc Score of 6.  Continue Apixaban (Eliquis).   3. Obesity Body mass index is 32.2 kg/m. Lifestyle modification was discussed at length including regular exercise and weight reduction.  4. HTN Stable, no changes today.   Follow up in the AF clinic in 6 months.    Jorja Loa PA-C [facility] [address] [place], [place] [address] [phone] 08/24/2019 2:09 PM

## 2019-08-25 ENCOUNTER — Encounter: Payer: Self-pay | Admitting: Physician Assistant

## 2019-08-25 ENCOUNTER — Ambulatory Visit (INDEPENDENT_AMBULATORY_CARE_PROVIDER_SITE_OTHER): Payer: Medicare Other | Admitting: Physician Assistant

## 2019-08-25 VITALS — BP 136/82 | HR 64 | Ht 63.5 in | Wt 180.8 lb

## 2019-08-25 DIAGNOSIS — I63412 Cerebral infarction due to embolism of left middle cerebral artery: Secondary | ICD-10-CM | POA: Diagnosis not present

## 2019-08-25 DIAGNOSIS — E876 Hypokalemia: Secondary | ICD-10-CM

## 2019-08-25 DIAGNOSIS — E785 Hyperlipidemia, unspecified: Secondary | ICD-10-CM

## 2019-08-25 DIAGNOSIS — I10 Essential (primary) hypertension: Secondary | ICD-10-CM

## 2019-08-25 DIAGNOSIS — I480 Paroxysmal atrial fibrillation: Secondary | ICD-10-CM | POA: Diagnosis not present

## 2019-08-25 DIAGNOSIS — I471 Supraventricular tachycardia: Secondary | ICD-10-CM

## 2019-08-25 DIAGNOSIS — Z8679 Personal history of other diseases of the circulatory system: Secondary | ICD-10-CM

## 2019-08-25 LAB — BASIC METABOLIC PANEL
BUN/Creatinine Ratio: 19 (ref 12–28)
BUN: 31 mg/dL — ABNORMAL HIGH (ref 8–27)
CO2: 24 mmol/L (ref 20–29)
Calcium: 9.7 mg/dL (ref 8.7–10.3)
Chloride: 101 mmol/L (ref 96–106)
Creatinine, Ser: 1.66 mg/dL — ABNORMAL HIGH (ref 0.57–1.00)
GFR calc Af Amer: 34 mL/min/{1.73_m2} — ABNORMAL LOW (ref >59)
GFR calc non Af Amer: 30 mL/min/{1.73_m2} — ABNORMAL LOW (ref >59)
Glucose: 89 mg/dL (ref 65–99)
Potassium: 4.9 mmol/L (ref 3.5–5.2)
Sodium: 138 mmol/L (ref 134–144)

## 2019-08-25 LAB — MAGNESIUM: Magnesium: 2.3 mg/dL (ref 1.6–2.3)

## 2019-08-25 LAB — CUP PACEART REMOTE DEVICE CHECK
Date Time Interrogation Session: 20210428230915
Implantable Pulse Generator Implant Date: 20200403

## 2019-08-25 LAB — TSH: TSH: 1.92 u[IU]/mL (ref 0.450–4.500)

## 2019-08-25 NOTE — Patient Instructions (Addendum)
Medication Instructions:  Your physician recommends that you continue on your current medications as directed. Please refer to the Current Medication list given to you today.  *If you need a refill on your cardiac medications before your next appointment, please call your pharmacy*   Lab Work: TODAY:  BMET, MAG, & TSH  If you have labs (blood work) drawn today and your tests are completely normal, you will receive your results only by: [name] MyChart Message (if you have MyChart) OR . A paper copy in the mail If you have any lab test that is abnormal or we need to change your treatment, we will call you to review the results.   Testing/Procedures: None ordered   Follow-Up: At [facility], you and your health needs are our priority.  As part of our continuing mission to provide you with exceptional heart care, we have created designated Provider Care Teams.  These Care Teams include your primary Cardiologist (physician) and Advanced Practice Providers (APPs -  Physician Assistants and Nurse Practitioners) who all work together to provide you with the care you need, when you need it.  We recommend signing up for the patient portal called "MyChart".  Sign up information is provided on this After Visit Summary.  MyChart is used to connect with patients for Virtual Visits (Telemedicine).  Patients are able to view lab/test results, encounter notes, upcoming appointments, etc.  Non-urgent messages can be sent to your provider as well.   To learn more about what you can do with MyChart, go to [email or web address].    Your next appointment:   6 month(s)  The format for your next appointment:   In Person  Provider:   You may see Lance Muss, MD or one of the following Advanced Practice Providers on your designated Care Team:    Ronie Spies, PA-C  Jacolyn Reedy, PA-C    Other Instructions

## 2019-08-29 ENCOUNTER — Ambulatory Visit (INDEPENDENT_AMBULATORY_CARE_PROVIDER_SITE_OTHER): Payer: Medicare Other | Admitting: *Deleted

## 2019-08-29 DIAGNOSIS — I63412 Cerebral infarction due to embolism of left middle cerebral artery: Secondary | ICD-10-CM | POA: Diagnosis not present

## 2019-08-30 NOTE — Progress Notes (Signed)
Carelink Summary Report / Loop Recorder 

## 2019-09-06 ENCOUNTER — Other Ambulatory Visit: Payer: Self-pay | Admitting: Physician Assistant

## 2019-09-27 ENCOUNTER — Ambulatory Visit (INDEPENDENT_AMBULATORY_CARE_PROVIDER_SITE_OTHER): Payer: Medicare Other | Admitting: *Deleted

## 2019-09-27 DIAGNOSIS — I63412 Cerebral infarction due to embolism of left middle cerebral artery: Secondary | ICD-10-CM

## 2019-09-28 DIAGNOSIS — I63412 Cerebral infarction due to embolism of left middle cerebral artery: Secondary | ICD-10-CM

## 2019-09-28 LAB — CUP PACEART REMOTE DEVICE CHECK
Date Time Interrogation Session: 20210529232234
Implantable Pulse Generator Implant Date: 20200403

## 2019-09-28 NOTE — Progress Notes (Signed)
Carelink Summary Report / Loop Recorder 

## 2019-10-28 ENCOUNTER — Ambulatory Visit (INDEPENDENT_AMBULATORY_CARE_PROVIDER_SITE_OTHER): Payer: Medicare Other | Admitting: *Deleted

## 2019-10-28 DIAGNOSIS — I480 Paroxysmal atrial fibrillation: Secondary | ICD-10-CM

## 2019-10-28 LAB — CUP PACEART REMOTE DEVICE CHECK
Date Time Interrogation Session: 20210701232144
Implantable Pulse Generator Implant Date: 20200403

## 2019-11-01 NOTE — Progress Notes (Signed)
Carelink Summary Report / Loop Recorder 

## 2019-11-26 ENCOUNTER — Other Ambulatory Visit: Payer: Self-pay | Admitting: Cardiology

## 2019-11-28 NOTE — Telephone Encounter (Signed)
Prescription refill request for Eliquis received.  Last office visit: [place], 08/24/2019 Scr: 1.66, 08/25/2019 Age: 74 Weight: 82 kg   Prescription refill sent.

## 2019-12-03 LAB — CUP PACEART REMOTE DEVICE CHECK
Date Time Interrogation Session: 20210803232023
Implantable Pulse Generator Implant Date: 20200403

## 2019-12-05 ENCOUNTER — Ambulatory Visit (INDEPENDENT_AMBULATORY_CARE_PROVIDER_SITE_OTHER): Payer: Medicare Other | Admitting: *Deleted

## 2019-12-05 DIAGNOSIS — I63412 Cerebral infarction due to embolism of left middle cerebral artery: Secondary | ICD-10-CM

## 2019-12-06 NOTE — Progress Notes (Signed)
Carelink Summary Report / Loop Recorder 

## 2020-01-04 LAB — CUP PACEART REMOTE DEVICE CHECK
Date Time Interrogation Session: 20210905234034
Implantable Pulse Generator Implant Date: 20200403

## 2020-01-09 ENCOUNTER — Ambulatory Visit (INDEPENDENT_AMBULATORY_CARE_PROVIDER_SITE_OTHER): Payer: Medicare Other | Admitting: *Deleted

## 2020-01-09 DIAGNOSIS — I480 Paroxysmal atrial fibrillation: Secondary | ICD-10-CM

## 2020-01-10 NOTE — Progress Notes (Signed)
Carelink Summary Report / Loop Recorder 

## 2020-02-04 LAB — CUP PACEART REMOTE DEVICE CHECK
Date Time Interrogation Session: 20211008234257
Implantable Pulse Generator Implant Date: 20200403

## 2020-02-13 ENCOUNTER — Ambulatory Visit (INDEPENDENT_AMBULATORY_CARE_PROVIDER_SITE_OTHER): Payer: Medicare Other

## 2020-02-13 DIAGNOSIS — I63412 Cerebral infarction due to embolism of left middle cerebral artery: Secondary | ICD-10-CM | POA: Diagnosis not present

## 2020-02-17 NOTE — Progress Notes (Signed)
Carelink Summary Report / Loop Recorder 

## 2020-03-19 ENCOUNTER — Ambulatory Visit (INDEPENDENT_AMBULATORY_CARE_PROVIDER_SITE_OTHER): Payer: Medicare Other

## 2020-03-19 DIAGNOSIS — I63412 Cerebral infarction due to embolism of left middle cerebral artery: Secondary | ICD-10-CM

## 2020-03-19 LAB — CUP PACEART REMOTE DEVICE CHECK
Date Time Interrogation Session: 20211121231908
Implantable Pulse Generator Implant Date: 20200403

## 2020-03-24 NOTE — Progress Notes (Signed)
Virtual Visit via Telephone Note   This visit type was conducted due to national recommendations for restrictions regarding the COVID-19 Pandemic (e.g. social distancing) in an effort to limit this patient's exposure and mitigate transmission in our community.  Due to her co-morbid illnesses, this patient is at least at moderate risk for complications without adequate follow up.  This format is felt to be most appropriate for this patient at this time.  The patient did not have access to video technology/had technical difficulties with video requiring transitioning to audio format only (telephone).  All issues noted in this document were discussed and addressed.  No physical exam could be performed with this format.  Please refer to the patient's chart for her  consent to telehealth for [facility].   The patient was identified using 2 identifiers.  Date:  03/27/2020   ID:  Jasmine Dawson, Jasmine Dawson 1946-01-19, MRN [id number]  Patient Location: Home Provider Location: Office/Clinic  PCP:  Leilani Able, MD  Cardiologist:  Lance Muss, MD  Electrophysiologist:  Regan Lemming, MD   Evaluation Performed:  Follow-Up Visit  Chief Complaint:  F/u afib, pulmonary HTN  History of Present Illness:    Jaella Dawson is a 74 y.o. female with paroxysmal SVT, PVCs, HTN, HLD (followed by primary care), CKD III, prior tricuspid regurgitation and pulmonary HTN (not seen on f/u echo 2020), prothrombin gene mutation, heterozygous factor V Leiden (family hx of clotting), stroke, and PAF who presents for routine follow-up.  Jasmine Dawson has a longstanding history of paroxysmal SVT. Typically this resolves in a few minutes and responds to Valsalva maneuver. In 10/2017 she was readmitted with an episode that persisted. 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 Bystolic was changed to metoprolol. 2D echo 11/01/17 showed EF 65-70%, grade 1 DD, mod-severe TR, moderately increased PASP  . Her labs had shown Hgb 11.8, Cr 1.54, K 4.1, TSH wnl, troponins negative, d-dimer was high in 9.18. CT angio 10/31/17 had shown no PE, + mild cardiomegaly with trace pleural effusions. She had breakthrough SVT on metoprolol so this was changed to atenolol. Regarding her prior tricuspid regurgitation, pulmonary HTN, and family history of clotting, I had reviewed with DOD and we proceeded with repeat echo and hypercoag workup. Repeat echo interestingly had shown only mild TR and lower PA pressure so CTEPH was felt less likely. 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 I referred her to hematology. At that time she was not felt to require anticoagulation unless she developed a blood clot. Aggressive DVT prophylaxis was recommended for surgical procedures.   She was admitted early 07/2018 with left face tingling and right hand weakness and found to have left frontal parietal/MCA infarct felt embolic. 2D echo 07/30/18 showed EF 60-65%, impaired diastolic relaxation, trivial TR. Interestingly the pulmonary HTN was no longer an issue. LE venous duplex was negative. TEE was deferred due to Covid-19. TCD was unremarkable. She underwent loop recorder insertion by the EP team which ultimately demonstrated episodes of atrial fibrillation. Plavix was stopped and she was changed to Eliquis. Last device interrogation 03/18/20 showed 2 AF events longest lasting 1 hour 6 minutes (rates 60s-120s).  She is seen virtually for follow-up today and is doing well physically. She does not report any new chest pain, dyspnea, palpitations or syncope. Her laundry room is on a different floor and she feels it's good for her to go up  and down the stairs regularly which she has been doing without difficulty. She continues to grieve the loss of her son who died unexpectedly earlier this year due to an asthma attack. He was an organ donor and there are  plans for her to meet the folks in [place] who received the gift of his eye tissue.  Labs Independently Reviewed 07/2019 K 4.9, Cr 1.66, Mg 2.3, TSH wnl Per last note: 12/2018 LFTs ok, 07/2018 LDL 161, 2019 TSH wnl.  Past Medical History:  Diagnosis Date  . CKD (chronic kidney disease), stage III ([facility])   . Heterozygous factor V Leiden mutation ([facility])   . Hyperlipidemia   . Hypertension   . Motion sickness   . PAF (paroxysmal atrial fibrillation) ([facility])   . Prothrombin gene mutation ([facility])   . Pulmonary hypertension ([facility])    a. 2D echo 11/01/17 showed EF 65-70%, grade 1 DD, mod-severe TR, moderately increased PASP 47mmHg. b. F/u Echo 07/2018 not seen.  [name]. PVC's (premature ventricular contractions)    occasional PVCs  . Sinus bradycardia    a. requiring reduction in beta blocker in 2020.  [name]. Stroke (cerebrum) ([facility])   . SVT (supraventricular tachycardia) ([facility])   . Tricuspid regurgitation    Past Surgical History:  Procedure Laterality Date  . HYSTEROSCOPY WITH D & C  11/07/2011   Procedure: DILATATION AND CURETTAGE /HYSTEROSCOPY;  Surgeon: Antionette CharLisa Jackson-Moore, MD;  Location: [facility];  Service: Gynecology;  Laterality: N/A;  endometrial polypectomy  . LOOP RECORDER INSERTION N/A 07/30/2018   Procedure: LOOP RECORDER INSERTION;  Surgeon: Regan Lemmingamnitz, Will Martin, MD;  Location: [facility];  Service: Cardiovascular;  Laterality: N/A;  . TUBAL LIGATION  1977     Current Meds  Medication Sig  . acetaminophen (TYLENOL) 650 MG CR tablet Take 650 mg by mouth every 8 (eight) hours as needed for pain.  [name]. atenolol (TENORMIN) 50 MG tablet Take 1 tablet (50 mg total) by mouth daily.  . carboxymethylcellulose (REFRESH PLUS) 0.5 % SOLN Place 1 drop into both eyes daily as needed (for dry eyes).  . Chlorphen-PE-Acetaminophen (CORICIDIN D COLD/FLU/SINUS PO) Take 2 tablets by mouth as needed (Allergies).  [name]. ELIQUIS 5 MG TABS tablet TAKE 1 TABLET(5 MG) BY MOUTH TWICE DAILY  . famotidine (PEPCID) 20 MG tablet  Take 20 mg by mouth daily.  . meclizine (ANTIVERT) 25 MG tablet Take 25 mg by mouth as needed for dizziness.  . ondansetron (ZOFRAN) 4 MG tablet Take 1 tablet (4 mg total) by mouth every 8 (eight) hours as needed for nausea or vomiting.  . rosuvastatin (CRESTOR) 40 MG tablet Take 1 tablet (40 mg total) by mouth daily at 6 PM.     Allergies:   Eggs or egg-derived products, Orange fruit [citrus], Prednisone, Sulfa antibiotics, Tomato, Influenza vaccines, Aspartame and phenylalanine, Caffeine, and Shellfish allergy   Social History   Tobacco Use  . Smoking status: Never Smoker  . Smokeless tobacco: Never Used  Vaping Use  . Vaping Use: Never used  Substance Use Topics  . Alcohol use: No  . Drug use: No     Family Hx: The patient's family history includes Diabetes in her mother; Heart attack in her father and mother.  ROS:   Please see the history of present illness.    All other systems reviewed and are negative.   Prior CV studies:   The following studies were reviewed today:  2D echo 07/2018 1. The left ventricle has normal systolic function with  an ejection  fraction of 60-65%. The cavity size was normal. There is moderately  increased left ventricular wall thickness. Left ventricular diastolic  Doppler parameters are consistent with impaired  relaxation. Indeterminate filling pressures The E/e' is 8-15. No evidence  of left ventricular regional wall motion abnormalities.  2. The right ventricle has normal systolic function. The cavity was  normal. There is no increase in right ventricular wall thickness.  3. The mitral valve is degenerative. Mild thickening of the mitral valve  leaflet. There is mild mitral annular calcification present.  4. The tricuspid valve is grossly normal.  5. The aortic valve is tricuspid. Mild calcification of the aortic valve.  Aortic valve regurgitation is trivial by color flow Doppler.     Labs/Other Tests and Data Reviewed:    EKG:   An ECG dated 08/24/19 was personally reviewed today and demonstrated:  NSR 64bpm no acute STT changes.  Recent Labs: 04/16/2019: Hemoglobin 12.4; Platelets 226 08/25/2019: BUN 31; Creatinine, Ser 1.66; Magnesium 2.3; Potassium 4.9; Sodium 138; TSH 1.920   Recent Lipid Panel Lab Results  Component Value Date/Time   CHOL 240 (H) 07/29/2018 05:53 AM   TRIG 93 07/29/2018 05:53 AM   HDL 60 07/29/2018 05:53 AM   CHOLHDL 4.0 07/29/2018 05:53 AM   LDLCALC 161 (H) 07/29/2018 05:53 AM    Wt Readings from Last 3 Encounters:  03/27/20 180 lb (81.6 kg)  08/25/19 180 lb 12.8 oz (82 kg)  08/24/19 181 lb 12.8 oz (82.5 kg)     Objective:    Vital Signs:  BP 109/70   Pulse 62   Ht 5' 3.5" (1.613 m)   Wt 180 lb (81.6 kg)   BMI 31.39 kg/m    VS reviewed. General - calm F in no acute distress Pulm - No labored breathing, no coughing during visit, no audible wheezing, speaking in full sentences Neuro - A+Ox3, no slurred speech, answers questions appropriately Psych - Pleasant affect  ASSESSMENT & PLAN:    1. Paroxysmal atrial fibrillation - overall low burden, followed by her ILR which is managed by the EP team. She is doing well on Eliquis. We'll bring her in for repeat labs which will also reassess her Hgb and kidney function to ensure stable for current regimen. She has CKD which we discussed with her the last time we called her with labs (she was previously unaware of this finding per her report). Continue atenolol. There was previous confusion in other visits about what beta blocker she should be taking but she's been doing great on atenolol so we'll continue this. She reports she has told her pharmacy to stop filling the metoprolol since she is no longer on this. 2. PSVT - quiescent on current regimen. Continue BB as above. 3. Essential HTN - BP remains controlled on present regimen. No changes today. 4. Pulmonary hypertension - noted on remote echo, then resolved in 2020. In the absence of  any symptoms I am inclined to follow clinically for now. We can consider repeat surveillance echo at next OV depending on how she's doing. 5. Hyperlipidemia goal LDL <70 given stroke - she has not had recent lipids done per her report. We'll bring her in for CMET and lipid profile at next convenience. She does not think she can be fasting for these as she gets motion sickness on empty stomach riding in the car. She will have a cup of yogurt that morning but avoid high fat/sugary foods. She remains on Crestor at this  time.   Time:   Today, I have spent 13 minutes with the patient with telehealth technology discussing the above problems.     Medication Adjustments/Labs and Tests Ordered: Current medicines are reviewed at length with the patient today.  Testing and concerns regarding medicines are outlined above.    Follow Up: In 6 months with myself or Dr. Eldridge Dace. EP continues to be peripherally involved with ILR as well.  Signed, Laurann Montana, PA-C  03/27/2020 11:12 AM    [facility]

## 2020-03-26 NOTE — Progress Notes (Signed)
Carelink Summary Report / Loop Recorder 

## 2020-03-27 ENCOUNTER — Encounter: Payer: Self-pay | Admitting: Physician Assistant

## 2020-03-27 ENCOUNTER — Telehealth: Payer: Self-pay | Admitting: *Deleted

## 2020-03-27 ENCOUNTER — Other Ambulatory Visit: Payer: Self-pay

## 2020-03-27 ENCOUNTER — Telehealth (INDEPENDENT_AMBULATORY_CARE_PROVIDER_SITE_OTHER): Payer: Medicare Other | Admitting: Physician Assistant

## 2020-03-27 VITALS — BP 109/70 | HR 62 | Ht 63.5 in | Wt 180.0 lb

## 2020-03-27 DIAGNOSIS — I10 Essential (primary) hypertension: Secondary | ICD-10-CM | POA: Diagnosis not present

## 2020-03-27 DIAGNOSIS — I480 Paroxysmal atrial fibrillation: Secondary | ICD-10-CM | POA: Diagnosis not present

## 2020-03-27 DIAGNOSIS — I471 Supraventricular tachycardia: Secondary | ICD-10-CM | POA: Diagnosis not present

## 2020-03-27 DIAGNOSIS — E785 Hyperlipidemia, unspecified: Secondary | ICD-10-CM

## 2020-03-27 DIAGNOSIS — I272 Pulmonary hypertension, unspecified: Secondary | ICD-10-CM

## 2020-03-27 DIAGNOSIS — I63412 Cerebral infarction due to embolism of left middle cerebral artery: Secondary | ICD-10-CM | POA: Diagnosis not present

## 2020-03-27 NOTE — Telephone Encounter (Signed)
  Patient Consent for Virtual Visit         Jasmine Dawson has provided verbal consent on 03/27/2020 for a virtual visit (video or telephone).   CONSENT FOR VIRTUAL VISIT FOR:  Jasmine Dawson  By participating in this virtual visit I agree to the following:  I hereby voluntarily request, consent and authorize [facility] and its employed or contracted physicians, physician assistants, nurse practitioners or other licensed health care professionals (the Practitioner), to provide me with telemedicine health care services (the "Services") as deemed necessary by the treating Practitioner. I acknowledge and consent to receive the Services by the Practitioner via telemedicine. I understand that the telemedicine visit will involve communicating with the Practitioner through live audiovisual communication technology and the disclosure of certain medical information by electronic transmission. I acknowledge that I have been given the opportunity to request an in-person assessment or other available alternative prior to the telemedicine visit and am voluntarily participating in the telemedicine visit.  I understand that I have the right to withhold or withdraw my consent to the use of telemedicine in the course of my care at any time, without affecting my right to future care or treatment, and that the Practitioner or I may terminate the telemedicine visit at any time. I understand that I have the right to inspect all information obtained and/or recorded in the course of the telemedicine visit and may receive copies of available information for a reasonable fee.  I understand that some of the potential risks of receiving the Services via telemedicine include:  [name] Delay or interruption in medical evaluation due to technological equipment failure or disruption; . Information transmitted may not be sufficient (e.g. poor resolution of images) to allow for appropriate medical decision making by the  Practitioner; and/or  . In rare instances, security protocols could fail, causing a breach of personal health information.  Furthermore, I acknowledge that it is my responsibility to provide information about my medical history, conditions and care that is complete and accurate to the best of my ability. I acknowledge that Practitioner's advice, recommendations, and/or decision may be based on factors not within their control, such as incomplete or inaccurate data provided by me or distortions of diagnostic images or specimens that may result from electronic transmissions. I understand that the practice of medicine is not an exact science and that Practitioner makes no warranties or guarantees regarding treatment outcomes. I acknowledge that a copy of this consent can be made available to me via my patient portal  [facility] MyChart), or I can request a printed copy by calling the office of [facility].    I understand that my insurance will be billed for this visit.   I have read or had this consent read to me. . I understand the contents of this consent, which adequately explains the benefits and risks of the Services being provided via telemedicine.  . I have been provided ample opportunity to ask questions regarding this consent and the Services and have had my questions answered to my satisfaction. . I give my informed consent for the services to be provided through the use of telemedicine in my medical care

## 2020-03-27 NOTE — Patient Instructions (Signed)
Medication Instructions:  Your physician recommends that you continue on your current medications as directed. Please refer to the Current Medication list given to you today.  *If you need a refill on your cardiac medications before your next appointment, please call your pharmacy*   Lab Work: 04/09/2020:  Come to the office fasting for:  CMET, CBC, & LIPID  If you have labs (blood work) drawn today and your tests are completely normal, you will receive your results only by:  MyChart Message (if you have MyChart) OR  A paper copy in the mail If you have any lab test that is abnormal or we need to change your treatment, we will call you to review the results.   Testing/Procedures: None ordered  2Follow-Up: At [facility], you and your health needs are our priority.  As part of our continuing mission to provide you with exceptional heart care, we have created designated Provider Care Teams.  These Care Teams include your primary Cardiologist (physician) and Advanced Practice Providers (APPs -  Physician Assistants and Nurse Practitioners) who all work together to provide you with the care you need, when you need it.  We recommend signing up for the patient portal called "MyChart".  Sign up information is provided on this After Visit Summary.  MyChart is used to connect with patients for Virtual Visits (Telemedicine).  Patients are able to view lab/test results, encounter notes, upcoming appointments, etc.  Non-urgent messages can be sent to your provider as well.   To learn more about what you can do with MyChart, go to [email or web address].    Your next appointment:   6 month(s)  The format for your next appointment:   In Person  Provider:   Everette Rank, MD or Ronie Spies, PA-C   Other Instructions

## 2020-04-06 ENCOUNTER — Other Ambulatory Visit: Payer: Medicare Other

## 2020-04-09 ENCOUNTER — Other Ambulatory Visit: Payer: Medicare Other | Admitting: *Deleted

## 2020-04-09 ENCOUNTER — Other Ambulatory Visit: Payer: Self-pay

## 2020-04-09 DIAGNOSIS — E785 Hyperlipidemia, unspecified: Secondary | ICD-10-CM

## 2020-04-09 DIAGNOSIS — I471 Supraventricular tachycardia, unspecified: Secondary | ICD-10-CM

## 2020-04-09 DIAGNOSIS — I480 Paroxysmal atrial fibrillation: Secondary | ICD-10-CM

## 2020-04-09 DIAGNOSIS — I10 Essential (primary) hypertension: Secondary | ICD-10-CM

## 2020-04-09 DIAGNOSIS — I272 Pulmonary hypertension, unspecified: Secondary | ICD-10-CM

## 2020-04-09 LAB — CBC
Hematocrit: 39 % (ref 34.0–46.6)
Hemoglobin: 13 g/dL (ref 11.1–15.9)
MCH: 29 pg (ref 26.6–33.0)
MCHC: 33.3 g/dL (ref 31.5–35.7)
MCV: 87 fL (ref 79–97)
Platelets: 260 10*3/uL (ref 150–450)
RBC: 4.48 x10E6/uL (ref 3.77–5.28)
RDW: 13.2 % (ref 11.7–15.4)
WBC: 6.1 10*3/uL (ref 3.4–10.8)

## 2020-04-09 LAB — COMPREHENSIVE METABOLIC PANEL WITH GFR
ALT: 13 [IU]/L (ref 0–32)
AST: 33 [IU]/L (ref 0–40)
Albumin/Globulin Ratio: 1.3 (ref 1.2–2.2)
Albumin: 4.5 g/dL (ref 3.7–4.7)
Alkaline Phosphatase: 108 [IU]/L (ref 44–121)
BUN/Creatinine Ratio: 17 (ref 12–28)
BUN: 34 mg/dL — ABNORMAL HIGH (ref 8–27)
Bilirubin Total: 0.3 mg/dL (ref 0.0–1.2)
CO2: 25 mmol/L (ref 20–29)
Calcium: 9.7 mg/dL (ref 8.7–10.3)
Chloride: 98 mmol/L (ref 96–106)
Creatinine, Ser: 2 mg/dL — ABNORMAL HIGH (ref 0.57–1.00)
GFR calc Af Amer: 27 mL/min/{1.73_m2} — ABNORMAL LOW
GFR calc non Af Amer: 24 mL/min/{1.73_m2} — ABNORMAL LOW
Globulin, Total: 3.4 g/dL (ref 1.5–4.5)
Glucose: 130 mg/dL — ABNORMAL HIGH (ref 65–99)
Potassium: 3.6 mmol/L (ref 3.5–5.2)
Sodium: 137 mmol/L (ref 134–144)
Total Protein: 7.9 g/dL (ref 6.0–8.5)

## 2020-04-09 LAB — LIPID PANEL
Chol/HDL Ratio: 2.2 ratio (ref 0.0–4.4)
Cholesterol, Total: 157 mg/dL (ref 100–199)
HDL: 70 mg/dL (ref >39)
LDL Chol Calc (NIH): 69 mg/dL (ref 0–99)
Triglycerides: 103 mg/dL (ref 0–149)
VLDL Cholesterol Cal: 18 mg/dL (ref 5–40)

## 2020-04-23 ENCOUNTER — Ambulatory Visit (INDEPENDENT_AMBULATORY_CARE_PROVIDER_SITE_OTHER): Payer: Medicare Other

## 2020-04-23 DIAGNOSIS — Z8673 Personal history of transient ischemic attack (TIA), and cerebral infarction without residual deficits: Secondary | ICD-10-CM

## 2020-04-23 LAB — CUP PACEART REMOTE DEVICE CHECK
Date Time Interrogation Session: 20211224232024
Implantable Pulse Generator Implant Date: 20200403

## 2020-05-04 NOTE — Progress Notes (Signed)
Carelink Summary Report / Loop Recorder 

## 2020-05-18 ENCOUNTER — Other Ambulatory Visit: Payer: Self-pay | Admitting: Cardiology

## 2020-05-18 NOTE — Telephone Encounter (Signed)
Prescription refill request for Eliquis received.  Indication: Afib Last office visit: [place], 03/27/2020 Scr: 2.0, 04/09/2020 Age: 75 yo  Weight: 81.6 kg   Prescription refill sent.

## 2020-05-25 LAB — CUP PACEART REMOTE DEVICE CHECK
Date Time Interrogation Session: 20220126232751
Implantable Pulse Generator Implant Date: 20200403

## 2020-05-28 ENCOUNTER — Ambulatory Visit (INDEPENDENT_AMBULATORY_CARE_PROVIDER_SITE_OTHER): Payer: Medicare Other

## 2020-05-28 DIAGNOSIS — Z8673 Personal history of transient ischemic attack (TIA), and cerebral infarction without residual deficits: Secondary | ICD-10-CM

## 2020-06-06 NOTE — Progress Notes (Signed)
Carelink Summary Report / Loop Recorder 

## 2020-06-29 ENCOUNTER — Other Ambulatory Visit: Payer: Self-pay | Admitting: Interventional Cardiology

## 2020-07-02 ENCOUNTER — Ambulatory Visit (INDEPENDENT_AMBULATORY_CARE_PROVIDER_SITE_OTHER): Payer: Medicare Other

## 2020-07-02 DIAGNOSIS — Z8673 Personal history of transient ischemic attack (TIA), and cerebral infarction without residual deficits: Secondary | ICD-10-CM

## 2020-07-04 LAB — CUP PACEART REMOTE DEVICE CHECK
Date Time Interrogation Session: 20220228233501
Implantable Pulse Generator Implant Date: 20200403

## 2020-07-11 NOTE — Progress Notes (Signed)
Carelink Summary Report / Loop Recorder 

## 2020-08-04 LAB — CUP PACEART REMOTE DEVICE CHECK
Date Time Interrogation Session: 20220403013001
Implantable Pulse Generator Implant Date: 20200403

## 2020-08-06 ENCOUNTER — Ambulatory Visit (INDEPENDENT_AMBULATORY_CARE_PROVIDER_SITE_OTHER): Payer: Medicare Other

## 2020-08-06 DIAGNOSIS — Z8673 Personal history of transient ischemic attack (TIA), and cerebral infarction without residual deficits: Secondary | ICD-10-CM

## 2020-08-21 NOTE — Progress Notes (Signed)
Carelink Summary Report / Loop Recorder 

## 2020-09-10 ENCOUNTER — Ambulatory Visit (INDEPENDENT_AMBULATORY_CARE_PROVIDER_SITE_OTHER): Payer: Medicare Other

## 2020-09-10 DIAGNOSIS — Z8673 Personal history of transient ischemic attack (TIA), and cerebral infarction without residual deficits: Secondary | ICD-10-CM

## 2020-09-12 LAB — CUP PACEART REMOTE DEVICE CHECK
Date Time Interrogation Session: 20220514231230
Implantable Pulse Generator Implant Date: 20200403

## 2020-10-01 ENCOUNTER — Encounter (HOSPITAL_COMMUNITY): Payer: Self-pay | Admitting: Emergency Medicine

## 2020-10-01 ENCOUNTER — Emergency Department (HOSPITAL_BASED_OUTPATIENT_CLINIC_OR_DEPARTMENT_OTHER): Payer: Medicare Other

## 2020-10-01 ENCOUNTER — Emergency Department (HOSPITAL_COMMUNITY)
Admission: EM | Admit: 2020-10-01 | Discharge: 2020-10-01 | Disposition: A | Discharge status: Home / Self Care | Payer: Medicare Other | Attending: Emergency Medicine | Admitting: Emergency Medicine

## 2020-10-01 ENCOUNTER — Emergency Department (HOSPITAL_COMMUNITY): Payer: Medicare Other

## 2020-10-01 ENCOUNTER — Other Ambulatory Visit: Payer: Self-pay

## 2020-10-01 DIAGNOSIS — I129 Hypertensive chronic kidney disease with stage 1 through stage 4 chronic kidney disease, or unspecified chronic kidney disease: Secondary | ICD-10-CM | POA: Insufficient documentation

## 2020-10-01 DIAGNOSIS — M7989 Other specified soft tissue disorders: Secondary | ICD-10-CM

## 2020-10-01 DIAGNOSIS — Z79899 Other long term (current) drug therapy: Secondary | ICD-10-CM | POA: Diagnosis not present

## 2020-10-01 DIAGNOSIS — M25572 Pain in left ankle and joints of left foot: Secondary | ICD-10-CM | POA: Insufficient documentation

## 2020-10-01 DIAGNOSIS — N183 Chronic kidney disease, stage 3 unspecified: Secondary | ICD-10-CM | POA: Diagnosis not present

## 2020-10-01 DIAGNOSIS — R7989 Other specified abnormal findings of blood chemistry: Secondary | ICD-10-CM | POA: Diagnosis not present

## 2020-10-01 LAB — BASIC METABOLIC PANEL
Anion gap: 8 (ref 5–15)
BUN: 28 mg/dL — ABNORMAL HIGH (ref 8–23)
CO2: 28 mmol/L (ref 22–32)
Calcium: 9 mg/dL (ref 8.9–10.3)
Chloride: 104 mmol/L (ref 98–111)
Creatinine, Ser: 1.8 mg/dL — ABNORMAL HIGH (ref 0.44–1.00)
GFR, Estimated: 29 mL/min — ABNORMAL LOW (ref >60)
Glucose, Bld: 136 mg/dL — ABNORMAL HIGH (ref 70–99)
Potassium: 3.8 mmol/L (ref 3.5–5.1)
Sodium: 140 mmol/L (ref 135–145)

## 2020-10-01 LAB — CBC
HCT: 36.6 % (ref 36.0–46.0)
Hemoglobin: 11.7 g/dL — ABNORMAL LOW (ref 12.0–15.0)
MCH: 29.3 pg (ref 26.0–34.0)
MCHC: 32 g/dL (ref 30.0–36.0)
MCV: 91.5 fL (ref 80.0–100.0)
Platelets: 254 10*3/uL (ref 150–400)
RBC: 4 MIL/uL (ref 3.87–5.11)
RDW: 14.1 % (ref 11.5–15.5)
WBC: 5.8 10*3/uL (ref 4.0–10.5)
nRBC: 0 % (ref 0.0–0.2)

## 2020-10-01 LAB — D-DIMER, QUANTITATIVE: D-Dimer, Quant: 5.31 ug/mL-FEU — ABNORMAL HIGH (ref 0.00–0.50)

## 2020-10-01 NOTE — Progress Notes (Signed)
Lower extremity venous LT study completed.  Preliminary results relayed to [place], [place].   See CV Proc for preliminary results report.   Jean Rosenthal, RDMS, RVT

## 2020-10-01 NOTE — ED Triage Notes (Signed)
Pt reports left ankle pain, "feels like a cramp that won't pop out"  Good pulses.  Hx of blood clots

## 2020-10-01 NOTE — ED Provider Notes (Signed)
[facility] EMERGENCY DEPARTMENT Provider Note   CSN: [id number] Arrival date & time: 10/01/20  0131     History Chief Complaint  Patient presents with  . Ankle Pain    Jasmine Dawson is a 75 y.o. female presents to the Emergency Department complaining of gradual, persistent, progressively worsening left ankle/leg swelling and pain.  Pt reports that her ankle feels stuck.  She denies falls or known injury.  She reports the pain runs from her ankle up into her calf.  States that she has previously had a DVT and is concerned that this may be the cause of her left ankle pain and swelling.  She reports taking Eliquis and has not missed any doses.  Movement makes her symptoms worse.  Nothing seems to make them better.  No treatments prior to arrival.  Patient denies numbness, tingling, weakness, chest pain, shortness of breath.  She reports that walking makes symptoms worse.   The history is provided by the patient and medical records. No language interpreter was used.       Past Medical History:  Diagnosis Date  . CKD (chronic kidney disease), stage III (HCC)   . Heterozygous factor V Leiden mutation (HCC)   . Hyperlipidemia   . Hypertension   . Motion sickness   . PAF (paroxysmal atrial fibrillation) (HCC)   . Prothrombin gene mutation (HCC)   . Pulmonary hypertension (HCC)    a. 2D echo 11/01/17 showed EF 65-70%, grade 1 DD, mod-severe TR, moderately increased PASP . b. F/u Echo 07/2018 not seen.  [name] PVC's (premature ventricular contractions)    occasional PVCs  . Sinus bradycardia    a. requiring reduction in beta blocker in 2020.  [name] Stroke (cerebrum) (HCC)   . SVT (supraventricular tachycardia) (HCC)   . Tricuspid regurgitation     Patient Active Problem List   Diagnosis Date Noted  . Paroxysmal atrial fibrillation (HCC) 08/24/2019  . Secondary hypercoagulable state (HCC) 08/24/2019  . Hypertensive urgency   . Right arm weakness   . Cerebral  embolism with cerebral infarction 07/29/2018  . Weakness 07/28/2018  . Chest pain 10/31/2017  . Palpitations 10/31/2017  . Right leg pain 10/31/2017  . Elevated troponin 10/31/2017  . SVT (supraventricular tachycardia) (HCC) 10/03/2016  . CRI (chronic renal insufficiency), stage 3 (moderate) (HCC) 10/03/2016  . Endometrial polyp 11/07/2011  . Dizzy spells 09/11/2011  . HTN (hypertension) 09/11/2011  . Abdominal pain 09/11/2011  . Cholelithiasis 09/11/2011    Past Surgical History:  Procedure Laterality Date  . HYSTEROSCOPY WITH D & C  11/07/2011   Procedure: DILATATION AND CURETTAGE /HYSTEROSCOPY;  Surgeon: Antionette Char, MD;  Location: [facility] ORS;  Service: Gynecology;  Laterality: N/A;  endometrial polypectomy  . LOOP RECORDER INSERTION N/A 07/30/2018   Procedure: LOOP RECORDER INSERTION;  Surgeon: Regan Lemming, MD;  Location: [facility] INVASIVE CV LAB;  Service: Cardiovascular;  Laterality: N/A;  . TUBAL LIGATION  1977     OB History   No obstetric history on file.     Family History  Problem Relation Age of Onset  . Diabetes Mother        died at 88  . Heart attack Mother   . Heart attack Father        died at 85    Social History   Tobacco Use  . Smoking status: Never Smoker  . Smokeless tobacco: Never Used  Vaping Use  . Vaping Use: Never used  Substance Use Topics  .  Alcohol use: No  . Drug use: No    Home Medications Prior to Admission medications   Medication Sig Start Date End Date Taking? Authorizing Provider  acetaminophen (TYLENOL) 650 MG CR tablet Take 650 mg by mouth every 8 (eight) hours as needed for pain.    [provider]  atenolol (TENORMIN) 50 MG tablet TAKE 1 TABLET(50 MG) BY MOUTH DAILY 06/29/20   Corky Crafts, MD  carboxymethylcellulose (REFRESH PLUS) 0.5 % SOLN Place 1 drop into both eyes daily as needed (for dry eyes).    [provider]  Chlorphen-PE-Acetaminophen (CORICIDIN D COLD/FLU/SINUS PO) Take 2 tablets  by mouth as needed (Allergies).    [provider]  ELIQUIS 5 MG TABS tablet TAKE 1 TABLET(5 MG) BY MOUTH TWICE DAILY 05/18/20   Camnitz, Andree Coss, MD  famotidine (PEPCID) 20 MG tablet Take 20 mg by mouth daily.    [provider]  meclizine (ANTIVERT) 25 MG tablet Take 25 mg by mouth as needed for dizziness.    [provider]  ondansetron (ZOFRAN) 4 MG tablet Take 1 tablet (4 mg total) by mouth every 8 (eight) hours as needed for nausea or vomiting. 08/26/18   Arthor Captain, PA-C  rosuvastatin (CRESTOR) 40 MG tablet Take 1 tablet (40 mg total) by mouth daily at 6 PM. 07/31/18   Yvette Rack, MD    Allergies    Eggs or egg-derived products, Orange fruit [citrus], Prednisone, Sulfa antibiotics, Tomato, Influenza vaccines, Aspartame and phenylalanine, Caffeine, and Shellfish allergy  Review of Systems   Review of Systems  Constitutional: Negative for appetite change, diaphoresis, fatigue, fever and unexpected weight change.  HENT: Negative for mouth sores.   Eyes: Negative for visual disturbance.  Respiratory: Negative for cough, chest tightness, shortness of breath and wheezing.   Cardiovascular: Positive for leg swelling. Negative for chest pain.  Gastrointestinal: Negative for abdominal pain, constipation, diarrhea, nausea and vomiting.  Endocrine: Negative for polydipsia, polyphagia and polyuria.  Genitourinary: Negative for dysuria, frequency, hematuria and urgency.  Musculoskeletal: Positive for arthralgias and joint swelling. Negative for back pain and neck stiffness.  Skin: Negative for rash.  Allergic/Immunologic: Negative for immunocompromised state.  Neurological: Negative for syncope, light-headedness and headaches.  Hematological: Does not bruise/bleed easily.  Psychiatric/Behavioral: Negative for sleep disturbance. The patient is not nervous/anxious.     Physical Exam Updated Vital Signs BP (!) 155/48 (BP Location: Left Arm)   Pulse 60   Temp  98.5 F (36.9 C) (Oral)   Resp 18   SpO2 99%   Physical Exam Vitals and nursing note reviewed.  Constitutional:      General: She is not in acute distress.    Appearance: She is not diaphoretic.  HENT:     Head: Normocephalic.  Eyes:     General: No scleral icterus.    Conjunctiva/sclera: Conjunctivae normal.  Cardiovascular:     Rate and Rhythm: Normal rate and regular rhythm.     Pulses: Normal pulses.          Radial pulses are 2+ on the right side and 2+ on the left side.  Pulmonary:     Effort: No tachypnea, accessory muscle usage, prolonged expiration, respiratory distress or retractions.     Breath sounds: No stridor.     Comments: Equal chest rise. No increased work of breathing. Abdominal:     General: There is no distension.     Palpations: Abdomen is soft.     Tenderness: There is no  abdominal tenderness. There is no guarding or rebound.  Musculoskeletal:     Cervical back: Normal range of motion.     Right lower leg: Normal.     Left lower leg: Normal.     Right ankle: Normal.     Right Achilles Tendon: Normal.     Left ankle: Swelling ( trace) present. No ecchymosis. No tenderness. Normal range of motion.     Left Achilles Tendon: Normal.     Right foot: Normal.     Left foot: Normal.     Comments: Moves all extremities equally and without difficulty.  Skin:    General: Skin is warm and dry.     Capillary Refill: Capillary refill takes less than 2 seconds.  Neurological:     Mental Status: She is alert.     GCS: GCS eye subscore is 4. GCS verbal subscore is 5. GCS motor subscore is 6.     Comments: Speech is clear and goal oriented.  Psychiatric:        Mood and Affect: Mood normal.     ED Results / Procedures / Treatments   Labs (all labs ordered are listed, but only abnormal results are displayed) Labs Reviewed  CBC - Abnormal; Notable for the following components:      Result Value   Hemoglobin 11.7 (*)    All other components within normal  limits  BASIC METABOLIC PANEL - Abnormal; Notable for the following components:   Glucose, Bld 136 (*)    BUN 28 (*)    Creatinine, Ser 1.80 (*)    GFR, Estimated 29 (*)    All other components within normal limits  D-DIMER, QUANTITATIVE - Abnormal; Notable for the following components:   D-Dimer, Quant 5.31 (*)    All other components within normal limits     Radiology DG Ankle Complete Left  Result Date: 10/01/2020 CLINICAL DATA:  Left ankle pain EXAM: LEFT ANKLE COMPLETE - 3+ VIEW COMPARISON:  None. FINDINGS: There is no evidence of fracture, dislocation, or joint effusion. Mild degenerative change. Soft tissues are unremarkable. IMPRESSION: No fracture or dislocation of the left ankle. Electronically Signed   By: Deatra Robinson M.D.   On: 10/01/2020 02:32    Procedures Procedures   Medications Ordered in ED Medications - No data to display  ED Course  I have reviewed the triage vital signs and the nursing notes.  Pertinent labs & imaging results that were available during my care of the patient were reviewed by me and considered in my medical decision making (see chart for details).    MDM Rules/Calculators/A&P                           Patient presents for left-sided ankle pain and swelling.  No known trauma.  Seen by myself in triage.  Mild swelling around the left ankle and significantly decreased range of motion.  Palpation of the calf caused pain.  Work-up was initiated.  There is no vascular ultrasound overnight therefore D-dimer was ordered.  6:13 AM D-dimer significantly elevated.  Will obtain venous duplex scan.  On reassessment patient with full range of motion and decreased swelling.  She reports the pain has improved.  She no longer has pain in her calf.  No leukocytosis.  Patient afebrile.  Highly doubt septic joint.  Venous duplex pending.  If negative patient may be discharged home.  7:19 AM At shift change care was transferred to  Jodi GeraldsKelsey Ford, PA-C who will  follow pending studies, re-evaulate and determine disposition.    Final Clinical Impression(s) / ED Diagnoses Final diagnoses:  Acute left ankle pain    Rx / DC Orders ED Discharge Orders    None       Mela Perham, Boyd KerbsHannah, PA-C 10/01/20 0719    Melene PlanFloyd, Dan, DO 10/01/20 2303

## 2020-10-01 NOTE — Discharge Instructions (Signed)
Ultrasound shows no evidence of blood clot and x-ray and lab work look good.  Use Tylenol and warm compresses and follow-up with your primary care doctor if symptoms not improving.

## 2020-10-01 NOTE — ED Provider Notes (Signed)
Emergency Medicine Provider Triage Evaluation Note  Jasmine Dawson , a 75 y.o. female  was evaluated in triage.  Pt complains of left ankle pain onset several hours prior to arrival.  Patient reports she has a blood clot previously and was concerned this may be the cause of her left ankle pain and swelling.  She reports taking Eliquis.  Also reports that left ankle feels "stuck" it is mild.  Denies numbness, tingling, weakness or chest pain or shortness of breath..    Review of Systems  Positive: Left leg and ankle pain Negative: Chest pain, sob  Physical Exam  BP (!) 175/65 (BP Location: Left Arm)   Pulse 66   Temp 98.5 F (36.9 C) (Oral)   Resp 18   SpO2 99%  Gen:   Awake, no distress   Resp:  Normal effort  MSK:   Moves extremities without difficulty  Other:  Mild TTP of the left ankle, mild swelling and decreased ROM  Medical Decision Making  Medically screening exam initiated at 1:50 AM.  Appropriate orders placed.  Jasmine Dawson was informed that the remainder of the evaluation will be completed by another provider, this initial triage assessment does not replace that evaluation, and the importance of remaining in the ED until their evaluation is complete.  W/u for ankle pain initiated.    Corley Kohls, Boyd Kerbs 10/01/20 0203    Melene Plan, DO 10/01/20 [phone]

## 2020-10-01 NOTE — ED Provider Notes (Signed)
Care assumed from [facility] at shift change, please see her note for full details, but in brief Jasmine Dawson is a 75 y.o. female who presents with progressively worsening left ankle swelling and pain.  Has previously had DVT and has history of factor V Leiden, is on Eliquis but is concerned this could be a blood clot.  No redness, warmth or swelling focally over the joint.  Prior care team was not concerned for septic arthritis.  Did consider gout.  Without intervention pain seemed to improve and patient had significantly improved range of motion.  Does report hitting her knee last week but is not having knee pain.  Lab work including D-dimer was collected by prior care team, D-dimer elevated at 5.31, so we will proceed with DVT study, patient not having any chest pain or shortness of breath to suggest PE.  X-ray of the left ankle unremarkable.  Labs otherwise unremarkable.  Plan: DVT study, if negative can be discharged home with PCP follow up  Physical Exam  BP (!) 159/65   Pulse 61   Temp 98.5 F (36.9 C) (Oral)   Resp 18   SpO2 100%   Physical Exam Vitals and nursing note reviewed.  Constitutional:      General: She is not in acute distress.    Appearance: She is well-developed. She is not diaphoretic.  HENT:     Head: Normocephalic and atraumatic.  Eyes:     General:        Right eye: No discharge.        Left eye: No discharge.  Pulmonary:     Effort: Pulmonary effort is normal. No respiratory distress.  Musculoskeletal:     Comments: Mild tenderness to the left lower leg but normal range of motion at the knee and ankle without erythema, minimal swelling noted  Neurological:     Mental Status: She is alert.     Coordination: Coordination normal.  Psychiatric:        Behavior: Behavior normal.     ED Course/Procedures   Labs Reviewed  CBC - Abnormal; Notable for the following components:      Result Value   Hemoglobin 11.7 (*)    All other components  within normal limits  BASIC METABOLIC PANEL - Abnormal; Notable for the following components:   Glucose, Bld 136 (*)    BUN 28 (*)    Creatinine, Ser 1.80 (*)    GFR, Estimated 29 (*)    All other components within normal limits  D-DIMER, QUANTITATIVE - Abnormal; Notable for the following components:   D-Dimer, Quant 5.31 (*)    All other components within normal limits   DG Ankle Complete Left  Result Date: 10/01/2020 CLINICAL DATA:  Left ankle pain EXAM: LEFT ANKLE COMPLETE - 3+ VIEW COMPARISON:  None. FINDINGS: There is no evidence of fracture, dislocation, or joint effusion. Mild degenerative change. Soft tissues are unremarkable. IMPRESSION: No fracture or dislocation of the left ankle. Electronically Signed   By: Deatra Robinson M.D.   On: 10/01/2020 02:32     Procedures  MDM   DVT study is negative.  We will have patient use Tylenol, ice and heat and follow-up with PCP if not improving.  Return precautions discussed.  Discharged home in good condition.       Dartha Lodge, PA-C 10/01/20 1035    Gwyneth Sprout, MD 10/01/20 1159

## 2020-10-01 NOTE — ED Notes (Signed)
Pt d/c home per MD order. Discharge summary reviewed with pt, pt verbalizes understanding. No s/s of acute distress noted at discharge. Discharged home with husband.  

## 2020-10-03 NOTE — Progress Notes (Signed)
Carelink Summary Report / Loop Recorder 

## 2020-10-09 NOTE — Progress Notes (Signed)
Cardiology Office Note    Date:  10/10/2020   ID:  Jasmine, Dawson 03-Dec-1945, MRN [id number]  PCP:  Leilani Able, MD  Cardiologist:  Lance Muss, MD  Electrophysiologist:  Regan Lemming, MD   Chief Complaint: f/u afib  History of Present Illness:   Jasmine Dawson is a 75 y.o. female with history of paroxysmal SVT, PVCs, PAF, HTN, HLD, CKD IIIb, prior tricuspid regurgitation and pulmonary HTN (not seen on f/u echo 2018/08/22), prothrombin gene mutation, heterozygous factor V Leiden (family hx of clotting), stroke who presents for routine follow-up.   [name] has a longstanding history of paroxysmal SVT. 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 Bystolic was changed to metoprolol. 2D echo 11/01/17 showed EF 65-70%, grade 1 DD, mod-severe TR, moderately increased PASP . Her labs had shown Hgb 11.8, Cr 1.54, K 4.1, TSH wnl, troponins negative, d-dimer was high in 9.18. CT angio 10/31/17 had shown no PE, + mild cardiomegaly with trace pleural effusions. She had breakthrough SVT on metoprolol so this was changed to atenolol. Regarding her prior tricuspid regurgitation, pulmonary HTN, and family history of clotting, I had reviewed with DOD and we proceeded with repeat echo and hypercoag workup. Repeat echo interestingly had shown only mild TR and lower PA pressure so CTEPH was felt less likely. 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 I referred her to hematology. At that time she was not felt to require anticoagulation unless she developed a blood clot. Aggressive DVT prophylaxis was recommended for surgical procedures.   She was admitted early 08-22-18 with left face tingling and right hand weakness and found to have left frontal parietal/MCA infarct felt embolic. 2D echo 07/30/18 showed EF 60-65%, impaired diastolic relaxation, trivial TR.  Interestingly the pulmonary HTN was no longer an issue. LE venous duplex was negative. TEE was deferred due to Covid-19. TCD was unremarkable. She underwent loop recorder insertion by the EP team which ultimately demonstrated episodes of atrial fibrillation followed by [name]. Plavix was stopped and she was changed to Eliquis. Her son passed away unexpectedly in 2019/08/22 after an asthma attack. He was an organ donor.  She is seen back for follow-up today doing great without any CP, SOB, palpitations, or dizziness. She had an injury to her knee prompting ED visit earlier this month - had hit her leg against a dining room table leg with some swelling/pain in her ankle. LE venous duplex was negative for DVT. This issue subsequently resolved. She reports home BPs are all below 130 systolic, I.e. 111/70.   Labwork independently reviewed: 09/2020 D-dimer 5.3, Hgb 11.7, K 3.8, Cr 1.80 -> LE venous duplex negative for DVT. No concern for PE at that time clinically. Asymptomatic at today's OV. 03/2020 LDL 69, LFTs wnl 2019/08/22 TSH wnl  Past Medical History:  Diagnosis Date   CKD (chronic kidney disease), stage III (HCC)    Heterozygous factor V Leiden mutation (HCC)    Hyperlipidemia    Hypertension    Motion sickness    PAF (paroxysmal atrial fibrillation) (HCC)    Prothrombin gene mutation (HCC)    Pulmonary hypertension (HCC)    a. 2D echo 11/01/17 showed EF 65-70%, grade 1 DD, mod-severe TR, moderately increased PASP . b. F/u Echo 08/22/18 not seen.   PVC's (premature ventricular contractions)    occasional PVCs   Sinus bradycardia  a. requiring reduction in beta blocker in 2020.   Stroke (cerebrum) (HCC)    SVT (supraventricular tachycardia) (HCC)    Tricuspid regurgitation     Past Surgical History:  Procedure Laterality Date   HYSTEROSCOPY WITH D & C  11/07/2011   Procedure: DILATATION AND CURETTAGE /HYSTEROSCOPY;  Surgeon: Antionette Char, MD;  Location: [facility];  Service:  Gynecology;  Laterality: N/A;  endometrial polypectomy   LOOP RECORDER INSERTION N/A 07/30/2018   Procedure: LOOP RECORDER INSERTION;  Surgeon: Regan Lemming, MD;  Location: [facility];  Service: Cardiovascular;  Laterality: N/A;   TUBAL LIGATION  1977    Current Medications: Current Meds  Medication Sig   acetaminophen (TYLENOL) 650 MG CR tablet Take 650 mg by mouth every 8 (eight) hours as needed for pain.   atenolol (TENORMIN) 50 MG tablet TAKE 1 TABLET(50 MG) BY MOUTH DAILY   carboxymethylcellulose (REFRESH PLUS) 0.5 % SOLN Place 1 drop into both eyes daily as needed (for dry eyes).   Chlorphen-PE-Acetaminophen (CORICIDIN D COLD/FLU/SINUS PO) Take 2 tablets by mouth as needed (Allergies).   ELIQUIS 5 MG TABS tablet TAKE 1 TABLET(5 MG) BY MOUTH TWICE DAILY   famotidine (PEPCID) 20 MG tablet Take 20 mg by mouth daily.   rosuvastatin (CRESTOR) 40 MG tablet Take 1 tablet (40 mg total) by mouth daily at 6 PM.      Allergies:   Eggs or egg-derived products, Orange fruit [citrus], Prednisone, Sulfa antibiotics, Tomato, Influenza vaccines, Aspartame and phenylalanine, Caffeine, and Shellfish allergy   Social History   Socioeconomic History   Marital status: Married    Spouse name: Not on file   Number of children: Not on file   Years of education: Not on file   Highest education level: Not on file  Occupational History   Not on file  Tobacco Use   Smoking status: Never   Smokeless tobacco: Never  Vaping Use   Vaping Use: Never used  Substance and Sexual Activity   Alcohol use: No   Drug use: No   Sexual activity: Not Currently  Other Topics Concern   Not on file  Social History Narrative   Did daycare as a younger lady.  Retired at [employer] [phone]             Social Determinants of Corporate investment banker Strain: Not on file  Food Insecurity: Not on file  Transportation Needs: Not on file  Physical Activity: Not on file  Stress:  Not on file  Social Connections: Not on file     Family History:  The patient's family history includes Diabetes in her mother; Heart attack in her father and mother.  ROS:   Please see the history of present illness.  All other systems are reviewed and otherwise negative.    EKGs/Labs/Other Studies Reviewed:    Studies reviewed are outlined and summarized above. Reports included below if pertinent.  2D echo 07/2018  1. The left ventricle has normal systolic function with an ejection fraction of 60-65%. The cavity size was normal. There is moderately increased left ventricular wall thickness. Left ventricular diastolic Doppler parameters are consistent with impaired  relaxation. Indeterminate filling pressures The E/e' is 8-15. No evidence of left ventricular regional wall motion abnormalities.  2. The right ventricle has normal systolic function. The cavity was normal. There is no increase in right ventricular wall thickness.  3. The mitral valve is degenerative. Mild thickening of the mitral valve  leaflet. There is mild mitral annular calcification present.  4. The tricuspid valve is grossly normal.  5. The aortic valve is tricuspid. Mild calcification of the aortic valve. Aortic valve regurgitation is trivial by color flow Doppler.     EKG:  EKG is ordered today, personally reviewed, demonstrating NSR 61bpm, 1st degree AVB,  no acute STT changes  Recent Labs: 04/09/2020: ALT 13 10/01/2020: BUN 28; Creatinine, Ser 1.80; Hemoglobin 11.7; Platelets 254; Potassium 3.8; Sodium 140  Recent Lipid Panel    Component Value Date/Time   CHOL 157 04/09/2020 0915   TRIG 103 04/09/2020 0915   HDL 70 04/09/2020 0915   CHOLHDL 2.2 04/09/2020 0915   CHOLHDL 4.0 07/29/2018 0553   VLDL 19 07/29/2018 0553   LDLCALC 69 04/09/2020 0915    PHYSICAL EXAM:    VS:  BP 140/70 (BP Location: Left Arm, Patient Position: Sitting, Cuff Size: Normal)   Pulse 61   Ht 5' 3.5" (1.613 m)   Wt 188 lb  (85.3 kg)   SpO2 98%   BMI 32.78 kg/m   BMI: Body mass index is 32.78 kg/m.  GEN: Well nourished, well developed female in no acute distress HEENT: normocephalic, atraumatic Neck: no JVD, carotid bruits, or masses Cardiac: RRR; no murmurs, rubs, or gallops, no edema  Respiratory:  clear to auscultation bilaterally, normal work of breathing GI: soft, nontender, nondistended, + BS MS: no deformity or atrophy Skin: warm and dry, no rash Neuro:  Alert and Oriented x 3, Strength and sensation are intact, follows commands Psych: euthymic mood, full affect  Wt Readings from Last 3 Encounters:  10/10/20 188 lb (85.3 kg)  03/27/20 180 lb (81.6 kg)  08/25/19 180 lb 12.8 oz (82 kg)     ASSESSMENT & PLAN:   Paroxysmal atrial fib, with mild anemia on labs - asymptomatic. Continue atenolol and Eliquis at present doses. Last device interrogation showed 1 episode of atrial fib 1:36hrs, not perceived by the patient. Her atrial fib burden is followed by her loop recorder interrogations. Recent Hgb in the ED showed slight downtrend from prior, possibly traumatic in nature related to injury. We'll recheck a hemoglobin level today to ensure stability.  PSVT - quiescent. Continue atenolol 50mg  daily.  Essential HTN - suboptimal BP control in the office but this is common for her - it is well controlled at home by her report. The patient was instructed to monitor their blood pressure at home and to call if tending to run higher than 130 systolic.  Pulmonary HTN - remotely seen on echo 2019, but not seen on echocardiogram in 2020. She is asymptomatic. Continue clinical surveillance for symptoms.  Hyperlipidemia with goal LDL <70 given h/o stroke - continue Crestor 40mg  daily. Last LDL reviewed from 03/2020 and at goal. Will plan to recheck when she returns for appointment in 6 months.  CKD stage IIIb - discussed avoidance of NSAIDs. Last labs reviewed, creatinine relatively similar to prior trends. Per  our discussion, we will refer to nephrology to establish care.  Disposition: F/u with myself or Dr. in 6 months.  Medication Adjustments/Labs and Tests Ordered: Current medicines are reviewed at length with the patient today.  Concerns regarding medicines are outlined above. Medication changes, Labs and Tests ordered today are summarized above and listed in the Patient Instructions accessible in Encounters.   Signed, 04/2020, PA-C  10/10/2020 10:09 AM    [facility] [address] [place], [place], [place]  [place] Phone: [phone])  [phone]; Fax: [phone]

## 2020-10-10 ENCOUNTER — Encounter: Payer: Self-pay | Admitting: Physician Assistant

## 2020-10-10 ENCOUNTER — Ambulatory Visit (INDEPENDENT_AMBULATORY_CARE_PROVIDER_SITE_OTHER): Payer: Medicare Other | Admitting: Physician Assistant

## 2020-10-10 ENCOUNTER — Other Ambulatory Visit: Payer: Self-pay

## 2020-10-10 VITALS — BP 140/70 | HR 61 | Ht 63.5 in | Wt 188.0 lb

## 2020-10-10 DIAGNOSIS — D649 Anemia, unspecified: Secondary | ICD-10-CM | POA: Diagnosis not present

## 2020-10-10 DIAGNOSIS — I10 Essential (primary) hypertension: Secondary | ICD-10-CM | POA: Diagnosis not present

## 2020-10-10 DIAGNOSIS — I471 Supraventricular tachycardia: Secondary | ICD-10-CM

## 2020-10-10 DIAGNOSIS — I480 Paroxysmal atrial fibrillation: Secondary | ICD-10-CM | POA: Diagnosis not present

## 2020-10-10 DIAGNOSIS — I272 Pulmonary hypertension, unspecified: Secondary | ICD-10-CM | POA: Diagnosis not present

## 2020-10-10 DIAGNOSIS — E785 Hyperlipidemia, unspecified: Secondary | ICD-10-CM

## 2020-10-10 DIAGNOSIS — N1832 Chronic kidney disease, stage 3b: Secondary | ICD-10-CM

## 2020-10-10 LAB — HEMOGLOBIN: Hemoglobin: 12.5 g/dL (ref 11.1–15.9)

## 2020-10-10 NOTE — Progress Notes (Signed)
Pt has been made aware of normal result and verbalized understanding.  jw

## 2020-10-10 NOTE — Patient Instructions (Addendum)
Medication Instructions:  Your physician recommends that you continue on your current medications as directed. Please refer to the Current Medication list given to you today.  *If you need a refill on your cardiac medications before your next appointment, please call your pharmacy*   Lab Work: TODAY:  [facility]  If you have labs (blood work) drawn today and your tests are completely normal, you will receive your results only by: MyChart Message (if you have MyChart) OR A paper copy in the mail If you have any lab test that is abnormal or we need to change your treatment, we will call you to review the results.   Testing/Procedures: None ordered   ,You have been referred to [facility].  THEY WILL CALL YOU WITH AN APPOINTMENT.    Follow-Up: At [facility], you and your health needs are our priority.  As part of our continuing mission to provide you with exceptional heart care, we have created designated Provider Care Teams.  These Care Teams include your primary Cardiologist (physician) and Advanced Practice Providers (APPs -  Physician Assistants and Nurse Practitioners) who all work together to provide you with the care you need, when you need it.  We recommend signing up for the patient portal called "MyChart".  Sign up information is provided on this After Visit Summary.  MyChart is used to connect with patients for Virtual Visits (Telemedicine).  Patients are able to view lab/test results, encounter notes, upcoming appointments, etc.  Non-urgent messages can be sent to your provider as well.   To learn more about what you can do with MyChart, go to [email or web address].    Your next appointment:   6 month(s)   04/11/2021 ARRIVE AT 9:45.  PLEASE COME FASTING SO WE CAN DRAW SOME LAB WORK.  The format for your next appointment:   In Person  Provider:   You may see Lance Muss, MD or one of the following Advanced Practice Providers on your designated Care Team:    Ronie Spies, [place]    Other Instructions

## 2020-10-15 ENCOUNTER — Ambulatory Visit (INDEPENDENT_AMBULATORY_CARE_PROVIDER_SITE_OTHER): Payer: Medicare Other

## 2020-10-15 DIAGNOSIS — I480 Paroxysmal atrial fibrillation: Secondary | ICD-10-CM | POA: Diagnosis not present

## 2020-10-15 LAB — CUP PACEART REMOTE DEVICE CHECK
Date Time Interrogation Session: 20220616232143
Implantable Pulse Generator Implant Date: 20200403

## 2020-11-05 NOTE — Progress Notes (Signed)
Carelink Summary Report / Loop Recorder 

## 2020-11-15 LAB — CUP PACEART REMOTE DEVICE CHECK
Date Time Interrogation Session: 20220719233245
Implantable Pulse Generator Implant Date: 20200403

## 2020-11-19 ENCOUNTER — Ambulatory Visit (INDEPENDENT_AMBULATORY_CARE_PROVIDER_SITE_OTHER): Payer: Medicare Other

## 2020-11-19 DIAGNOSIS — I480 Paroxysmal atrial fibrillation: Secondary | ICD-10-CM

## 2020-11-23 ENCOUNTER — Other Ambulatory Visit: Payer: Self-pay | Admitting: Cardiology

## 2020-11-23 NOTE — Telephone Encounter (Signed)
Prescription refill request for Eliquis received. Indication: Afib  Last office visit: 10/10/20 ([place]) Scr: 1.80 (10/01/20) Age: 75 Weight: 85.3kg  Appropriate dose and refill sent to requested pharmacy.

## 2020-12-14 NOTE — Progress Notes (Signed)
Carelink Summary Report / Loop Recorder 

## 2020-12-17 ENCOUNTER — Ambulatory Visit (INDEPENDENT_AMBULATORY_CARE_PROVIDER_SITE_OTHER): Payer: Medicare Other

## 2020-12-17 DIAGNOSIS — I480 Paroxysmal atrial fibrillation: Secondary | ICD-10-CM

## 2020-12-18 LAB — CUP PACEART REMOTE DEVICE CHECK
Date Time Interrogation Session: 20220822000802
Implantable Pulse Generator Implant Date: 20200403

## 2021-01-02 NOTE — Progress Notes (Signed)
Carelink Summary Report / Loop Recorder 

## 2021-01-21 ENCOUNTER — Ambulatory Visit (INDEPENDENT_AMBULATORY_CARE_PROVIDER_SITE_OTHER): Payer: Medicare Other

## 2021-01-21 DIAGNOSIS — I480 Paroxysmal atrial fibrillation: Secondary | ICD-10-CM

## 2021-01-21 LAB — CUP PACEART REMOTE DEVICE CHECK
Date Time Interrogation Session: 20220924000718
Implantable Pulse Generator Implant Date: 20200403

## 2021-01-24 ENCOUNTER — Other Ambulatory Visit: Payer: Self-pay | Admitting: Nephrology

## 2021-01-24 DIAGNOSIS — N1832 Chronic kidney disease, stage 3b: Secondary | ICD-10-CM

## 2021-01-29 ENCOUNTER — Ambulatory Visit
Admission: RE | Admit: 2021-01-29 | Discharge: 2021-01-29 | Disposition: A | Discharge status: Home / Self Care | Payer: Medicare Other | Source: Ambulatory Visit | Attending: Nephrology | Admitting: Nephrology

## 2021-01-29 DIAGNOSIS — N1832 Chronic kidney disease, stage 3b: Secondary | ICD-10-CM

## 2021-01-29 NOTE — Progress Notes (Signed)
Carelink Summary Report / Loop Recorder 

## 2021-02-21 LAB — CUP PACEART REMOTE DEVICE CHECK
Date Time Interrogation Session: 20221027001403
Implantable Pulse Generator Implant Date: 20200403

## 2021-02-25 ENCOUNTER — Ambulatory Visit (INDEPENDENT_AMBULATORY_CARE_PROVIDER_SITE_OTHER): Payer: Medicare Other

## 2021-02-25 DIAGNOSIS — I480 Paroxysmal atrial fibrillation: Secondary | ICD-10-CM

## 2021-03-04 NOTE — Progress Notes (Signed)
Carelink Summary Report / Loop Recorder 

## 2021-03-27 LAB — CUP PACEART REMOTE DEVICE CHECK
Date Time Interrogation Session: 20221128234440
Implantable Pulse Generator Implant Date: 20200403

## 2021-04-01 ENCOUNTER — Ambulatory Visit (INDEPENDENT_AMBULATORY_CARE_PROVIDER_SITE_OTHER): Payer: Medicare Other

## 2021-04-01 DIAGNOSIS — I480 Paroxysmal atrial fibrillation: Secondary | ICD-10-CM

## 2021-04-08 NOTE — Progress Notes (Deleted)
Cardiology Office Note   Date:  04/08/2021   ID:  Preslie, Depasquale May 21, 1945, MRN [id number]  PCP:  Leilani Able, MD    No chief complaint on file.  SVT  Wt Readings from Last 3 Encounters:  10/10/20 188 lb (85.3 kg)  03/27/20 180 lb (81.6 kg)  08/25/19 180 lb 12.8 oz (82 kg)       History of Present Illness: Jasmine Dawson is a 75 y.o. female  with a h/o SVT.  In 2018, "She went to the ER after feeling palpitations.  SHe used some old Flonase and then had SVT documented at 64.  Broke with Valsalva." Coreg was started at that time. She was folowed for HTN as well.     Past Medical History:  Diagnosis Date   CKD (chronic kidney disease), stage III (HCC)    Heterozygous factor V Leiden mutation (HCC)    Hyperlipidemia    Hypertension    Motion sickness    PAF (paroxysmal atrial fibrillation) (HCC)    Prothrombin gene mutation (HCC)    Pulmonary hypertension (HCC)    a. 2D echo 11/01/17 showed EF 65-70%, grade 1 DD, mod-severe TR, moderately increased PASP . b. F/u Echo 07/2018 not seen.   PVC's (premature ventricular contractions)    occasional PVCs   Sinus bradycardia    a. requiring reduction in beta blocker in 2020.   Stroke (cerebrum) (HCC)    SVT (supraventricular tachycardia) (HCC)    Tricuspid regurgitation     Past Surgical History:  Procedure Laterality Date   HYSTEROSCOPY WITH D & C  11/07/2011   Procedure: DILATATION AND CURETTAGE /HYSTEROSCOPY;  Surgeon: Antionette Char, MD;  Location: [facility];  Service: Gynecology;  Laterality: N/A;  endometrial polypectomy   LOOP RECORDER INSERTION N/A 07/30/2018   Procedure: LOOP RECORDER INSERTION;  Surgeon: Regan Lemming, MD;  Location: [facility];  Service: Cardiovascular;  Laterality: N/A;   TUBAL LIGATION  1977     Current Outpatient Medications  Medication Sig Dispense Refill   acetaminophen (TYLENOL) 650 MG CR tablet Take 650 mg by mouth every 8 (eight) hours as needed for  pain.     atenolol (TENORMIN) 50 MG tablet TAKE 1 TABLET(50 MG) BY MOUTH DAILY 90 tablet 2   carboxymethylcellulose (REFRESH PLUS) 0.5 % SOLN Place 1 drop into both eyes daily as needed (for dry eyes).     Chlorphen-PE-Acetaminophen (CORICIDIN D COLD/FLU/SINUS PO) Take 2 tablets by mouth as needed (Allergies).     ELIQUIS 5 MG TABS tablet TAKE 1 TABLET(5 MG) BY MOUTH TWICE DAILY 60 tablet 5   famotidine (PEPCID) 20 MG tablet Take 20 mg by mouth daily.     rosuvastatin (CRESTOR) 40 MG tablet Take 1 tablet (40 mg total) by mouth daily at 6 PM. 30 tablet 3   No current facility-administered medications for this visit.    Allergies:   Eggs or egg-derived products, Orange fruit [citrus], Prednisone, Sulfa antibiotics, Tomato, Influenza vaccines, Aspartame and phenylalanine, Caffeine, and Shellfish allergy    Social History:  The patient  reports that she has never smoked. She has never used smokeless tobacco. She reports that she does not drink alcohol and does not use drugs.   Family History:  The patient's ***family history includes Diabetes in her mother; Heart attack in her father and mother.    ROS:  Please see the history of present illness.   Otherwise, review of systems are positive for ***.  All other systems are reviewed and negative.    PHYSICAL EXAM: VS:  There were no vitals taken for this visit. , BMI There is no height or weight on file to calculate BMI. GEN: Well nourished, well developed, in no acute distress HEENT: normal Neck: no JVD, carotid bruits, or masses Cardiac: ***RRR; no murmurs, rubs, or gallops,no edema  Respiratory:  clear to auscultation bilaterally, normal work of breathing GI: soft, nontender, nondistended, + BS MS: no deformity or atrophy Skin: warm and dry, no rash Neuro:  Strength and sensation are intact Psych: euthymic mood, full affect   EKG:   The ekg ordered today demonstrates ***   Recent Labs: 04/09/2020: ALT 13 10/01/2020: BUN 28;  Creatinine, Ser 1.80; Platelets 254; Potassium 3.8; Sodium 140 10/10/2020: Hemoglobin 12.5   Lipid Panel    Component Value Date/Time   CHOL 157 04/09/2020 0915   TRIG 103 04/09/2020 0915   HDL 70 04/09/2020 0915   CHOLHDL 2.2 04/09/2020 0915   CHOLHDL 4.0 07/29/2018 0553   VLDL 19 07/29/2018 0553   LDLCALC 69 04/09/2020 0915     Other studies Reviewed: Additional studies/ records that were reviewed today with results demonstrating: ***.   ASSESSMENT AND PLAN:  SVT:  HTN: CRI: H/o CVA:   Current medicines are reviewed at length with the patient today.  The patient concerns regarding her medicines were addressed.  The following changes have been made:  No change***  Labs/ tests ordered today include: *** No orders of the defined types were placed in this encounter.   Recommend 150 minutes/week of aerobic exercise Low fat, low carb, high fiber diet recommended  Disposition:   FU in ***   Signed, Larae Grooms, MD  04/08/2021 1:27 PM    [facility] [place], [place], [place]  [address] Phone: [phone]; Fax: [phone]

## 2021-04-10 ENCOUNTER — Other Ambulatory Visit: Payer: Self-pay | Admitting: Interventional Cardiology

## 2021-04-10 ENCOUNTER — Telehealth: Payer: Self-pay | Admitting: Interventional Cardiology

## 2021-04-10 NOTE — Telephone Encounter (Signed)
Pt advised to discuss further w/ PCP. Patient verbalized understanding and agreeable to plan.

## 2021-04-10 NOTE — Telephone Encounter (Signed)
Eliquis is not known to cause dizziness. She has also been taking Eliquis since 2020. Would advise she follow up with her PCP to evaluate for other potential causes of dizziness.

## 2021-04-10 NOTE — Progress Notes (Signed)
Carelink Summary Report / Loop Recorder 

## 2021-04-10 NOTE — Telephone Encounter (Signed)
Pt c/o medication issue:  1. Name of Medication: ELIQUIS 5 MG TABS tablet  2. How are you currently taking this medication (dosage and times per day)? TAKE 1 TABLET(5 MG) BY MOUTH TWICE DAILY  3. Are you having a reaction (difficulty breathing--STAT)? no  4. What is your medication issue? Patient states when she take this medication she gets very dizzy. She doesn't like how it has her feeling. Please advise

## 2021-04-11 ENCOUNTER — Ambulatory Visit: Payer: Medicare Other | Admitting: Interventional Cardiology

## 2021-04-11 DIAGNOSIS — I471 Supraventricular tachycardia: Secondary | ICD-10-CM

## 2021-04-11 DIAGNOSIS — Z8673 Personal history of transient ischemic attack (TIA), and cerebral infarction without residual deficits: Secondary | ICD-10-CM

## 2021-04-11 DIAGNOSIS — N1832 Chronic kidney disease, stage 3b: Secondary | ICD-10-CM

## 2021-04-11 DIAGNOSIS — I10 Essential (primary) hypertension: Secondary | ICD-10-CM

## 2021-05-06 ENCOUNTER — Ambulatory Visit (INDEPENDENT_AMBULATORY_CARE_PROVIDER_SITE_OTHER): Payer: Medicare Other

## 2021-05-06 DIAGNOSIS — I480 Paroxysmal atrial fibrillation: Secondary | ICD-10-CM | POA: Diagnosis not present

## 2021-05-06 LAB — CUP PACEART REMOTE DEVICE CHECK
Date Time Interrogation Session: 20230108232504
Implantable Pulse Generator Implant Date: 20200403

## 2021-05-15 NOTE — Progress Notes (Signed)
Carelink Summary Report / Loop Recorder 

## 2021-06-08 LAB — CUP PACEART REMOTE DEVICE CHECK
Date Time Interrogation Session: 20230210232204
Implantable Pulse Generator Implant Date: 20200403

## 2021-06-10 ENCOUNTER — Ambulatory Visit (INDEPENDENT_AMBULATORY_CARE_PROVIDER_SITE_OTHER): Payer: Medicare Other

## 2021-06-10 DIAGNOSIS — I480 Paroxysmal atrial fibrillation: Secondary | ICD-10-CM

## 2021-06-14 NOTE — Progress Notes (Signed)
Carelink Summary Report / Loop Recorder 

## 2021-07-07 NOTE — Progress Notes (Signed)
?Cardiology Office Note:   ? ?Date:  07/08/2021  ? ?ID:  Jasmine Dawson, DOB 1945-05-22, MRN [id number] ? ?PCP:  Lin Landsman, MD  ?[facility] HeartCare Providers ?Cardiologist:  Larae Grooms, MD ?Electrophysiologist:  Will Meredith Leeds, MD    ?Referring MD: Lin Landsman, MD  ? ?Chief Complaint:  F/u for AFib ?  ? ?Patient Profile: ?Paroxysmal supraventricular tachycardia ?10/2017: Admitted, required adenosine ?PVCs ?Paroxysmal atrial fibrillation ?Hypertension ?Hyperlipidemia ?Chronic kidney disease, stage IIIb ?Hx tricuspid regurgitation ?Hx pulmonary hypertension ?Prothrombin gene mutation ?Heterozygous factor V Leiden (family Hx of clotting disorder) ?Hx CVA in 07/2018 ?S/p ILR>> atrial fibrillation ? ?Prior CV Studies: ?Echocardiogram 07/29/2018 ?EF 60-65, moderate LVH, G1 DD, no RWMA, normal RVSF, mild AV calcification, trivial MR, mild TR, RVSP 38 ? ?Echocardiogram 03/22/2018 ?EF 65-70, PASP 33, mild TR ? ?Chest CTA 10/31/2017 ?Aortic atherosclerosis ? ? ?History of Present Illness:   ?Jasmine Dawson is a 76 y.o. female with the above problem list.  She was last seen in 6/22 by Melina Copa, PA-C.  She returns for follow-up.  She is here alone.  Since last seen, she has done well without chest pain, shortness of breath, syncope.  She does not have significant leg edema.  She has occasional palpitations.  She is able to do vagal maneuvers with improvement.  It has been several months since she had an episode of tachycardia.     ?   ?Past Medical History:  ?Diagnosis Date  ? CKD (chronic kidney disease), stage III ([place])   ? Heterozygous factor V Leiden mutation ([place])   ? Hyperlipidemia   ? Hypertension   ? Motion sickness   ? PAF (paroxysmal atrial fibrillation) ([place])   ? Prothrombin gene mutation ([place])   ? Pulmonary hypertension ([place])   ? a. 2D echo 11/01/17 showed EF 65-70%, grade 1 DD, mod-severe TR, moderately increased PASP 103mmHg. b. F/u Echo 07/2018 not seen.  ? PVC's (premature ventricular contractions)   ?  occasional PVCs  ? Sinus bradycardia   ? a. requiring reduction in beta blocker in 2020.  ? Stroke (cerebrum) ([place])   ? SVT (supraventricular tachycardia) ([place])   ? Tricuspid regurgitation   ? ?Current Medications: ?Current Meds  ?Medication Sig  ? acetaminophen (TYLENOL) 650 MG CR tablet Take 650 mg by mouth every 8 (eight) hours as needed for pain.  ? carboxymethylcellulose (REFRESH PLUS) 0.5 % SOLN Place 1 drop into both eyes daily as needed (for dry eyes).  ? Chlorphen-PE-Acetaminophen (CORICIDIN D COLD/FLU/SINUS PO) Take 2 tablets by mouth as needed (Allergies).  ? ELIQUIS 5 MG TABS tablet TAKE 1 TABLET(5 MG) BY MOUTH TWICE DAILY  ? famotidine (PEPCID) 20 MG tablet Take 20 mg by mouth daily.  ? rosuvastatin (CRESTOR) 40 MG tablet Take 1 tablet (40 mg total) by mouth daily at 6 PM.  ? triamterene-hydrochlorothiazide (MAXZIDE-25) 37.5-25 MG tablet Take 1 tablet by mouth daily.  ? [DISCONTINUED] atenolol (TENORMIN) 50 MG tablet Take 1 tablet (50 mg total) by mouth daily. Pt needs to make appt with provider for further refills - 1st attempt  ?  ?Allergies:   Eggs or egg-derived products, Orange fruit [citrus], Prednisone, Sulfa antibiotics, Tomato, Influenza vaccines, Aspartame and phenylalanine, Caffeine, and Shellfish allergy  ? ?Social History  ? ?Tobacco Use  ? Smoking status: Never  ? Smokeless tobacco: Never  ?Vaping Use  ? Vaping Use: Never used  ?Substance Use Topics  ? Alcohol use: No  ? Drug use: No  ?  ?Family  Hx: ?The patient's family history includes Diabetes in her mother; Heart attack in her father and mother. ? ?Review of Systems  ?Gastrointestinal:  Negative for hematochezia.  ?Genitourinary:  Negative for hematuria.   ? ?EKGs/Labs/Other Test Reviewed:   ? ?EKG:  EKG is not ordered today.  The ekg ordered today demonstrates n/a ? ?Recent Labs: ?10/01/2020: BUN 28; Creatinine, Ser 1.80; Platelets 254; Potassium 3.8; Sodium 140 ?10/10/2020: Hemoglobin 12.5  ? ?Labs obtained through [place] - personally  reviewed and interpreted: ?04/09/2020: Total cholesterol 157, HDL 70, LDL 69, triglycerides 103 ?05/21/2021: Hgb 12.8, creatinine one-point ? ?Risk Assessment/Calculations:   ? ?CHA2DS2-VASc Score = 7  ? This indicates a 11.2% annual risk of stroke. ?The patient's score is based upon: ?CHF History: 0 ?HTN History: 1 ?Diabetes History: 0 ?Stroke History: 2 ?Vascular Disease History: 1 ?Age Score: 2 ?Gender Score: 1 ?  ? ?    ?Physical Exam:   ? ?VS:  BP 128/62   Pulse 69   Ht 5' 3.5" (1.613 m)   Wt 180 lb 12.8 oz (82 kg)   SpO2 98%   BMI 31.52 kg/m?    ? ?Wt Readings from Last 3 Encounters:  ?07/08/21 180 lb 12.8 oz (82 kg)  ?10/10/20 188 lb (85.3 kg)  ?03/27/20 180 lb (81.6 kg)  ?  ?Constitutional:   ?   Appearance: Healthy appearance. Not in distress.  ?Neck:  ?   Vascular: No JVR. JVD normal.  ?Pulmonary:  ?   Effort: Pulmonary effort is normal.  ?   Breath sounds: No wheezing. No rales.  ?Cardiovascular:  ?   Normal rate. Regular rhythm. Normal S1. Normal S2.   ?   Murmurs: There is no murmur.  ?Edema: ?   Peripheral edema absent.  ?Abdominal:  ?   Palpations: Abdomen is soft.  ?Skin: ?   General: Skin is warm and dry.  ?Neurological:  ?   Mental Status: Alert and oriented to person, place and time.  ?   Cranial Nerves: Cranial nerves are intact.  ?  ?    ?ASSESSMENT & PLAN:   ?Paroxysmal atrial fibrillation ([place]) ?She has occasional episodes of tachycardia (AFib vs Supraventricular Tachycardia).  She is able to stop these with vagal maneuvers.  She is tolerating anticoagulation with Apixaban.  Recent Creatinine stable and Hgb normal.  As she is > 60 kg and < 61 years old, continue Apixaban 5 mg twice daily.  Continue Atenolol 50 mg once daily.  F/u in 6 mos.  ? ?Essential hypertension ?BP is well controlled.  Continue Atenolol 50 mg once daily, triamterene/HCTZ 37.5/25 mg daily. ? ?SVT (supraventricular tachycardia) ([place]) ?Continue beta-blocker therapy with atenolol 50 mg daily.  As noted, she knows how to  do vagal maneuvers. ? ?Chronic kidney disease, stage 3b ([place]) ?She is followed by Dr. Arty Baumgartner.  Most recent creatinine 1.89 ?  ?     ?   ?Dispo:  Return in about 6 months (around 01/08/2022) for Routine Follow Up w/ Dr. Irish Lack .  ? ?Medication Adjustments/Labs and Tests Ordered: ?Current medicines are reviewed at length with the patient today.  Concerns regarding medicines are outlined above.  ?Tests Ordered: ?No orders of the defined types were placed in this encounter. ? ?Medication Changes: ?Meds ordered this encounter  ?Medications  ? atenolol (TENORMIN) 50 MG tablet  ?  Sig: Take 1 tablet (50 mg total) by mouth daily.  ?  Dispense:  90 tablet  ?  Refill:  3  ? ?  Signed, ?[name], PA-C  ?07/08/2021 9:31 AM    ?[place] ?[address] [place], [place],   [address] ?Phone: [phone]; Fax: [phone]  ?

## 2021-07-08 ENCOUNTER — Other Ambulatory Visit: Payer: Self-pay

## 2021-07-08 ENCOUNTER — Encounter: Payer: Self-pay | Admitting: Physician Assistant

## 2021-07-08 ENCOUNTER — Ambulatory Visit (INDEPENDENT_AMBULATORY_CARE_PROVIDER_SITE_OTHER): Payer: Medicare Other | Admitting: Physician Assistant

## 2021-07-08 ENCOUNTER — Other Ambulatory Visit: Payer: Self-pay | Admitting: Interventional Cardiology

## 2021-07-08 VITALS — BP 128/62 | HR 69 | Ht 63.5 in | Wt 180.8 lb

## 2021-07-08 DIAGNOSIS — I10 Essential (primary) hypertension: Secondary | ICD-10-CM | POA: Diagnosis not present

## 2021-07-08 DIAGNOSIS — N1832 Chronic kidney disease, stage 3b: Secondary | ICD-10-CM

## 2021-07-08 DIAGNOSIS — I480 Paroxysmal atrial fibrillation: Secondary | ICD-10-CM

## 2021-07-08 DIAGNOSIS — I471 Supraventricular tachycardia: Secondary | ICD-10-CM | POA: Diagnosis not present

## 2021-07-08 MED ORDER — ATENOLOL 50 MG PO TABS: 50.0000 mg | ORAL_TABLET | Freq: Every day | ORAL | 3 refills | Status: DC

## 2021-07-08 NOTE — Assessment & Plan Note (Signed)
She has occasional episodes of tachycardia (AFib vs Supraventricular Tachycardia).  She is able to stop these with vagal maneuvers.  She is tolerating anticoagulation with Apixaban.  Recent Creatinine stable and Hgb normal.  As she is > 60 kg and < 78 years old, continue Apixaban 5 mg twice daily.  Continue Atenolol 50 mg once daily.  F/u in 6 mos.  ?

## 2021-07-08 NOTE — Assessment & Plan Note (Signed)
Continue beta-blocker therapy with atenolol 50 mg daily.  As noted, she knows how to do vagal maneuvers. ?

## 2021-07-08 NOTE — Patient Instructions (Addendum)
Medication Instructions:  ?Your physician recommends that you continue on your current medications as directed. Please refer to the Current Medication list given to you today. ? ?*If you need a refill on your cardiac medications before your next appointment, please call your pharmacy* ? ? ?Lab Work: ?None ordered ? ?If you have labs (blood work) drawn today and your tests are completely normal, you will receive your results only by: ?MyChart Message (if you have MyChart) OR ?A paper copy in the mail ?If you have any lab test that is abnormal or we need to change your treatment, we will call you to review the results. ? ? ?Testing/Procedures: ?None ordered ? ? ?Follow-Up: ?At [facility], you and your health needs are our priority.  As part of our continuing mission to provide you with exceptional heart care, we have created designated Provider Care Teams.  These Care Teams include your primary Cardiologist (physician) and Advanced Practice Providers (APPs -  Physician Assistants and Nurse Practitioners) who all work together to provide you with the care you need, when you need it. ? ?We recommend signing up for the patient portal called "MyChart".  Sign up information is provided on this After Visit Summary.  MyChart is used to connect with patients for Virtual Visits (Telemedicine).  Patients are able to view lab/test results, encounter notes, upcoming appointments, etc.  Non-urgent messages can be sent to your provider as well.   ?To learn more about what you can do with MyChart, go to [email or web address].   ? ?Your next appointment:   ?6 month(s)  01/24/22 ARRIVE AT 8:05 ? ?The format for your next appointment:   ?In Person ? ?Provider:   ?Lance Muss, MD   ? ? ?Other Instructions ?  ?

## 2021-07-08 NOTE — Assessment & Plan Note (Signed)
BP is well controlled.  Continue Atenolol 50 mg once daily, triamterene/HCTZ 37.5/25 mg daily. ?

## 2021-07-08 NOTE — Assessment & Plan Note (Signed)
She is followed by Dr. Abel Presto.  Most recent creatinine 1.89 ?

## 2021-07-15 ENCOUNTER — Ambulatory Visit (INDEPENDENT_AMBULATORY_CARE_PROVIDER_SITE_OTHER): Payer: Medicare Other

## 2021-07-15 DIAGNOSIS — I480 Paroxysmal atrial fibrillation: Secondary | ICD-10-CM | POA: Diagnosis not present

## 2021-07-16 LAB — CUP PACEART REMOTE DEVICE CHECK
Date Time Interrogation Session: 20230319231712
Implantable Pulse Generator Implant Date: 20200403

## 2021-07-25 NOTE — Progress Notes (Signed)
Carelink Summary Report / Loop Recorder 

## 2021-08-07 ENCOUNTER — Other Ambulatory Visit: Payer: Self-pay | Admitting: Physician Assistant

## 2021-08-07 NOTE — Telephone Encounter (Signed)
Pt last saw Tereso Newcomer, PA on 07/08/21, last labs 10/01/20 Creat 1.80, age 76, weight 82kg, based on specified criteria pt is on appropriate dosage of Eliquis 5mg  BID for afib.   Will refill rx.  ?

## 2021-08-19 ENCOUNTER — Ambulatory Visit (INDEPENDENT_AMBULATORY_CARE_PROVIDER_SITE_OTHER): Payer: Medicare Other

## 2021-08-19 DIAGNOSIS — I480 Paroxysmal atrial fibrillation: Secondary | ICD-10-CM

## 2021-08-19 LAB — CUP PACEART REMOTE DEVICE CHECK
Date Time Interrogation Session: 20230421230949
Implantable Pulse Generator Implant Date: 20200403

## 2021-09-04 NOTE — Progress Notes (Signed)
Carelink Summary Report / Loop Recorder 

## 2021-09-19 ENCOUNTER — Ambulatory Visit (INDEPENDENT_AMBULATORY_CARE_PROVIDER_SITE_OTHER): Payer: Medicare Other

## 2021-09-19 DIAGNOSIS — I480 Paroxysmal atrial fibrillation: Secondary | ICD-10-CM | POA: Diagnosis not present

## 2021-09-19 LAB — CUP PACEART REMOTE DEVICE CHECK
Date Time Interrogation Session: 20230524235239
Implantable Pulse Generator Implant Date: 20200403

## 2021-10-01 NOTE — Progress Notes (Signed)
Carelink Summary Report / Loop Recorder 

## 2021-10-08 ENCOUNTER — Telehealth: Payer: Self-pay | Admitting: Interventional Cardiology

## 2021-10-08 NOTE — Telephone Encounter (Signed)
Reviewed with PCP.  The patient was there and asked her to refill her atenolol.  She stated the last time she refilled the medication she was called about a discrepancy re: atenolol and metoprolol.    I reviewed with Dr. Ayesha Rumpf that on 07/08/21 [name], PA-C atenolol 50 mg daily was sent to [facility].  PCP appreciative for assistance.

## 2021-10-08 NOTE — Telephone Encounter (Signed)
Dr. Ayesha Rumpf wanting to speak with a nurse in regards to notes on pt as well as medication clarification. Please advise Call transferred

## 2021-10-22 ENCOUNTER — Ambulatory Visit (INDEPENDENT_AMBULATORY_CARE_PROVIDER_SITE_OTHER): Payer: Medicare Other

## 2021-10-22 DIAGNOSIS — I480 Paroxysmal atrial fibrillation: Secondary | ICD-10-CM

## 2021-10-22 LAB — CUP PACEART REMOTE DEVICE CHECK
Date Time Interrogation Session: 20230626232800
Implantable Pulse Generator Implant Date: 20200403

## 2021-11-13 NOTE — Progress Notes (Signed)
Carelink Summary Report / Loop Recorder 

## 2021-11-25 ENCOUNTER — Ambulatory Visit (INDEPENDENT_AMBULATORY_CARE_PROVIDER_SITE_OTHER): Payer: Medicare Other

## 2021-11-25 DIAGNOSIS — I480 Paroxysmal atrial fibrillation: Secondary | ICD-10-CM

## 2021-11-25 LAB — CUP PACEART REMOTE DEVICE CHECK
Date Time Interrogation Session: 20230729232751
Implantable Pulse Generator Implant Date: 20200403

## 2021-12-12 ENCOUNTER — Encounter (HOSPITAL_COMMUNITY): Payer: Self-pay | Admitting: Emergency Medicine

## 2021-12-12 ENCOUNTER — Emergency Department (HOSPITAL_COMMUNITY)
Admission: EM | Admit: 2021-12-12 | Discharge: 2021-12-12 | Disposition: A | Discharge status: Home / Self Care | Payer: Medicare Other | Attending: Emergency Medicine | Admitting: Emergency Medicine

## 2021-12-12 ENCOUNTER — Emergency Department (HOSPITAL_COMMUNITY): Payer: Medicare Other

## 2021-12-12 DIAGNOSIS — R1012 Left upper quadrant pain: Secondary | ICD-10-CM | POA: Diagnosis present

## 2021-12-12 DIAGNOSIS — R748 Abnormal levels of other serum enzymes: Secondary | ICD-10-CM | POA: Insufficient documentation

## 2021-12-12 DIAGNOSIS — K7689 Other specified diseases of liver: Secondary | ICD-10-CM | POA: Insufficient documentation

## 2021-12-12 DIAGNOSIS — Z7901 Long term (current) use of anticoagulants: Secondary | ICD-10-CM | POA: Diagnosis not present

## 2021-12-12 DIAGNOSIS — D72829 Elevated white blood cell count, unspecified: Secondary | ICD-10-CM | POA: Diagnosis not present

## 2021-12-12 DIAGNOSIS — I480 Paroxysmal atrial fibrillation: Secondary | ICD-10-CM | POA: Insufficient documentation

## 2021-12-12 DIAGNOSIS — Z79899 Other long term (current) drug therapy: Secondary | ICD-10-CM | POA: Insufficient documentation

## 2021-12-12 DIAGNOSIS — N189 Chronic kidney disease, unspecified: Secondary | ICD-10-CM | POA: Diagnosis not present

## 2021-12-12 DIAGNOSIS — R944 Abnormal results of kidney function studies: Secondary | ICD-10-CM | POA: Diagnosis not present

## 2021-12-12 DIAGNOSIS — R197 Diarrhea, unspecified: Secondary | ICD-10-CM | POA: Diagnosis not present

## 2021-12-12 DIAGNOSIS — R112 Nausea with vomiting, unspecified: Secondary | ICD-10-CM | POA: Diagnosis not present

## 2021-12-12 DIAGNOSIS — I129 Hypertensive chronic kidney disease with stage 1 through stage 4 chronic kidney disease, or unspecified chronic kidney disease: Secondary | ICD-10-CM | POA: Diagnosis not present

## 2021-12-12 DIAGNOSIS — R110 Nausea: Secondary | ICD-10-CM

## 2021-12-12 DIAGNOSIS — K769 Liver disease, unspecified: Secondary | ICD-10-CM

## 2021-12-12 LAB — COMPREHENSIVE METABOLIC PANEL
ALT: 14 U/L (ref 0–44)
AST: 31 U/L (ref 15–41)
Albumin: 3.9 g/dL (ref 3.5–5.0)
Alkaline Phosphatase: 80 U/L (ref 38–126)
Anion gap: 8 (ref 5–15)
BUN: 41 mg/dL — ABNORMAL HIGH (ref 8–23)
CO2: 24 mmol/L (ref 22–32)
Calcium: 9.4 mg/dL (ref 8.9–10.3)
Chloride: 109 mmol/L (ref 98–111)
Creatinine, Ser: 2.3 mg/dL — ABNORMAL HIGH (ref 0.44–1.00)
GFR, Estimated: 21 mL/min — ABNORMAL LOW (ref >60)
Glucose, Bld: 131 mg/dL — ABNORMAL HIGH (ref 70–99)
Potassium: 3.6 mmol/L (ref 3.5–5.1)
Sodium: 141 mmol/L (ref 135–145)
Total Bilirubin: 0.7 mg/dL (ref 0.3–1.2)
Total Protein: 8 g/dL (ref 6.5–8.1)

## 2021-12-12 LAB — URINALYSIS, ROUTINE W REFLEX MICROSCOPIC
Bilirubin Urine: NEGATIVE
Glucose, UA: NEGATIVE mg/dL
Ketones, ur: NEGATIVE mg/dL
Nitrite: NEGATIVE
Protein, ur: 30 mg/dL — AB
Specific Gravity, Urine: 1.014 (ref 1.005–1.030)
pH: 5 (ref 5.0–8.0)

## 2021-12-12 LAB — CBC
HCT: 38.1 % (ref 36.0–46.0)
Hemoglobin: 12.4 g/dL (ref 12.0–15.0)
MCH: 29.2 pg (ref 26.0–34.0)
MCHC: 32.5 g/dL (ref 30.0–36.0)
MCV: 89.6 fL (ref 80.0–100.0)
Platelets: 229 10*3/uL (ref 150–400)
RBC: 4.25 MIL/uL (ref 3.87–5.11)
RDW: 13.9 % (ref 11.5–15.5)
WBC: 12.5 10*3/uL — ABNORMAL HIGH (ref 4.0–10.5)
nRBC: 0 % (ref 0.0–0.2)

## 2021-12-12 LAB — LIPASE, BLOOD: Lipase: 59 U/L — ABNORMAL HIGH (ref 11–51)

## 2021-12-12 MED ORDER — SODIUM CHLORIDE 0.9 % IV BOLUS
500.0000 mL | Freq: Once | INTRAVENOUS | Status: AC
  Administered 2021-12-12: 500 mL via INTRAVENOUS

## 2021-12-12 MED ORDER — ONDANSETRON 4 MG PO TBDP: 4.0000 mg | ORAL_TABLET | Freq: Three times a day (TID) | ORAL | 0 refills | Status: DC | PRN

## 2021-12-12 NOTE — ED Notes (Signed)
Pt verbalized understanding of d/c instructions, meds, and followup care. Denies questions. VSS, no distress noted. Steady gait to exit with all belongings.  ?

## 2021-12-12 NOTE — Discharge Instructions (Signed)
Zofran as needed as prescribed for nausea and vomiting. Follow up with your doctor for recheck and to discuss MRI of your liver.

## 2021-12-12 NOTE — ED Notes (Signed)
Pt to CT

## 2021-12-12 NOTE — ED Notes (Signed)
First attempt unsuccessful, pt has label specimen cup

## 2021-12-12 NOTE — ED Triage Notes (Signed)
Patient BIB [facility] w/  lower bilateral abdominal pain, n/v/d since 2 am this morning. Patient reports taking a new antibiotic yesterday- cefdinir and since taking this it has upset her stomach. Patient felt normal prior to taking it.   VSS.  EMS administered 4 mg zofran, 400 ml ns.

## 2021-12-12 NOTE — ED Provider Notes (Signed)
[facility] EMERGENCY DEPARTMENT Provider Note   CSN: [id number] Arrival date & time: 12/12/21  7564     History  Chief Complaint  Patient presents with   Abdominal Pain   Nausea   Emesis    Jasmine Dawson is a 76 y.o. female.  76 year old female with past medical history of chronic kidney disease, hypertension, hyperlipidemia, SVT, pulmonary hypertension, CVA, paroxysmal A-fib on Eliquis.  Brought in by EMS from home with complaint of abdominal cramping with diarrhea and nausea with chills.  Patient took an unknown antibiotic (possibly cefdinir) for sinus infection with her first dose last night, woke up around 130 this morning with severe abdominal cramping and 4 loose, nonbloody stools.  States she has been nauseous but not vomiting.  Feels like she has had a fever but has not measured it.  Denies changes in bladder habits, chest pain, shortness of breath.       Home Medications Prior to Admission medications   Medication Sig Start Date End Date Taking? Authorizing Provider  atenolol (TENORMIN) 50 MG tablet Take 1 tablet (50 mg total) by mouth daily. 07/08/21  Yes Weaver, Scott T, PA-C  Calcium Carbonate-Vitamin D (CALCIUM-VITAMIN D) 600-3.125 MG-MCG TABS Take 1 tablet by mouth daily.   Yes [provider]  carboxymethylcellulose (REFRESH PLUS) 0.5 % SOLN Place 1 drop into both eyes daily as needed (for dry eyes).   Yes [provider]  cefdinir (OMNICEF) 300 MG capsule Take 300 mg by mouth every 12 (twelve) hours. 12/06/21  Yes [provider]  Chlorphen-PE-Acetaminophen (CORICIDIN D COLD/FLU/SINUS PO) Take 2 tablets by mouth as needed (Allergies).   Yes [provider]  ELIQUIS 5 MG TABS tablet TAKE 1 TABLET(5 MG) BY MOUTH TWICE DAILY 08/07/21  Yes Camnitz, Will Daphine Deutscher, MD  famotidine (PEPCID) 20 MG tablet Take 20 mg by mouth daily.   Yes [provider]  fexofenadine-pseudoephedrine (ALLEGRA-D 24) 180-240 MG 24 hr  tablet Take 1 tablet by mouth daily as needed (allergies).   Yes [provider]  meclizine (ANTIVERT) 12.5 MG tablet Take 25 mg by mouth 3 (three) times daily as needed for dizziness. 12/06/21  Yes [provider]  ondansetron (ZOFRAN-ODT) 4 MG disintegrating tablet Take 1 tablet (4 mg total) by mouth every 8 (eight) hours as needed for nausea or vomiting. 12/12/21  Yes Jeannie Fend, PA-C  rosuvastatin (CRESTOR) 40 MG tablet Take 1 tablet (40 mg total) by mouth daily at 6 PM. 07/31/18  Yes Agyei, Hermina Staggers, MD  triamterene-hydrochlorothiazide (DYAZIDE) 37.5-25 MG capsule Take 1 capsule by mouth daily. 12/07/21  Yes [provider]  methylPREDNISolone (MEDROL DOSEPAK) 4 MG TBPK tablet Take by mouth as directed. 12/06/21   [provider]      Allergies    Eggs or egg-derived products, Orange fruit [citrus], Prednisone, Sulfa antibiotics, Tomato, Influenza vaccines, Aspartame and phenylalanine, Caffeine, and Shellfish allergy    Review of Systems   Review of Systems Negative except as per HPI Physical Exam Updated Vital Signs BP (!) 134/52   Pulse (!) 54   Temp 98 F (36.7 C) (Oral)   Resp 16   SpO2 99%  Physical Exam Vitals and nursing note reviewed.  Constitutional:      General: She is not in acute distress.    Appearance: She is well-developed. She is not diaphoretic.  HENT:     Head: Normocephalic and atraumatic.  Cardiovascular:     Rate and Rhythm: Normal rate and  regular rhythm.     Heart sounds: Normal heart sounds.  Pulmonary:     Effort: Pulmonary effort is normal.     Breath sounds: Normal breath sounds.  Abdominal:     Palpations: Abdomen is soft.     Tenderness: There is abdominal tenderness in the left upper quadrant and left lower quadrant.  Musculoskeletal:     Right lower leg: No edema.     Left lower leg: No edema.  Skin:    General: Skin is warm and dry.     Coloration: Skin is not pale.     Findings: No rash.   Neurological:     Mental Status: She is alert and oriented to person, place, and time.  Psychiatric:        Behavior: Behavior normal.     ED Results / Procedures / Treatments   Labs (all labs ordered are listed, but only abnormal results are displayed) Labs Reviewed  LIPASE, BLOOD - Abnormal; Notable for the following components:      Result Value   Lipase 59 (*)    All other components within normal limits  COMPREHENSIVE METABOLIC PANEL - Abnormal; Notable for the following components:   Glucose, Bld 131 (*)    BUN 41 (*)    Creatinine, Ser 2.30 (*)    GFR, Estimated 21 (*)    All other components within normal limits  CBC - Abnormal; Notable for the following components:   WBC 12.5 (*)    All other components within normal limits  URINALYSIS, ROUTINE W REFLEX MICROSCOPIC - Abnormal; Notable for the following components:   APPearance CLOUDY (*)    Hgb urine dipstick SMALL (*)    Protein, ur 30 (*)    Leukocytes,Ua TRACE (*)    Bacteria, UA MANY (*)    All other components within normal limits    EKG None  Radiology CT Abdomen Pelvis Wo Contrast  Result Date: 12/12/2021 CLINICAL DATA:  Acute generalized abdominal pain. EXAM: CT ABDOMEN AND PELVIS WITHOUT CONTRAST TECHNIQUE: Multidetector CT imaging of the abdomen and pelvis was performed following the standard protocol without IV contrast. RADIATION DOSE REDUCTION: This exam was performed according to the departmental dose-optimization program which includes automated exposure control, adjustment of the mA and/or kV according to patient size and/or use of iterative reconstruction technique. COMPARISON:  August 26, 2018. FINDINGS: Lower chest: No acute abnormality. Hepatobiliary: Minimal cholelithiasis is noted. No biliary dilatation is noted. 7.4 x 5.3 cm bilobed cystic abnormality is seen involving the left hepatic lobe which is significantly enlarged compared to prior exam. Pancreas: Unremarkable. No pancreatic ductal  dilatation or surrounding inflammatory changes. Spleen: Normal in size without focal abnormality. Adrenals/Urinary Tract: Adrenal glands are unremarkable. Kidneys are normal, without renal calculi, focal lesion, or hydronephrosis. Bladder is unremarkable. Stomach/Bowel: Stomach is within normal limits. Appendix appears normal. No evidence of bowel wall thickening, distention, or inflammatory changes. Vascular/Lymphatic: Aortic atherosclerosis. No enlarged abdominal or pelvic lymph nodes. Reproductive: Uterus and bilateral adnexa are unremarkable. Other: No abdominal wall hernia or abnormality. No abdominopelvic ascites. Musculoskeletal: No acute or significant osseous findings. IMPRESSION: Minimal cholelithiasis without evidence of cholecystitis. 7.4 x 5.3 cm bilobed cystic abnormality is seen involving the left hepatic lobe which is significantly enlarged compared to prior exam. Given the significant increase in size as well as slightly complex nature, when the patient is clinically stable and able to follow directions and hold their breath (preferably as an outpatient) further evaluation with dedicated abdominal MRI should  be considered. Electronically Signed   By: Lupita Raider M.D.   On: 12/12/2021 14:50    Procedures Procedures    Medications Ordered in ED Medications  sodium chloride 0.9 % bolus 500 mL (0 mLs Intravenous Stopped 12/12/21 1425)    ED Course/ Medical Decision Making/ A&P                           Medical Decision Making Amount and/or Complexity of Data Reviewed Labs: ordered. Radiology: ordered.  Risk Prescription drug management.   This patient presents to the ED for concern of abdominal cramping with diarrhea and nausea, this involves an extensive number of treatment options, and is a complaint that carries with it a high risk of complications and morbidity.  The differential diagnosis includes but not limited to peptic ulcer disease, diverticulitis, colitis, reaction  to antibiotic   Co morbidities that complicate the patient evaluation  Hypertension, hyperlipidemia, SVT, pulmonary hypertension, CKD, PVCs, paroxysmal A-fib (on Eliquis)   Additional history obtained:  Additional history obtained from husband at bedside who contributes to history as above External records from outside source obtained and reviewed including review of records, patient's PCP records are not available in the system for review   Lab Tests:  I Ordered, and personally interpreted labs.  The pertinent results include: CBC with mild leukocytosis, white count 12.5.  Lipase minimally elevated at 59.  CMP with creatinine elevated 2.3, similar to prior on file.  Urinalysis contaminated with small hemoglobin, protein and trace leukocytes without urinary symptoms   Imaging Studies ordered:  I ordered imaging studies including CT abdomen pelvis without contrast I independently visualized and interpreted imaging which showed cystic abnormality in left hepatic lobe.  Radiology comments has a significantly changed from prior on file, recommending outpatient MRI. I agree with the radiologist interpretation   Consultations Obtained:  I requested consultation with the ER attending, Dr. Lynelle Doctor,  and discussed lab and imaging findings as well as pertinent plan - they recommend: Outpatient follow-up MRI for cystic structure in the liver as recommended by radiology   Problem List / ED Course / Critical interventions / Medication management  76 year old female presents with complaint of left-sided abdominal pain with 4 episodes of loose nonbloody stools and nausea.  This started after she took her first dose of cefdinir yesterday evening which was she was prescribed for a sinus infection.  On exam, has left-sided abdominal tenderness, no right-sided tenderness.  Labs reviewed, CT obtained and patient was given IV fluids.  She reports improvement in her symptoms, no longer feeling nauseous, no  further diarrhea.  Discussed CT findings, agreeable to follow-up with PCP for further evaluation.  Regarding her antibiotic for her sinus infection, recommend contact her PCP discussed other options including Augmentin however this would likely also cause abdominal discomfort and diarrhea. I ordered medication including IV fluids for mildly elevated creatinine following several episodes of diarrhea Reevaluation of the patient after these medicines showed that the patient improved I have reviewed the patients home medicines and have made adjustments as needed   Social Determinants of Health:  Lives with family, has PCP   Test / Admission - Considered:  Patient stable for discharge, can follow-up with PCP for recheck, further management of her sinus infection and follow-up imaging of her cystic structure in her liver         Final Clinical Impression(s) / ED Diagnoses Final diagnoses:  Left upper quadrant abdominal pain  Nausea  Diarrhea, unspecified type  Liver lesion    Rx / DC Orders ED Discharge Orders          Ordered    ondansetron (ZOFRAN-ODT) 4 MG disintegrating tablet  Every 8 hours PRN        12/12/21 1621              Jeannie Fend, PA-C 12/12/21 1733    Linwood Dibbles, MD 12/14/21 1252

## 2021-12-12 NOTE — ED Notes (Signed)
Pharm tech at [facility] ?

## 2021-12-26 NOTE — Progress Notes (Signed)
Carelink Summary Report / Loop Recorder 

## 2021-12-30 LAB — CUP PACEART REMOTE DEVICE CHECK
Date Time Interrogation Session: 20230831234911
Implantable Pulse Generator Implant Date: 20200403

## 2021-12-31 ENCOUNTER — Ambulatory Visit (INDEPENDENT_AMBULATORY_CARE_PROVIDER_SITE_OTHER): Payer: Medicare Other

## 2021-12-31 DIAGNOSIS — I480 Paroxysmal atrial fibrillation: Secondary | ICD-10-CM | POA: Diagnosis not present

## 2022-01-14 ENCOUNTER — Other Ambulatory Visit: Payer: Self-pay | Admitting: Family Medicine

## 2022-01-14 DIAGNOSIS — K769 Liver disease, unspecified: Secondary | ICD-10-CM

## 2022-01-14 DIAGNOSIS — R160 Hepatomegaly, not elsewhere classified: Secondary | ICD-10-CM

## 2022-01-23 NOTE — Progress Notes (Signed)
Carelink Summary Report / Loop Recorder 

## 2022-01-23 NOTE — Progress Notes (Signed)
Cardiology Office Note   Date:  01/24/2022   ID:  Jasmine Dawson, Jasmine Dawson 27-Nov-1945, MRN [id number]  PCP:  Lin Landsman, MD    No chief complaint on file.  AFib  Wt Readings from Last 3 Encounters:  01/24/22 172 lb 12.8 oz (78.4 kg)  07/08/21 180 lb 12.8 oz (82 kg)  10/10/20 188 lb (85.3 kg)       History of Present Illness: Jasmine Dawson is a 76 y.o. female  with AFib and prior CVA.  Prior cardiac testing as follows: "Paroxysmal supraventricular tachycardia 10/2017: Admitted, required adenosine PVCs Paroxysmal atrial fibrillation Hypertension Hyperlipidemia Chronic kidney disease, stage IIIb Hx tricuspid regurgitation Hx pulmonary hypertension Prothrombin gene mutation Heterozygous factor V Leiden (family Hx of clotting disorder) Hx CVA in 07/2018 S/p ILR>> atrial fibrillation   Prior CV Studies: Echocardiogram 07/29/2018 EF 60-65, moderate LVH, G1 DD, no RWMA, normal RVSF, mild AV calcification, trivial MR, mild TR, RVSP 38   Echocardiogram 03/22/2018 EF 65-70, PASP 33, mild TR   Chest CTA 10/31/2017 Aortic atherosclerosis" In the past, she has been able to use vagal maneuvers to break her SVT.  Oldest son passed away in 17-Jun-2019.   Denies : Chest pain. Dizziness. Leg edema. Nitroglycerin use. Orthopnea. Paroxysmal nocturnal dyspnea. Shortness of breath. Syncope.    Rare palpitations lasting only seconds.  Deep breathing will help them resolve.  Stressful situations are the trigger.  Lives in 3[address] sq foot home.  Walking stairs a lot in the house has helped with weight loss.  Still walking her dog regularly.   Past Medical History:  Diagnosis Date   CKD (chronic kidney disease), stage III (HCC)    Heterozygous factor V Leiden mutation (HCC)    Hyperlipidemia    Hypertension    Motion sickness    PAF (paroxysmal atrial fibrillation) (HCC)    Prothrombin gene mutation ([place])    Pulmonary hypertension ([place])    a. 2D echo 11/01/17 showed EF 65-70%, grade 1  DD, mod-severe TR, moderately increased PASP 86mHg. b. F/u Echo 07/2018 not seen.   PVC's (premature ventricular contractions)    occasional PVCs   Sinus bradycardia    a. requiring reduction in beta blocker in 202-19-2020   Stroke (cerebrum) (HCC)    SVT (supraventricular tachycardia) (HCC)    Tricuspid regurgitation     Past Surgical History:  Procedure Laterality Date   HYSTEROSCOPY WITH D & C  11/07/2011   Procedure: DILATATION AND CURETTAGE /HYSTEROSCOPY;  Surgeon: LLahoma Crocker MD;  [facility];  Service: Gynecology;  Laterality: N/A;  endometrial polypectomy   LOOP RECORDER INSERTION N/A 07/30/2018   Procedure: LOOP RECORDER INSERTION;  Surgeon: CConstance Haw MD;  [facility];  Service: Cardiovascular;  Laterality: N/A;   TUBAL LIGATION  1977     Current Outpatient Medications  Medication Sig Dispense Refill   atenolol (TENORMIN) 50 MG tablet Take 1 tablet (50 mg total) by mouth daily. 90 tablet 3   Calcium Carbonate-Vitamin D (CALCIUM-VITAMIN D) 600-3.125 MG-MCG TABS Take 1 tablet by mouth daily.     carboxymethylcellulose (REFRESH PLUS) 0.5 % SOLN Place 1 drop into both eyes daily as needed (for dry eyes).     Chlorphen-PE-Acetaminophen (CORICIDIN D COLD/FLU/SINUS PO) Take 2 tablets by mouth as needed (Allergies).     ELIQUIS 5 MG TABS tablet TAKE 1 TABLET(5 MG) BY MOUTH TWICE DAILY 60 tablet 5   famotidine (PEPCID) 20 MG tablet Take 20 mg by mouth  daily.     fexofenadine-pseudoephedrine (ALLEGRA-D 24) 180-240 MG 24 hr tablet Take 1 tablet by mouth daily as needed (allergies).     meclizine (ANTIVERT) 12.5 MG tablet Take 25 mg by mouth 3 (three) times daily as needed for dizziness.     methylPREDNISolone (MEDROL DOSEPAK) 4 MG TBPK tablet Take by mouth as directed.     ondansetron (ZOFRAN-ODT) 4 MG disintegrating tablet Take 1 tablet (4 mg total) by mouth every 8 (eight) hours as needed for nausea or vomiting. 12 tablet 0   rosuvastatin (CRESTOR) 40  MG tablet Take 1 tablet (40 mg total) by mouth daily at 6 PM. 30 tablet 3   triamterene-hydrochlorothiazide (DYAZIDE) 37.5-25 MG capsule Take 1 capsule by mouth daily.     No current facility-administered medications for this visit.    Allergies:   Cefdinir, Eggs or egg-derived products, Orange fruit [citrus], Prednisone, Sulfa antibiotics, Tomato, Influenza vaccines, Aspartame and phenylalanine, Caffeine, and Shellfish allergy    Social History:  The patient  reports that she has never smoked. She has never used smokeless tobacco. She reports that she does not drink alcohol and does not use drugs.   Family History:  The patient's family history includes Diabetes in her mother; Heart attack in her father and mother.    ROS:  Please see the history of present illness.   Otherwise, review of systems are positive for stress.   All other systems are reviewed and negative.    PHYSICAL EXAM: VS:  BP 126/62   Pulse 65   Ht 5' 1.5" (1.562 m)   Wt 172 lb 12.8 oz (78.4 kg)   SpO2 99%   BMI 32.12 kg/m  , BMI Body mass index is 32.12 kg/m. GEN: Well nourished, well developed, in no acute distress HEENT: normal Neck: no JVD, carotid bruits, or masses Cardiac: RRR; no murmurs, rubs, or gallops,no edema  Respiratory:  clear to auscultation bilaterally, normal work of breathing GI: soft, nontender, nondistended, + BS MS: no deformity or atrophy Skin: warm and dry, no rash Neuro:  Strength and sensation are intact Psych: euthymic mood, full affect   EKG:   The ekg ordered today demonstrates NSR, prolonged PR interval, no ST changes   Recent Labs: 12/12/2021: ALT 14; BUN 41; Creatinine, Ser 2.30; Hemoglobin 12.4; Platelets 229; Potassium 3.6; Sodium 141   Lipid Panel    Component Value Date/Time   CHOL 157 04/09/2020 0915   TRIG 103 04/09/2020 0915   HDL 70 04/09/2020 0915   CHOLHDL 2.2 04/09/2020 0915   CHOLHDL 4.0 07/29/2018 0553   VLDL 19 07/29/2018 0553   LDLCALC 69 04/09/2020  0915     Other studies Reviewed: Additional studies/ records that were reviewed today with results demonstrating: LDL 69 in 12/21. Labs reviewed   ASSESSMENT AND PLAN:  Atrial fibrillation: No sx of palpitations.  Normal TSH in 2021.  Hypertension: The current medical regimen is effective;  continue present plan and medications.  Checks at home.  SVT: Sx controlled.  CKD 3B: Followed by nephrology (Dr. Marval Regal).  Cr 2.3 in August 2023.  Avoid nephrotoxins.  She was dehydrated at the time of the 2.3.  Be met to be rechecked by nephrology.  Hopefully will be back closer to her baseline around 1.6. 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  Due to the change in kidney function, may need to decrease Eliquis to 2.5 mg twice a day.  Her age keeps her at the 5 mg twice  a day.  She is having some issues with increased cost towards the end of the year.  Monitor for bleeding.  I would have a low threshold to allow her to change to the 2.5 mg twice a day.  We discussed this at length.  She may do this on her own at least temporarily. Hyperlipidemia: Continue rosuvastatin 40 mg daily.  Labs to be checked with primary care doctor.   Current medicines are reviewed at length with the patient today.  The patient concerns regarding her medicines were addressed.  The following changes have been made:  No change  Labs/ tests ordered today include:  No orders of the defined types were placed in this encounter.   Recommend 150 minutes/week of aerobic exercise Low fat, low carb, high fiber diet recommended  Disposition:   FU in 1 year   Signed, Larae Grooms, MD  01/24/2022 8:28 AM    [facility] [place], [place],   [address] Phone: [phone]; Fax: [phone]

## 2022-01-24 ENCOUNTER — Encounter: Payer: Self-pay | Admitting: Interventional Cardiology

## 2022-01-24 ENCOUNTER — Ambulatory Visit: Payer: Medicare Other | Attending: Interventional Cardiology | Admitting: Interventional Cardiology

## 2022-01-24 VITALS — BP 126/62 | HR 65 | Ht 61.5 in | Wt 172.8 lb

## 2022-01-24 DIAGNOSIS — I471 Supraventricular tachycardia: Secondary | ICD-10-CM | POA: Insufficient documentation

## 2022-01-24 DIAGNOSIS — I10 Essential (primary) hypertension: Secondary | ICD-10-CM | POA: Diagnosis present

## 2022-01-24 DIAGNOSIS — D6869 Other thrombophilia: Secondary | ICD-10-CM | POA: Insufficient documentation

## 2022-01-24 DIAGNOSIS — N1832 Chronic kidney disease, stage 3b: Secondary | ICD-10-CM | POA: Insufficient documentation

## 2022-01-24 DIAGNOSIS — I480 Paroxysmal atrial fibrillation: Secondary | ICD-10-CM | POA: Diagnosis present

## 2022-01-24 NOTE — Patient Instructions (Signed)
Medication Instructions:  Your physician recommends that you continue on your current medications as directed. Please refer to the Current Medication list given to you today.  *If you need a refill on your cardiac medications before your next appointment, please call your pharmacy*   Lab Work: none If you have labs (blood work) drawn today and your tests are completely normal, you will receive your results only by: MyChart Message (if you have MyChart) OR A paper copy in the mail If you have any lab test that is abnormal or we need to change your treatment, we will call you to review the results.   Testing/Procedures: none   Follow-Up: At [facility], you and your health needs are our priority.  As part of our continuing mission to provide you with exceptional heart care, we have created designated Provider Care Teams.  These Care Teams include your primary Cardiologist (physician) and Advanced Practice Providers (APPs -  Physician Assistants and Nurse Practitioners) who all work together to provide you with the care you need, when you need it.  We recommend signing up for the patient portal called "MyChart".  Sign up information is provided on this After Visit Summary.  MyChart is used to connect with patients for Virtual Visits (Telemedicine).  Patients are able to view lab/test results, encounter notes, upcoming appointments, etc.  Non-urgent messages can be sent to your provider as well.   To learn more about what you can do with MyChart, go to [email or web address].    Your next appointment:   12 month(s)  The format for your next appointment:   In Person  Provider:   Jayadeep Varanasi, MD     Other Instructions    Important Information About Sugar       

## 2022-01-29 LAB — CUP PACEART REMOTE DEVICE CHECK
Date Time Interrogation Session: 20231003233743
Implantable Pulse Generator Implant Date: 20200403

## 2022-02-03 ENCOUNTER — Ambulatory Visit (INDEPENDENT_AMBULATORY_CARE_PROVIDER_SITE_OTHER): Payer: Medicare Other

## 2022-02-03 ENCOUNTER — Other Ambulatory Visit: Payer: Self-pay | Admitting: Cardiology

## 2022-02-03 DIAGNOSIS — I480 Paroxysmal atrial fibrillation: Secondary | ICD-10-CM

## 2022-02-03 NOTE — Telephone Encounter (Signed)
Prescription refill request for Eliquis received. Indication:Afib Last office visit:9/23 Scr:2.3 Age: 76 Weight:78.4  kg  Prescription refilled

## 2022-02-04 ENCOUNTER — Ambulatory Visit
Admission: RE | Admit: 2022-02-04 | Discharge: 2022-02-04 | Disposition: A | Discharge status: Home / Self Care | Payer: Medicare Other | Source: Ambulatory Visit | Attending: Family Medicine | Admitting: Family Medicine

## 2022-02-04 DIAGNOSIS — R160 Hepatomegaly, not elsewhere classified: Secondary | ICD-10-CM

## 2022-02-04 DIAGNOSIS — K769 Liver disease, unspecified: Secondary | ICD-10-CM

## 2022-02-04 MED ORDER — GADOBENATE DIMEGLUMINE 529 MG/ML IV SOLN
15.0000 mL | Freq: Once | INTRAVENOUS | Status: AC | PRN
  Administered 2022-02-04: 15 mL via INTRAVENOUS

## 2022-02-12 NOTE — Progress Notes (Signed)
Carelink Summary Report / Loop Recorder 

## 2022-03-10 ENCOUNTER — Ambulatory Visit (INDEPENDENT_AMBULATORY_CARE_PROVIDER_SITE_OTHER): Payer: Medicare Other

## 2022-03-10 DIAGNOSIS — I480 Paroxysmal atrial fibrillation: Secondary | ICD-10-CM | POA: Diagnosis not present

## 2022-03-11 LAB — CUP PACEART REMOTE DEVICE CHECK
Date Time Interrogation Session: 20231112233112
Implantable Pulse Generator Implant Date: 20200403

## 2022-04-14 ENCOUNTER — Ambulatory Visit (INDEPENDENT_AMBULATORY_CARE_PROVIDER_SITE_OTHER): Payer: Medicare Other

## 2022-04-14 DIAGNOSIS — I480 Paroxysmal atrial fibrillation: Secondary | ICD-10-CM | POA: Diagnosis not present

## 2022-04-15 LAB — CUP PACEART REMOTE DEVICE CHECK
Date Time Interrogation Session: 20231217231329
Implantable Pulse Generator Implant Date: 20200403

## 2022-04-23 NOTE — Progress Notes (Signed)
Carelink Summary Report / Loop Recorder 

## 2022-05-05 ENCOUNTER — Telehealth: Payer: Self-pay

## 2022-05-05 NOTE — Telephone Encounter (Signed)
LINQ alert received. Device reached RRT 1/6 Route to triage Hx of PAF, Eliquis LA

## 2022-05-07 NOTE — Telephone Encounter (Signed)
ILR @ RRT   ILR reached RRT:  05/03/22 Patient called, discussed options to leave device in or explanted.  Patient would like to have device explanted.  Advised would send message to scheduling to schedule follow up with Dr. Curt Bears for loop removal.  Marked "I" in [place]: done Enter note in [facility]: done Canceled future remotes: done Discontinued from website: done Entered in Speciality Comments: done  Advised if further questions arise to please call the device clinic at [phone].

## 2022-05-21 NOTE — Progress Notes (Signed)
Carelink Summary Report / Loop Recorder

## 2022-06-19 ENCOUNTER — Ambulatory Visit: Payer: Medicare Other | Attending: Cardiology | Admitting: Cardiology

## 2022-06-19 ENCOUNTER — Encounter: Payer: Self-pay | Admitting: Cardiology

## 2022-06-19 VITALS — BP 150/74 | HR 62 | Ht 61.5 in | Wt 173.0 lb

## 2022-06-19 DIAGNOSIS — I639 Cerebral infarction, unspecified: Secondary | ICD-10-CM | POA: Insufficient documentation

## 2022-06-19 DIAGNOSIS — D6869 Other thrombophilia: Secondary | ICD-10-CM | POA: Diagnosis present

## 2022-06-19 DIAGNOSIS — I480 Paroxysmal atrial fibrillation: Secondary | ICD-10-CM | POA: Insufficient documentation

## 2022-06-19 NOTE — Progress Notes (Signed)
Electrophysiology Office Note   Date:  06/19/2022   ID:  Jesselynn, Hanas 1945-08-31, MRN [id number]  PCP:  Lin Landsman, MD  Cardiologist:  Irish Lack Primary Electrophysiologist:  Rashawn Rolon Meredith Leeds, MD    Chief Complaint: AF, CVA   History of Present Illness: KELIANA SWEELEY is a 77 y.o. female who is being seen today for the evaluation of AF, CVA at the request of Lin Landsman, MD. Presenting today for electrophysiology evaluation.  She has a history significant for CKD stage III, hypertension, atrial fibrillation, CVA.  She is status post ILR implant.  ILR battery has reached ERI.  She would prefer ILR explant.  Today, she denies symptoms of palpitations, chest pain, shortness of breath, orthopnea, PND, lower extremity edema, claudication, dizziness, presyncope, syncope, bleeding, or neurologic sequela. The patient is tolerating medications without difficulties.    Past Medical History:  Diagnosis Date   CKD (chronic kidney disease), stage III (HCC)    Heterozygous factor V Leiden mutation (HCC)    Hyperlipidemia    Hypertension    Motion sickness    PAF (paroxysmal atrial fibrillation) (HCC)    Prothrombin gene mutation ([place])    Pulmonary hypertension ([place])    a. 2D echo 11/01/17 showed EF 65-70%, grade 1 DD, mod-severe TR, moderately increased PASP 98mHg. b. F/u Echo 07/2018 not seen.   PVC's (premature ventricular contractions)    occasional PVCs   Sinus bradycardia    a. requiring reduction in beta blocker in 2020.   Stroke (cerebrum) (HCC)    SVT (supraventricular tachycardia)    Tricuspid regurgitation    Past Surgical History:  Procedure Laterality Date   HYSTEROSCOPY WITH D & C  11/07/2011   Procedure: DILATATION AND CURETTAGE /HYSTEROSCOPY;  Surgeon: LLahoma Crocker MD;  [facility];  Service: Gynecology;  Laterality: N/A;  endometrial polypectomy   LOOP RECORDER INSERTION N/A 07/30/2018   Procedure: LOOP RECORDER INSERTION;  Surgeon: CConstance Haw MD;  [facility];  Service: Cardiovascular;  Laterality: N/A;   TUBAL LIGATION  1977     Current Outpatient Medications  Medication Sig Dispense Refill   atenolol (TENORMIN) 50 MG tablet Take 1 tablet (50 mg total) by mouth daily. 90 tablet 3   Calcium Carbonate-Vitamin D (CALCIUM-VITAMIN D) 600-3.125 MG-MCG TABS Take 1 tablet by mouth daily.     carboxymethylcellulose (REFRESH PLUS) 0.5 % SOLN Place 1 drop into both eyes daily as needed (for dry eyes).     Chlorphen-PE-Acetaminophen (CORICIDIN D COLD/FLU/SINUS PO) Take 2 tablets by mouth as needed (Allergies).     ELIQUIS 5 MG TABS tablet TAKE 1 TABLET(5 MG) BY MOUTH TWICE DAILY 60 tablet 5   famotidine (PEPCID) 20 MG tablet Take 20 mg by mouth daily.     fexofenadine-pseudoephedrine (ALLEGRA-D 24) 180-240 MG 24 hr tablet Take 1 tablet by mouth daily as needed (allergies).     meclizine (ANTIVERT) 12.5 MG tablet Take 25 mg by mouth 3 (three) times daily as needed for dizziness.     rosuvastatin (CRESTOR) 40 MG tablet Take 1 tablet (40 mg total) by mouth daily at 6 PM. 30 tablet 3   triamterene-hydrochlorothiazide (DYAZIDE) 37.5-25 MG capsule Take 1 capsule by mouth daily.     methylPREDNISolone (MEDROL DOSEPAK) 4 MG TBPK tablet Take by mouth as directed. (Patient not taking: Reported on 06/19/2022)     ondansetron (ZOFRAN-ODT) 4 MG disintegrating tablet Take 1 tablet (4 mg total) by mouth every 8 (eight) hours as  needed for nausea or vomiting. (Patient not taking: Reported on 06/19/2022) 12 tablet 0   No current facility-administered medications for this visit.    Allergies:   Cefdinir, Eggs or egg-derived products, Orange fruit [citrus], Prednisone, Sulfa antibiotics, Tomato, Influenza vaccines, Aspartame and phenylalanine, Caffeine, and Shellfish allergy   Social History:  The patient  reports that she has never smoked. She has never used smokeless tobacco. She reports that she does not drink alcohol and does  not use drugs.   Family History:  The patient's family history includes Diabetes in her mother; Heart attack in her father and mother.    ROS:  Please see the history of present illness.   Otherwise, review of systems is positive for none.   All other systems are reviewed and negative.    PHYSICAL EXAM: VS:  BP (!) 150/74   Pulse 62   Ht 5' 1.5" (1.562 m)   Wt 173 lb (78.5 kg)   SpO2 97%   BMI 32.16 kg/m  , BMI Body mass index is 32.16 kg/m. GEN: Well nourished, well developed, in no acute distress  HEENT: normal  Neck: no JVD, carotid bruits, or masses Cardiac: RRR; no murmurs, rubs, or gallops,no edema  Respiratory:  clear to auscultation bilaterally, normal work of breathing GI: soft, nontender, nondistended, + BS MS: no deformity or atrophy  Skin: warm and dry, device pocket is well healed Neuro:  Strength and sensation are intact Psych: euthymic mood, full affect  EKG:  EKG is ordered today. Personal review of the ekg ordered 01/24/22 shows sinus rhythm  Recent Labs: 12/12/2021: ALT 14; BUN 41; Creatinine, Ser 2.30; Hemoglobin 12.4; Platelets 229; Potassium 3.6; Sodium 141    Lipid Panel     Component Value Date/Time   CHOL 157 04/09/2020 0915   TRIG 103 04/09/2020 0915   HDL 70 04/09/2020 0915   CHOLHDL 2.2 04/09/2020 0915   CHOLHDL 4.0 07/29/2018 0553   VLDL 19 07/29/2018 0553   LDLCALC 69 04/09/2020 0915     Wt Readings from Last 3 Encounters:  06/19/22 173 lb (78.5 kg)  01/24/22 172 lb 12.8 oz (78.4 kg)  07/08/21 180 lb 12.8 oz (82 kg)      Other studies Reviewed: Additional studies/ records that were reviewed today include: TTE 2020  Review of the above records today demonstrates:   1. The left ventricle has normal systolic function with an ejection  fraction of 60-65%. The cavity size was normal. There is moderately  increased left ventricular wall thickness. Left ventricular diastolic  Doppler parameters are consistent with impaired    relaxation. Indeterminate filling pressures The E/e' is 8-15. No evidence  of left ventricular regional wall motion abnormalities.   2. The right ventricle has normal systolic function. The cavity was  normal. There is no increase in right ventricular wall thickness.   3. The mitral valve is degenerative. Mild thickening of the mitral valve  leaflet. There is mild mitral annular calcification present.   4. The tricuspid valve is grossly normal.   5. The aortic valve is tricuspid. Mild calcification of the aortic valve.  Aortic valve regurgitation is trivial by color flow Doppler.    ASSESSMENT AND PLAN:  1.  Cryptogenic stroke: Status post ILR implant.  Battery at [facility].  Ariele Vidrio plan for ILR explant.  Risk and benefits have been discussed and include bleeding and infection.  She understands the risks and is agreed to the procedure.  2.  Paroxysmal atrial fibrillation: CHA2DS2-VASc of  5.  Currently on Eliquis.  Remains in sinus rhythm.  Plan per primary cardiology.  3.  Secondary hypercoagulable state: Currently on Eliquis for atrial fibrillation  Current medicines are reviewed at length with the patient today.   The patient does not have concerns regarding her medicines.  The following changes were made today:  none  Labs/ tests ordered today include:  No orders of the defined types were placed in this encounter.    Disposition:   FU with Joelie Schou PRN  Signed, Jacobo Moncrief Meredith Leeds, MD  06/19/2022 1:37 PM     [address] [place] [address] [phone] (office) [phone] (fax)  SURGEON:  Allegra Lai, MD     PREPROCEDURE DIAGNOSIS:  CVA    POSTPROCEDURE DIAGNOSIS:  CVA     PROCEDURES:   1. Implantable loop recorder implantation    INTRODUCTION:  CALLIA DIBLER is a 77 y.o. female with a history of unexplained stroke who presents today for implantable loop explant.  she has had a LINQ monitor.  LINQ is now at [facility] and wishes  the monitor explanted.  Appropriate timeout was performed.    DESCRIPTION OF PROCEDURE:  Informed written consent was obtained, and the patient was brought to the electrophysiology lab in a fasting state.  The patient required no sedation for the procedure today.  The patients left chest was therefore prepped and draped in the usual sterile fashion by the EP lab staff. The skin overlying the left parasternal region was infiltrated with lidocaine for local analgesia.  A 0.5-cm incision was made over the left parasternal region over the existing LINQ monitor.  Using a combination of sharp and blount dissection, the LINQ monitor was removed from the pocket.  Steri- Strips and a sterile dressing were then applied.  There were no early apparent complications.     CONCLUSIONS:   1. Successful explant of a [employer] Reveal LINQ implantable loop recorder for CVA  2. No early apparent complications.

## 2022-06-19 NOTE — Patient Instructions (Addendum)
Medication Instructions:  Your physician recommends that you continue on your current medications as directed. Please refer to the Current Medication list given to you today.  Labwork: None ordered.  Testing/Procedures: None ordered.  Follow-Up:  Your physician wants you to follow-up as needed with Dr. Curt Bears.  You will receive a reminder letter in the mail two months in advance. If you don't receive a letter, please call our office to schedule the follow-up appointment.    Implantable Loop Recorder Removal, Care After This sheet gives you information about how to care for yourself after your procedure. Your health care provider may also give you more specific instructions. If you have problems or questions, contact your health care provider. What can I expect after the procedure? After the procedure, it is common to have: Soreness or discomfort near the incision. Some swelling or bruising near the incision.  Follow these instructions at home: Incision care  Monitor your cardiac device site for redness, swelling, and drainage. Call the device clinic at [phone] if you experience these symptoms or fever/chills.  Keep the large square bandage on your site for 24 hours and then you may remove it yourself. Keep the steri-strips underneath in place.   You may shower after 72 hours / 3 days from your procedure with the steri-strips in place. They will usually fall off on their own, or may be removed after 10 days. Pat dry.   Avoid lotions, ointments, or perfumes over your incision until it is well-healed.  Please do not submerge in water until your site is completely healed.   If your wound site starts to bleed apply pressure.       If you have any questions/concerns please call the device clinic at [phone].  Activity  Return to your normal activities.  Contact a health care provider if: You have redness, swelling, or pain around your incision. You have a fever.

## 2022-07-03 ENCOUNTER — Other Ambulatory Visit: Payer: Self-pay | Admitting: Physician Assistant

## 2022-09-01 ENCOUNTER — Other Ambulatory Visit: Payer: Self-pay | Admitting: Cardiology

## 2022-09-01 DIAGNOSIS — I480 Paroxysmal atrial fibrillation: Secondary | ICD-10-CM

## 2022-09-01 NOTE — Telephone Encounter (Signed)
Prescription refill request for Eliquis received. Indication: Afib  Last office visit: 06/19/22 ([place])  Scr: 1.97 (06/12/22)  Age: 77 Weight: 78.5kg  Appropriate dose. Refill sent.

## 2023-01-28 ENCOUNTER — Telehealth: Payer: Self-pay | Admitting: Interventional Cardiology

## 2023-01-28 ENCOUNTER — Telehealth: Payer: Self-pay

## 2023-01-28 NOTE — Telephone Encounter (Signed)
Pre-operative Risk Assessment    Patient Name: Jasmine Dawson  DOB: [birth year] MRN: [id number]  Last visit 06/19/22 Next visit 03/05/23      Request for Surgical Clearance    Procedure:   colonoscopy  Date of Surgery:  Clearance 02/19/23                                 Surgeon:  Dr. Kathi Der Surgeon's Group or Practice Name:  [facility]  Phone number:  [phone] Fax number:  [phone]   Type of Clearance Requested:   - Medical  - Pharmacy:  Hold Apixaban (Eliquis)     Type of Anesthesia:   Propofol   Additional requests/questions:   Hold Eliquis for 2 days  Signed, Royann Shivers   01/28/2023, 11:54 AM

## 2023-01-28 NOTE — Telephone Encounter (Signed)
Pt is scheduled for tele on 10/18/ at 10am. Med rec and consent done.

## 2023-01-28 NOTE — Addendum Note (Signed)
Addended by: Anselm Lis A on: 01/28/2023 01:57 PM   Modules accepted: Orders

## 2023-01-28 NOTE — Telephone Encounter (Signed)
Name: Jasmine Dawson  DOB: 1945/12/04  MRN: [id number]  Primary Cardiologist: Lance Muss, MD   Preoperative team, please contact this patient and set up a phone call appointment for further preoperative risk assessment. Please obtain consent and complete medication review. Thank you for your help.  I confirm that guidance regarding antiplatelet and oral anticoagulation therapy has been completed and, if necessary, noted below.  Per office protocol, patient can hold Eliquis for 2 days prior to procedure as requested. Recommend resuming as soon as safely possible after given elevated CV risk.    I also confirmed the patient resides in the state of [place]. As per [facility] Medical Board telemedicine laws, the patient must reside in the state in which the provider is licensed.   Napoleon Form, Leodis Rains, NP 01/28/2023, 12:44 PM [facility]

## 2023-01-28 NOTE — Telephone Encounter (Signed)
Pt is scheduled for tele on 10/18/ at 10am. Med rec and consent done.     Patient Consent for Virtual Visit        Jasmine Dawson has provided verbal consent on 01/28/2023 for a virtual visit (video or telephone).   CONSENT FOR VIRTUAL VISIT FOR:  Jasmine Dawson  By participating in this virtual visit I agree to the following:  I hereby voluntarily request, consent and authorize [facility] and its employed or contracted physicians, physician assistants, nurse practitioners or other licensed health care professionals (the Practitioner), to provide me with telemedicine health care services (the "Services") as deemed necessary by the treating Practitioner. I acknowledge and consent to receive the Services by the Practitioner via telemedicine. I understand that the telemedicine visit will involve communicating with the Practitioner through live audiovisual communication technology and the disclosure of certain medical information by electronic transmission. I acknowledge that I have been given the opportunity to request an in-person assessment or other available alternative prior to the telemedicine visit and am voluntarily participating in the telemedicine visit.  I understand that I have the right to withhold or withdraw my consent to the use of telemedicine in the course of my care at any time, without affecting my right to future care or treatment, and that the Practitioner or I may terminate the telemedicine visit at any time. I understand that I have the right to inspect all information obtained and/or recorded in the course of the telemedicine visit and may receive copies of available information for a reasonable fee.  I understand that some of the potential risks of receiving the Services via telemedicine include:  Delay or interruption in medical evaluation due to technological equipment failure or disruption; Information transmitted may not be sufficient (e.g. poor resolution  of images) to allow for appropriate medical decision making by the Practitioner; and/or  In rare instances, security protocols could fail, causing a breach of personal health information.  Furthermore, I acknowledge that it is my responsibility to provide information about my medical history, conditions and care that is complete and accurate to the best of my ability. I acknowledge that Practitioner's advice, recommendations, and/or decision may be based on factors not within their control, such as incomplete or inaccurate data provided by me or distortions of diagnostic images or specimens that may result from electronic transmissions. I understand that the practice of medicine is not an exact science and that Practitioner makes no warranties or guarantees regarding treatment outcomes. I acknowledge that a copy of this consent can be made available to me via my patient portal [facility] MyChart), or I can request a printed copy by calling the office of [facility].    I understand that my insurance will be billed for this visit.   I have read or had this consent read to me. I understand the contents of this consent, which adequately explains the benefits and risks of the Services being provided via telemedicine.  I have been provided ample opportunity to ask questions regarding this consent and the Services and have had my questions answered to my satisfaction. I give my informed consent for the services to be provided through the use of telemedicine in my medical care

## 2023-01-28 NOTE — Telephone Encounter (Signed)
Patient with diagnosis of afib and heterozygous factor V leiden mutation on Eliquis for anticoagulation.    Procedure: colonoscopy Date of procedure: 02/19/23  CHA2DS2-VASc Score = 6  This indicates a 9.7% annual risk of stroke. The patient's score is based upon: CHF History: 0 HTN History: 1 Diabetes History: 0 Stroke History: 2 Vascular Disease History: 0 Age Score: 2 Gender Score: 1  Stroke 2020  CrCl 14mL/min using adjusted body weight Platelet count 250K  Per office protocol, patient can hold Eliquis for 2 days prior to procedure as requested. Recommend resuming as soon as safely possible after given elevated CV risk.    **This guidance is not considered finalized until pre-operative APP has relayed final recommendations.**

## 2023-01-28 NOTE — Telephone Encounter (Signed)
Pharmacy please advise on holding Eliquis prior to colonoscopy scheduled for 02/19/2023. Thank you.

## 2023-02-13 ENCOUNTER — Ambulatory Visit: Payer: Medicare Other | Attending: Physician Assistant

## 2023-02-13 DIAGNOSIS — Z01810 Encounter for preprocedural cardiovascular examination: Secondary | ICD-10-CM

## 2023-02-13 NOTE — Progress Notes (Signed)
Virtual Visit via Telephone Note   Because of Jasmine Dawson's co-morbid illnesses, she is at least at moderate risk for complications without adequate follow up.  This format is felt to be most appropriate for this patient at this time.  The patient did not have access to video technology/had technical difficulties with video requiring transitioning to audio format only (telephone).  All issues noted in this document were discussed and addressed.  No physical exam could be performed with this format.  Please refer to the patient's chart for her consent to telehealth for [facility].  Evaluation Performed:  Preoperative cardiovascular risk assessment _____________   Date:  02/13/2023   Patient ID:  Jasmine Dawson, DOB [birth year], MRN [id number] Patient Location:  Home Provider location:   Office  Primary Care Provider:  Leilani Able, MD Primary Cardiologist:  Lance Muss, MD  Chief Complaint / Patient Profile   77 y.o. y/o female with a h/o CKD, CVA, AF, CKD stage III, hypertension, and ILR implant who is pending colonoscopy and presents today for telephonic preoperative cardiovascular risk assessment.  History of Present Illness    Jasmine Dawson is a 77 y.o. female who presents via audio/video conferencing for a telehealth visit today.  Pt was last seen in cardiology clinic on 06/19/22 by Dr. Lalla Brothers.  At that time [name] was doing well .  The patient is now pending procedure as outlined above. Since her last visit, she tells me that she has not had any issues with her heart.  However, she has been dealing with some sinus drainage and congestion since the weather is changed.  Unfortunately, this really affects her and she canceled her colonoscopy with plans to reschedule in December/January timeframe.  She has a lot of allergies and does not feel very good at the moment.  From a heart standpoint, used to beat fast once or twice a year but now is better  controlled.  She lives in a two-story house that she has to go up and down stairs often.  She has never had any shortness of breath.  For this reason she has surpassed 4 METS on the DASI.   Per office protocol, patient can hold Eliquis for 2 days prior to procedure as requested. Recommend resuming as soon as safely possible after given elevated CV risk.   Past Medical History    Past Medical History:  Diagnosis Date   CKD (chronic kidney disease), stage III (HCC)    Heterozygous factor V Leiden mutation (HCC)    Hyperlipidemia    Hypertension    Motion sickness    PAF (paroxysmal atrial fibrillation) (HCC)    Prothrombin gene mutation (HCC)    Pulmonary hypertension (HCC)    a. 2D echo 11/01/17 showed EF 65-70%, grade 1 DD, mod-severe TR, moderately increased PASP . b. F/u Echo 07/2018 not seen.   PVC's (premature ventricular contractions)    occasional PVCs   Sinus bradycardia    a. requiring reduction in beta blocker in 2020.   Stroke (cerebrum) (HCC)    SVT (supraventricular tachycardia)    Tricuspid regurgitation    Past Surgical History:  Procedure Laterality Date   HYSTEROSCOPY WITH D & C  11/07/2011   Procedure: DILATATION AND CURETTAGE /HYSTEROSCOPY;  Surgeon: Antionette Char, MD;  Location: [facility];  Service: Gynecology;  Laterality: N/A;  endometrial polypectomy   LOOP RECORDER INSERTION N/A 07/30/2018   Procedure: LOOP RECORDER INSERTION;  Surgeon: Regan Lemming, MD;  Location: [facility] INVASIVE CV [facility];  Service: Cardiovascular;  Laterality: N/A;   TUBAL LIGATION  1977    Allergies  Allergies  Allergen Reactions   Cefdinir     Other reaction(s): vomiting   Egg-Derived Products Anaphylaxis and Swelling   Orange Fruit [Citrus] Anaphylaxis and Swelling   Prednisone Anaphylaxis, Hives and Itching   Sulfa Antibiotics Anaphylaxis, Hives and Itching   Tomato Anaphylaxis and Swelling   Influenza Vaccines Other (See Comments)    unknown   Aspartame And  Phenylalanine Palpitations   Caffeine Palpitations   Shellfish Allergy Itching, Nausea And Vomiting and Rash    Home Medications    Prior to Admission medications   Medication Sig Start Date End Date Taking? Authorizing Provider  atenolol (TENORMIN) 50 MG tablet TAKE 1 TABLET(50 MG) BY MOUTH DAILY 07/03/22   Camnitz, Andree Coss, MD  Calcium Carbonate-Vitamin D (CALCIUM-VITAMIN D) 600-3.125 MG-MCG TABS Take 1 tablet by mouth daily.    [provider]  carboxymethylcellulose (REFRESH PLUS) 0.5 % SOLN Place 1 drop into both eyes daily as needed (for dry eyes).    [provider]  Chlorphen-PE-Acetaminophen (CORICIDIN D COLD/FLU/SINUS PO) Take 2 tablets by mouth as needed (Allergies).    [provider]  ELIQUIS 5 MG TABS tablet TAKE 1 TABLET(5 MG) BY MOUTH TWICE DAILY 09/01/22   Camnitz, Andree Coss, MD  famotidine (PEPCID) 20 MG tablet Take 20 mg by mouth daily.    [provider]  fexofenadine-pseudoephedrine (ALLEGRA-D 24) 180-240 MG 24 hr tablet Take 1 tablet by mouth daily as needed (allergies).    [provider]  meclizine (ANTIVERT) 12.5 MG tablet Take 25 mg by mouth 3 (three) times daily as needed for dizziness. 12/06/21   [provider]  rosuvastatin (CRESTOR) 40 MG tablet Take 1 tablet (40 mg total) by mouth daily at 6 PM. 07/31/18   Yvette Rack, MD    Physical Exam    Vital Signs:  Jasmine Dawson does not have vital signs available for review today.  Given telephonic nature of communication, physical exam is limited. AAOx3. NAD. Normal affect.  Speech and respirations are unlabored.  Accessory Clinical Findings    None  Assessment & Plan    1.  Preoperative Cardiovascular Risk Assessment:  [name]'s perioperative risk of a major cardiac event is 0.9% according to the Revised Cardiac Risk Index (RCRI).  Therefore, she is at low risk for perioperative complications.   Her functional capacity is good at 5.81 METs  according to the Duke Activity Status Index (DASI). Recommendations: According to ACC/AHA guidelines, no further cardiovascular testing needed.  The patient may proceed to surgery at acceptable risk.   Antiplatelet and/or Anticoagulation Recommendations:  Eliquis (Apixaban) can be held for 2 days prior to surgery.  Please resume post op when felt to be safe.     The patient was advised that if she develops new symptoms prior to surgery to contact our office to arrange for a follow-up visit, and she verbalized understanding.    A copy of this note will be routed to requesting surgeon.  Time:   Today, I have spent 18 minutes with the patient with telehealth technology discussing medical history, symptoms, and management plan.     Sharlene Dory, PA-C  02/13/2023, 8:06 AM

## 2023-02-23 ENCOUNTER — Other Ambulatory Visit: Payer: Self-pay | Admitting: Cardiology

## 2023-02-23 DIAGNOSIS — I480 Paroxysmal atrial fibrillation: Secondary | ICD-10-CM

## 2023-02-23 NOTE — Telephone Encounter (Signed)
Eliquis 5mg  refill request received. Patient is 77 years old, weight-78.5kg, Crea-1.97 on 06/13/22 via scanned labs from [employer], [place], and last seen by [name] on 06/19/22. Dose is appropriate based on dosing criteria. Will send in refill to requested pharmacy.

## 2023-02-27 LAB — LAB REPORT - SCANNED: EGFR: 23

## 2023-03-02 ENCOUNTER — Encounter: Payer: Self-pay | Admitting: Physician Assistant

## 2023-03-02 NOTE — Progress Notes (Addendum)
Cardiology Office Note    Date:  03/05/2023  ID:  Jasmine, Dawson Sep 20, 1945, MRN [id number] PCP:  Leilani Able, MD  Cardiologist:  Lance Muss, MD  Electrophysiologist:  Regan Lemming, MD   Chief Complaint: f/u PAF, PSVT  History of Present Illness: .    Jasmine Dawson is a 77 y.o. female with visit-pertinent history of paroxysmal SVT, PVCs, PAF, first degree AVB, HTN, HLD, CKD IV followed by [name], prior tricuspid regurgitation and pulmonary HTN (not seen on f/u echo), prothrombin gene mutation, heterozygous factor V Leiden (family hx of clotting), stroke who presents for routine follow-up.   Ms. Jasmine Dawson has a longstanding history of paroxysmal SVT. 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 Bystolic was changed to metoprolol. 2D echo at that time showed EF 65-70%, G1DD, mod-severe TR, moderately increased PASP . D-dimer was significantly elevated. CT angio 10/31/17 had shown no PE, + mild cardiomegaly with trace pleural effusions. She had breakthrough SVT on metoprolol so this was changed to atenolol. Regarding her prior tricuspid regurgitation, pulmonary HTN, and family history of clotting, I had reviewed with DOD and we proceeded with repeat echo and hypercoag workup. Repeat echo interestingly had shown only mild TR and lower PA pressure so CTEPH was felt less likely. 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 I referred her to hematology. At that time she was not felt to require anticoagulation unless she developed a blood clot. Aggressive DVT prophylaxis was recommended for surgical procedures. She was admitted early 07/2018 with left face tingling and right hand weakness and found to have left frontal parietal/MCA infarct felt embolic. 2D echo then showed EF 60-65%, impaired diastolic relaxation, trivial TR. Interestingly  the pulmonary HTN was no longer an issue. LE venous duplex was negative. TEE was deferred due to Covid-19. TCD was unremarkable. She underwent loop recorder insertion by the EP team which ultimately demonstrated episodes of atrial fibrillation followed by [name]. Plavix was stopped and she was changed to Eliquis. Her son passed away unexpectedly in Apr 14, 2020 after an asthma attack.   She is seen back for follow-up largely doing well without angina, dyspnea or palpitations. As in prior OVs she reports h/o white coat HTN with BPs hovering around 110/70 at home. She reports report some episodic leg cramping that she thinks might be from the Crestor. She had a mechanical fall about a week ago where she had been going up and down the stairs about 10-12x that day. No overt injury other than to knee which appears atraumatic. No syncope. She had a preop phone visit in 01/2023 for colonoscopy which she reports had to be rescheduled due to company arriving, date still TBD.   Labwork independently reviewed: [facility] 09/2022 Cr 2.240, Hgb 11.6 05/2022 scan K 4.3, Cr 1.97, LFts ok, Hgb 11.6, plt 250 04-14-2020 LDL 69, trig 103  ROS: .    Please see the history of present illness. All other systems are reviewed and otherwise negative.  Studies Reviewed: [name]    EKG:  EKG is ordered today, personally reviewed, demonstrating SB 56bpm first degree AVB no acute change from prior  CV Studies: Cardiac studies reviewed are outlined and summarized above. Otherwise please see EMR for full report.   Current Reported Medications:.    Current Meds  Medication Sig   atenolol (TENORMIN) 50 MG tablet TAKE 1 TABLET(50 MG)  BY MOUTH DAILY   Calcium Carbonate-Vitamin D (CALCIUM-VITAMIN D) 600-3.125 MG-MCG TABS Take 1 tablet by mouth daily.   carboxymethylcellulose (REFRESH PLUS) 0.5 % SOLN Place 1 drop into both eyes daily as needed (for dry eyes).   Chlorphen-PE-Acetaminophen (CORICIDIN D COLD/FLU/SINUS PO) Take 2 tablets by mouth as  needed (Allergies).   ELIQUIS 5 MG TABS tablet TAKE 1 TABLET(5 MG) BY MOUTH TWICE DAILY   famotidine (PEPCID) 20 MG tablet Take 20 mg by mouth daily.   fexofenadine-pseudoephedrine (ALLEGRA-D 24) 180-240 MG 24 hr tablet Take 1 tablet by mouth daily as needed (allergies).   meclizine (ANTIVERT) 12.5 MG tablet Take 25 mg by mouth 3 (three) times daily as needed for dizziness.   rosuvastatin (CRESTOR) 40 MG tablet Take 1 tablet (40 mg total) by mouth daily at 6 PM.   triamterene-hydrochlorothiazide (DYAZIDE) 37.5-25 MG capsule Take 1 capsule by mouth daily.    Physical Exam:    VS:  BP (!) 144/74   Pulse 61   Ht 5' 1.5" (1.562 m)   Wt 166 lb 6.4 oz (75.5 kg)   SpO2 99%   BMI 30.93 kg/m    Wt Readings from Last 3 Encounters:  03/05/23 166 lb 6.4 oz (75.5 kg)  06/19/22 173 lb (78.5 kg)  01/24/22 172 lb 12.8 oz (78.4 kg)    GEN: Well nourished, well developed in no acute distress NECK: No JVD; No carotid bruits CARDIAC: RRR, no murmurs, rubs, gallops RESPIRATORY:  Clear to auscultation without rales, wheezing or rhonchi  ABDOMEN: Soft, non-tender, non-distended EXTREMITIES:  No edema; No acute deformity   Asessement and Plan:.    1. Paroxysmal atrial fibrillation, also h/o PSVT - quiescent on atenolol. Atenolol is less ideal in setting of CKD but since she previously had breakthrough arrhythmia on other beta blockers, reasonable to continue since she is doing well. 50mg  is the maximum dose for her present creatinine clearance. We will continue apixaban at 5mg  BID but will need to follow dose closely as she will turn 78 next year. Update labs today. I still think she is OK for colonoscopy as requested by GI, no change to plan as previously outlined in telephone pre-op visit. Can hold Eliquis 2 days prior to procedure. I will route updated note to GI so they're aware. We also discussed avoidance of decongestant stimulant type medicines like phenylephrine or pseudoephedrine.  2. HTN with CKD  stage IV - BP above goal but not rechecked as this is consistent with prior known whitecoat HTN/anxiety in the office regarding this issue; she follows BP closely at home and reports values 110s/70s. Therefore no changes made today. She will notify for any new concerns.  3. Hyperlipidemia - reports occasional muscle cramps which she feels is related to Crestor. Will update lytes and CK. Given her CrCl her max dose really should be 10mg  daily. We will reduce to this value. She has also worked on cutting out red meat. We will recheck her fasting labs in 2 months to reassess LDL. She had prior intolerance of atorvastatin, so if LDL not at goal at that time, would refer to lipid clinic for additional options - goal LDL <70 given h/o stroke. She will notify if muscle cramping continues despite dose reduction.  4. H/o pulmonary HTN - no longer an issue as of 2020 echo. In absence of symptoms, follow clinically.     Disposition: F/u with [name] ~5-6 months. Recommend to schedule around the time of her 78th birthday as she  will need to re-evaluate apixaban dosing at that time. Dr. Eldridge Dace has relocated so I think it should be fine for [name] to be her general cardiologist going forward as majority of history is arrhythmic in nature.  Signed, Laurann Montana, PA-C

## 2023-03-05 ENCOUNTER — Other Ambulatory Visit: Payer: Self-pay | Admitting: Physician Assistant

## 2023-03-05 ENCOUNTER — Other Ambulatory Visit: Payer: Self-pay

## 2023-03-05 ENCOUNTER — Encounter: Payer: Self-pay | Admitting: Physician Assistant

## 2023-03-05 ENCOUNTER — Ambulatory Visit: Payer: Medicare Other | Attending: Physician Assistant | Admitting: Physician Assistant

## 2023-03-05 VITALS — BP 144/74 | HR 61 | Ht 61.5 in | Wt 166.4 lb

## 2023-03-05 DIAGNOSIS — I480 Paroxysmal atrial fibrillation: Secondary | ICD-10-CM | POA: Insufficient documentation

## 2023-03-05 DIAGNOSIS — I10 Essential (primary) hypertension: Secondary | ICD-10-CM

## 2023-03-05 DIAGNOSIS — I471 Supraventricular tachycardia, unspecified: Secondary | ICD-10-CM

## 2023-03-05 DIAGNOSIS — I272 Pulmonary hypertension, unspecified: Secondary | ICD-10-CM

## 2023-03-05 DIAGNOSIS — R252 Cramp and spasm: Secondary | ICD-10-CM

## 2023-03-05 DIAGNOSIS — E785 Hyperlipidemia, unspecified: Secondary | ICD-10-CM

## 2023-03-05 DIAGNOSIS — N184 Chronic kidney disease, stage 4 (severe): Secondary | ICD-10-CM | POA: Insufficient documentation

## 2023-03-05 LAB — COMPREHENSIVE METABOLIC PANEL
ALT: 10 [IU]/L (ref 0–32)
AST: 33 [IU]/L (ref 0–40)
Albumin: 4.5 g/dL (ref 3.8–4.8)
Alkaline Phosphatase: 88 [IU]/L (ref 44–121)
BUN/Creatinine Ratio: 19 (ref 12–28)
BUN: 42 mg/dL — ABNORMAL HIGH (ref 8–27)
Bilirubin Total: 0.3 mg/dL (ref 0.0–1.2)
CO2: 24 mmol/L (ref 20–29)
Calcium: 9.9 mg/dL (ref 8.7–10.3)
Chloride: 100 mmol/L (ref 96–106)
Creatinine, Ser: 2.18 mg/dL — ABNORMAL HIGH (ref 0.57–1.00)
Globulin, Total: 3 g/dL (ref 1.5–4.5)
Glucose: 93 mg/dL (ref 70–99)
Potassium: 4.4 mmol/L (ref 3.5–5.2)
Sodium: 139 mmol/L (ref 134–144)
Total Protein: 7.5 g/dL (ref 6.0–8.5)
eGFR: 22 mL/min/{1.73_m2} — ABNORMAL LOW (ref >59)

## 2023-03-05 LAB — LIPID PANEL
Chol/HDL Ratio: 2.1 ratio (ref 0.0–4.4)
Cholesterol, Total: 180 mg/dL (ref 100–199)
HDL: 85 mg/dL (ref >39)
LDL Chol Calc (NIH): 77 mg/dL (ref 0–99)
Triglycerides: 104 mg/dL (ref 0–149)
VLDL Cholesterol Cal: 18 mg/dL (ref 5–40)

## 2023-03-05 MED ORDER — ROSUVASTATIN CALCIUM 10 MG PO TABS: 10.0000 mg | ORAL_TABLET | Freq: Every day | ORAL | 3 refills | Status: DC

## 2023-03-05 NOTE — Patient Instructions (Signed)
Medication Instructions:  DECREASE Crestor to 10mg  Take 1 tablet once a day  *If you need a refill on your cardiac medications before your next appointment, please call your pharmacy*   Lab Work: TODAY-BMET, MAG, CBC, CK 2 MONTHS FASTING CMET & LIPIDS If you have labs (blood work) drawn today and your tests are completely normal, you will receive your results only by: MyChart Message (if you have MyChart) OR A paper copy in the mail If you have any lab test that is abnormal or we need to change your treatment, we will call you to review the results.   Testing/Procedures: NONE ORDERED   Follow-Up: At [facility], you and your health needs are our priority.  As part of our continuing mission to provide you with exceptional heart care, we have created designated Provider Care Teams.  These Care Teams include your primary Cardiologist (physician) and Advanced Practice Providers (APPs -  Physician Assistants and Nurse Practitioners) who all work together to provide you with the care you need, when you need it.  We recommend signing up for the patient portal called "MyChart".  Sign up information is provided on this After Visit Summary.  MyChart is used to connect with patients for Virtual Visits (Telemedicine).  Patients are able to view lab/test results, encounter notes, upcoming appointments, etc.  Non-urgent messages can be sent to your provider as well.   To learn more about what you can do with MyChart, go to [email or web address].    Your next appointment:   6 month(s)  Provider:   Loman Brooklyn, MD    Other Instructions

## 2023-03-06 ENCOUNTER — Telehealth: Payer: Self-pay | Admitting: *Deleted

## 2023-03-06 DIAGNOSIS — E782 Mixed hyperlipidemia: Secondary | ICD-10-CM

## 2023-03-06 DIAGNOSIS — R748 Abnormal levels of other serum enzymes: Secondary | ICD-10-CM

## 2023-03-06 NOTE — Telephone Encounter (Signed)
-----   Message from Laurann Montana sent at 03/06/2023  8:08 AM EST ----- It looks like the lab drew the wrong labs yesterday.  Plan was for BMET, MAG, CBC, CK level after visit yesterday, and then in 2 months, obtain fasting lipid panel + CMET.  Please let her know that the labs they did run are overall stable - abnormal kidney function is similar to prior.   - Please notify clinic supervisor of wrong labs obtained so this can be corrected with our lab team going forward. Should not be charged for lipid panel obtained yesterday. She was not fasting and we were adjusting medication anyway. - May need to return to have CBC, CK level, magnesium drawn if they are unable to add on as intended yesterday - Need to ensure labs are in for the 2 month plan (CMET + fasting lipid panel) - As far as follow-up goes, I would recommend that she schedule her next appointment with [name] shortly before her 78th birthday so that we can review the plan for her Eliquis dosing at that time. When she turns 78, I anticipate we'll need to drop the dose to 2.5mg  BID, but for now, continue 5mg  BID.

## 2023-03-06 NOTE — Telephone Encounter (Signed)
I spoke with [facility] and Mag and CK level can be added.  Patient will need to return to office for CBC. I spoke with patient and reviewed lab results and recommendations from Ronie Spies, [place] with her.  Patient reports lab in our office was unable to obtain sample due to her small veins.  She had lab work done in [facility] on the first floor.  She will return to first floor [facility] on Monday for CBC.   Patient aware she will need fasting lab work in 2 months. Message sent to EP scheduling regarding need for appointment in April.

## 2023-03-09 ENCOUNTER — Other Ambulatory Visit: Payer: Self-pay | Admitting: *Deleted

## 2023-03-09 DIAGNOSIS — R748 Abnormal levels of other serum enzymes: Secondary | ICD-10-CM

## 2023-03-09 DIAGNOSIS — E782 Mixed hyperlipidemia: Secondary | ICD-10-CM

## 2023-03-09 NOTE — Telephone Encounter (Signed)
Patient notified.  Appointment made for patient to see pharmacist on 03/23/23.  She will have CK level done the same day after this appointment.

## 2023-03-09 NOTE — Telephone Encounter (Signed)
-----   Message from Laurann Montana sent at 03/09/2023  7:42 AM EST ----- Please let patient know CK level (marker of muscle breakdown) is elevated. I'd like to stop rosuvastatin altogether for now (do not throw away) and recheck CK level in 2 weeks. Make sure to hydrate well. Since we are having to stop the rosuvastatin and recent LDL was above goal anyway, recommend we refer to pharmD lipid clinic to discuss alternatives going forward given history of stroke. Thank you!

## 2023-03-11 ENCOUNTER — Telehealth: Payer: Self-pay | Admitting: Interventional Cardiology

## 2023-03-11 NOTE — Telephone Encounter (Signed)
Pt called and said she missed a call from our office but no one left a voicemail. I explained I did not see a telephone note or any encounter note but if I found anything I would call her back to let her know.

## 2023-03-11 NOTE — Telephone Encounter (Signed)
Patient states they received a call and would like a call back. No VM was left and she is unsure as to what it was in regards to. Please advise.

## 2023-03-13 LAB — CK: Total CK: 271 U/L — ABNORMAL HIGH (ref 32–182)

## 2023-03-13 LAB — SPECIMEN STATUS REPORT

## 2023-03-13 LAB — MAGNESIUM: Magnesium: 2.2 mg/dL (ref 1.6–2.3)

## 2023-03-20 NOTE — Progress Notes (Unsigned)
Patient ID: HARSHA Dawson                 DOB: Apr 27, 1946                    MRN: [id number]     HPI: Jasmine Dawson is a 77 y.o. female patient referred to lipid clinic by Dr. Pecola Leisure. PMH is significant for HTN, HLD, CKD IV, CVA, paroxysmal AF, G1DD, mod-severe TR, factov V Leiden, and prothrombin gene mutation. At her last visit in the Cardiology clinic with Dunn PA, patient had complaints of occasional muscle cramps that she felt was related to her rosuvastatin. At this time her rosuvastatin was reduced from 20 mg to 10 mg daily and follow-up BMET and CK were ordered. The BMET and CK ordered showed Scr was elevated to 2.24 (baseline ~1.8) with a CrCl 24 mL/min and an elevated CK at 271 U/L. At this time rosuvastatin therapy was stopped completely due to concerns for worsening muscle break down. Plans were made for the patient to follow-up in 2 weeks at the lipid clinic and to recheck CK.  At this visit...  LDL 77; goal 55 Medical therapy--Stopped crestor? Muscle cramps improved? Same pain with atorva previously? Simvastatin intolerance? Nonmedical therapy--diet, exercise, smoking, driking  PCSK9i: (additional 60% LDL reduction) MOA-PCSK9 is a protein responsible for breakdown of LDL receptors >> stopping breakdown of LDL receptors will allow them to break down LDL and reduce levels Tolerance-flu-like sx, muscle aches (<1%), injection site reaction (<1%)  Bempedoic acid: (additional 20% reduction) MOA-similar to statins by reducing production of LDL in liver Tolerance-reversible kidney damage, reversible liver damage  Ins: medicare supplement  Current Medications: none (holding rosuvastatin) Intolerances: rosuvastatin (myopathy, elevated CK), atorvastatin (myopathy), simvastatin Risk Factors: history of CVA, CKD, HTN, age 90+  LDL goal: <55 mg/dL  Diet:   Exercise:   Family History:   Social History:   Labs:  03/05/23--CK 271 U/L  Lipid Panel     Component Value  Date/Time   CHOL 180 03/05/2023 1031   TRIG 104 03/05/2023 1031   HDL 85 03/05/2023 1031   CHOLHDL 2.1 03/05/2023 1031   CHOLHDL 4.0 07/29/2018 0553   VLDL 19 07/29/2018 0553   LDLCALC 77 03/05/2023 1031   LABVLDL 18 03/05/2023 1031    Past Medical History:  Diagnosis Date   CKD (chronic kidney disease), stage IV (HCC)    Heterozygous factor V Leiden mutation (HCC)    Hyperlipidemia    Hypertension    Motion sickness    PAF (paroxysmal atrial fibrillation) (HCC)    Prothrombin gene mutation (HCC)    Pulmonary hypertension (HCC)    a. 2D echo 11/01/17 showed EF 65-70%, grade 1 DD, mod-severe TR, moderately increased PASP . b. F/u Echo 07/2018 not seen.   PVC's (premature ventricular contractions)    occasional PVCs   Sinus bradycardia    a. requiring reduction in beta blocker in 2020.   Stroke (cerebrum) (HCC)    SVT (supraventricular tachycardia) (HCC)    Tricuspid regurgitation     Current Outpatient Medications on File Prior to Visit  Medication Sig Dispense Refill   atenolol (TENORMIN) 50 MG tablet TAKE 1 TABLET(50 MG) BY MOUTH DAILY 90 tablet 3   Calcium Carbonate-Vitamin D (CALCIUM-VITAMIN D) 600-3.125 MG-MCG TABS Take 1 tablet by mouth daily.     carboxymethylcellulose (REFRESH PLUS) 0.5 % SOLN Place 1 drop into both eyes daily as needed (for dry eyes).     Chlorphen-PE-Acetaminophen (  CORICIDIN D COLD/FLU/SINUS PO) Take 2 tablets by mouth as needed (Allergies).     ELIQUIS 5 MG TABS tablet TAKE 1 TABLET(5 MG) BY MOUTH TWICE DAILY 60 tablet 5   famotidine (PEPCID) 20 MG tablet Take 20 mg by mouth daily.     fexofenadine-pseudoephedrine (ALLEGRA-D 24) 180-240 MG 24 hr tablet Take 1 tablet by mouth daily as needed (allergies).     meclizine (ANTIVERT) 12.5 MG tablet Take 25 mg by mouth 3 (three) times daily as needed for dizziness.     triamterene-hydrochlorothiazide (DYAZIDE) 37.5-25 MG capsule Take 1 capsule by mouth daily.     No current facility-administered  medications on file prior to visit.    Allergies  Allergen Reactions   Cefdinir     Other reaction(s): vomiting   Egg-Derived Products Anaphylaxis and Swelling   Orange Fruit [Citrus] Anaphylaxis and Swelling   Prednisone Anaphylaxis, Hives and Itching   Sulfa Antibiotics Anaphylaxis, Hives and Itching   Tomato Anaphylaxis and Swelling   Atorvastatin     Muscle cramps    Influenza Vaccines Other (See Comments)    unknown   Aspartame And Phenylalanine Palpitations   Caffeine Palpitations   Shellfish Allergy Itching, Nausea And Vomiting and Rash    Assessment/Plan:  1. Hyperlipidemia -  Reviewed options for lowering LDL cholesterol, including statins, ezetimibe, PCSK9 inhibitors, Nexletol/Nexlizet, and Leqvio. Discussed efficacy, dosing, side effects, and copay information.  Wilmer Floor, PharmD PGY2 Cardiology Pharmacy Resident

## 2023-03-22 ENCOUNTER — Encounter (HOSPITAL_BASED_OUTPATIENT_CLINIC_OR_DEPARTMENT_OTHER): Payer: Self-pay | Admitting: Emergency Medicine

## 2023-03-22 ENCOUNTER — Other Ambulatory Visit: Payer: Self-pay

## 2023-03-22 ENCOUNTER — Inpatient Hospital Stay (HOSPITAL_COMMUNITY): Payer: Medicare Other

## 2023-03-22 ENCOUNTER — Inpatient Hospital Stay (HOSPITAL_BASED_OUTPATIENT_CLINIC_OR_DEPARTMENT_OTHER)
Admission: EM | Admit: 2023-03-22 | Discharge: 2023-03-24 | DRG: 445 | Disposition: A | Discharge status: Home / Self Care | Payer: Medicare Other | Attending: Family Medicine | Admitting: Family Medicine

## 2023-03-22 ENCOUNTER — Emergency Department (HOSPITAL_BASED_OUTPATIENT_CLINIC_OR_DEPARTMENT_OTHER): Payer: Medicare Other

## 2023-03-22 DIAGNOSIS — K851 Biliary acute pancreatitis without necrosis or infection: Secondary | ICD-10-CM

## 2023-03-22 DIAGNOSIS — N184 Chronic kidney disease, stage 4 (severe): Secondary | ICD-10-CM | POA: Diagnosis present

## 2023-03-22 DIAGNOSIS — D6852 Prothrombin gene mutation: Secondary | ICD-10-CM | POA: Diagnosis present

## 2023-03-22 DIAGNOSIS — R001 Bradycardia, unspecified: Secondary | ICD-10-CM | POA: Diagnosis present

## 2023-03-22 DIAGNOSIS — Z91013 Allergy to seafood: Secondary | ICD-10-CM | POA: Diagnosis not present

## 2023-03-22 DIAGNOSIS — Z91018 Allergy to other foods: Secondary | ICD-10-CM

## 2023-03-22 DIAGNOSIS — Z91012 Allergy to eggs: Secondary | ICD-10-CM | POA: Diagnosis not present

## 2023-03-22 DIAGNOSIS — Z888 Allergy status to other drugs, medicaments and biological substances status: Secondary | ICD-10-CM | POA: Diagnosis not present

## 2023-03-22 DIAGNOSIS — Z86718 Personal history of other venous thrombosis and embolism: Secondary | ICD-10-CM

## 2023-03-22 DIAGNOSIS — K8000 Calculus of gallbladder with acute cholecystitis without obstruction: Principal | ICD-10-CM | POA: Diagnosis present

## 2023-03-22 DIAGNOSIS — Z833 Family history of diabetes mellitus: Secondary | ICD-10-CM

## 2023-03-22 DIAGNOSIS — I272 Pulmonary hypertension, unspecified: Secondary | ICD-10-CM | POA: Diagnosis present

## 2023-03-22 DIAGNOSIS — I480 Paroxysmal atrial fibrillation: Secondary | ICD-10-CM | POA: Diagnosis present

## 2023-03-22 DIAGNOSIS — N179 Acute kidney failure, unspecified: Secondary | ICD-10-CM | POA: Diagnosis present

## 2023-03-22 DIAGNOSIS — Z6830 Body mass index (BMI) 30.0-30.9, adult: Secondary | ICD-10-CM

## 2023-03-22 DIAGNOSIS — Z7901 Long term (current) use of anticoagulants: Secondary | ICD-10-CM | POA: Diagnosis not present

## 2023-03-22 DIAGNOSIS — K810 Acute cholecystitis: Secondary | ICD-10-CM | POA: Diagnosis present

## 2023-03-22 DIAGNOSIS — K5900 Constipation, unspecified: Secondary | ICD-10-CM | POA: Diagnosis present

## 2023-03-22 DIAGNOSIS — D6851 Activated protein C resistance: Secondary | ICD-10-CM | POA: Diagnosis present

## 2023-03-22 DIAGNOSIS — R1013 Epigastric pain: Secondary | ICD-10-CM | POA: Diagnosis not present

## 2023-03-22 DIAGNOSIS — K819 Cholecystitis, unspecified: Principal | ICD-10-CM

## 2023-03-22 DIAGNOSIS — E669 Obesity, unspecified: Secondary | ICD-10-CM | POA: Diagnosis present

## 2023-03-22 DIAGNOSIS — Z01810 Encounter for preprocedural cardiovascular examination: Secondary | ICD-10-CM

## 2023-03-22 DIAGNOSIS — K8020 Calculus of gallbladder without cholecystitis without obstruction: Secondary | ICD-10-CM | POA: Diagnosis not present

## 2023-03-22 DIAGNOSIS — Z8673 Personal history of transient ischemic attack (TIA), and cerebral infarction without residual deficits: Secondary | ICD-10-CM

## 2023-03-22 DIAGNOSIS — Z8249 Family history of ischemic heart disease and other diseases of the circulatory system: Secondary | ICD-10-CM

## 2023-03-22 DIAGNOSIS — D689 Coagulation defect, unspecified: Secondary | ICD-10-CM | POA: Diagnosis present

## 2023-03-22 DIAGNOSIS — Z79899 Other long term (current) drug therapy: Secondary | ICD-10-CM | POA: Diagnosis not present

## 2023-03-22 DIAGNOSIS — I129 Hypertensive chronic kidney disease with stage 1 through stage 4 chronic kidney disease, or unspecified chronic kidney disease: Secondary | ICD-10-CM | POA: Diagnosis present

## 2023-03-22 DIAGNOSIS — I700 Atherosclerosis of aorta: Secondary | ICD-10-CM | POA: Diagnosis present

## 2023-03-22 DIAGNOSIS — E785 Hyperlipidemia, unspecified: Secondary | ICD-10-CM | POA: Diagnosis present

## 2023-03-22 DIAGNOSIS — I071 Rheumatic tricuspid insufficiency: Secondary | ICD-10-CM | POA: Diagnosis present

## 2023-03-22 DIAGNOSIS — Z882 Allergy status to sulfonamides status: Secondary | ICD-10-CM | POA: Diagnosis not present

## 2023-03-22 LAB — URINALYSIS, ROUTINE W REFLEX MICROSCOPIC
Bilirubin Urine: NEGATIVE
Glucose, UA: NEGATIVE mg/dL
Hgb urine dipstick: NEGATIVE
Ketones, ur: NEGATIVE mg/dL
Leukocytes,Ua: NEGATIVE
Nitrite: NEGATIVE
Protein, ur: NEGATIVE mg/dL
Specific Gravity, Urine: 1.007 (ref 1.005–1.030)
pH: 6 (ref 5.0–8.0)

## 2023-03-22 LAB — CBC WITH DIFFERENTIAL/PLATELET
Abs Immature Granulocytes: 0.01 10*3/uL (ref 0.00–0.07)
Basophils Absolute: 0 10*3/uL (ref 0.0–0.1)
Basophils Relative: 1 %
Eosinophils Absolute: 0.1 10*3/uL (ref 0.0–0.5)
Eosinophils Relative: 2 %
HCT: 34 % — ABNORMAL LOW (ref 36.0–46.0)
Hemoglobin: 11.3 g/dL — ABNORMAL LOW (ref 12.0–15.0)
Immature Granulocytes: 0 %
Lymphocytes Relative: 27 %
Lymphs Abs: 1.8 10*3/uL (ref 0.7–4.0)
MCH: 28.5 pg (ref 26.0–34.0)
MCHC: 33.2 g/dL (ref 30.0–36.0)
MCV: 85.6 fL (ref 80.0–100.0)
Monocytes Absolute: 0.5 10*3/uL (ref 0.1–1.0)
Monocytes Relative: 8 %
Neutro Abs: 4.2 10*3/uL (ref 1.7–7.7)
Neutrophils Relative %: 62 %
Platelets: 237 10*3/uL (ref 150–400)
RBC: 3.97 MIL/uL (ref 3.87–5.11)
RDW: 13.8 % (ref 11.5–15.5)
WBC: 6.6 10*3/uL (ref 4.0–10.5)
nRBC: 0 % (ref 0.0–0.2)

## 2023-03-22 LAB — COMPREHENSIVE METABOLIC PANEL
ALT: 6 U/L (ref 0–44)
AST: 24 U/L (ref 15–41)
Albumin: 4.3 g/dL (ref 3.5–5.0)
Alkaline Phosphatase: 64 U/L (ref 38–126)
Anion gap: 13 (ref 5–15)
BUN: 41 mg/dL — ABNORMAL HIGH (ref 8–23)
CO2: 23 mmol/L (ref 22–32)
Calcium: 9.4 mg/dL (ref 8.9–10.3)
Chloride: 95 mmol/L — ABNORMAL LOW (ref 98–111)
Creatinine, Ser: 1.97 mg/dL — ABNORMAL HIGH (ref 0.44–1.00)
GFR, Estimated: 25 mL/min — ABNORMAL LOW (ref >60)
Glucose, Bld: 97 mg/dL (ref 70–99)
Potassium: 3.8 mmol/L (ref 3.5–5.1)
Sodium: 131 mmol/L — ABNORMAL LOW (ref 135–145)
Total Bilirubin: 0.4 mg/dL (ref <1.2)
Total Protein: 7.8 g/dL (ref 6.5–8.1)

## 2023-03-22 LAB — TROPONIN I (HIGH SENSITIVITY)
Troponin I (High Sensitivity): 6 ng/L (ref <18)
Troponin I (High Sensitivity): 7 ng/L (ref <18)

## 2023-03-22 LAB — HEPARIN LEVEL (UNFRACTIONATED): Heparin Unfractionated: >1.1 [IU]/mL — ABNORMAL HIGH (ref 0.30–0.70)

## 2023-03-22 LAB — LIPASE, BLOOD: Lipase: 162 U/L — ABNORMAL HIGH (ref 11–51)

## 2023-03-22 LAB — APTT: aPTT: 105 s — ABNORMAL HIGH (ref 24–36)

## 2023-03-22 MED ORDER — MORPHINE SULFATE (PF) 2 MG/ML IV SOLN: 2.0000 mg | INTRAVENOUS | Status: DC | PRN

## 2023-03-22 MED ORDER — SENNOSIDES-DOCUSATE SODIUM 8.6-50 MG PO TABS: 1.0000 | ORAL_TABLET | Freq: Every evening | ORAL | Status: DC | PRN

## 2023-03-22 MED ORDER — ACETAMINOPHEN 650 MG RE SUPP: 650.0000 mg | Freq: Four times a day (QID) | RECTAL | Status: DC | PRN

## 2023-03-22 MED ORDER — ONDANSETRON HCL 4 MG/2ML IJ SOLN
4.0000 mg | Freq: Four times a day (QID) | INTRAMUSCULAR | Status: DC | PRN
  Administered 2023-03-23: 4 mg via INTRAVENOUS
  Filled 2023-03-22: qty 2

## 2023-03-22 MED ORDER — TRAMADOL HCL 50 MG PO TABS: 50.0000 mg | ORAL_TABLET | Freq: Four times a day (QID) | ORAL | Status: DC | PRN

## 2023-03-22 MED ORDER — SODIUM CHLORIDE 0.9% FLUSH
3.0000 mL | Freq: Two times a day (BID) | INTRAVENOUS | Status: DC
  Administered 2023-03-22 (×2): 3 mL via INTRAVENOUS

## 2023-03-22 MED ORDER — ALBUTEROL SULFATE (2.5 MG/3ML) 0.083% IN NEBU: 2.5000 mg | INHALATION_SOLUTION | Freq: Four times a day (QID) | RESPIRATORY_TRACT | Status: DC | PRN

## 2023-03-22 MED ORDER — ALUM & MAG HYDROXIDE-SIMETH 200-200-20 MG/5ML PO SUSP
30.0000 mL | Freq: Once | ORAL | Status: AC
  Administered 2023-03-22: 30 mL via ORAL
  Filled 2023-03-22: qty 30

## 2023-03-22 MED ORDER — BISACODYL 10 MG RE SUPP: 10.0000 mg | Freq: Every day | RECTAL | Status: DC | PRN

## 2023-03-22 MED ORDER — HEPARIN (PORCINE) 25000 UT/250ML-% IV SOLN
700.0000 [IU]/h | INTRAVENOUS | Status: DC
  Administered 2023-03-22: 950 [IU]/h via INTRAVENOUS
  Administered 2023-03-23: 700 [IU]/h via INTRAVENOUS
  Filled 2023-03-22 (×2): qty 250

## 2023-03-22 MED ORDER — ONDANSETRON HCL 4 MG/2ML IJ SOLN
4.0000 mg | Freq: Once | INTRAMUSCULAR | Status: AC
  Administered 2023-03-22: 4 mg via INTRAVENOUS
  Filled 2023-03-22: qty 2

## 2023-03-22 MED ORDER — ONDANSETRON HCL 4 MG PO TABS
4.0000 mg | ORAL_TABLET | Freq: Four times a day (QID) | ORAL | Status: DC | PRN
  Administered 2023-03-22: 4 mg via ORAL
  Filled 2023-03-22: qty 1

## 2023-03-22 MED ORDER — ACETAMINOPHEN 325 MG PO TABS: 650.0000 mg | ORAL_TABLET | Freq: Four times a day (QID) | ORAL | Status: DC | PRN

## 2023-03-22 MED ORDER — HEPARIN (PORCINE) 25000 UT/250ML-% IV SOLN: 900.0000 [IU]/h | INTRAVENOUS | Status: DC

## 2023-03-22 MED ORDER — MORPHINE SULFATE (PF) 2 MG/ML IV SOLN
2.0000 mg | Freq: Once | INTRAVENOUS | Status: AC
  Administered 2023-03-22: 2 mg via INTRAVENOUS
  Filled 2023-03-22: qty 1

## 2023-03-22 MED ORDER — HEPARIN BOLUS VIA INFUSION
3250.0000 [IU] | Freq: Once | INTRAVENOUS | Status: DC
  Filled 2023-03-22: qty 3250

## 2023-03-22 MED ORDER — PIPERACILLIN-TAZOBACTAM 3.375 G IVPB
3.3750 g | Freq: Three times a day (TID) | INTRAVENOUS | Status: DC
  Administered 2023-03-22 – 2023-03-23 (×4): 3.375 g via INTRAVENOUS
  Filled 2023-03-22 (×5): qty 50

## 2023-03-22 MED ORDER — PIPERACILLIN-TAZOBACTAM 3.375 G IVPB 30 MIN: 3.3750 g | Freq: Three times a day (TID) | INTRAVENOUS | Status: DC

## 2023-03-22 MED ORDER — POLYETHYLENE GLYCOL 3350 17 G PO PACK
17.0000 g | PACK | Freq: Every day | ORAL | Status: DC
  Administered 2023-03-22 – 2023-03-23 (×2): 17 g via ORAL
  Filled 2023-03-22 (×2): qty 1

## 2023-03-22 MED ORDER — SODIUM CHLORIDE 0.9 % IV BOLUS
500.0000 mL | Freq: Once | INTRAVENOUS | Status: AC
  Administered 2023-03-22: 500 mL via INTRAVENOUS

## 2023-03-22 MED ORDER — PIPERACILLIN-TAZOBACTAM 3.375 G IVPB 30 MIN
3.3750 g | Freq: Once | INTRAVENOUS | Status: AC
  Administered 2023-03-22: 3.375 g via INTRAVENOUS
  Filled 2023-03-22: qty 50

## 2023-03-22 NOTE — ED Provider Notes (Signed)
[facility] EMERGENCY DEPARTMENT AT [facility] Provider Note  CSN: [id number] Arrival date & time: 03/22/23 0122  Chief Complaint(s) Abdominal Pain  HPI Jasmine Dawson is a 77 y.o. female with past medical history as below, significant for CKD, factor V Leyden, HLD, HTN, PAF (eliquis), pulmonary hypertension, CVA who presents to the ED with complaint of abdominal pain  Epigastric upper quadrant abdominal pain, nausea.  Worsened since eating dinner yesterday, she had a hamburger/vegan hamburger.  Bloating to her abdomen, sharp pain, nausea.  No fevers.  No change in bowel or bladder function.  No sick contacts.  Past Medical History Past Medical History:  Diagnosis Date   CKD (chronic kidney disease), stage IV (HCC)    Heterozygous factor V Leiden mutation (HCC)    Hyperlipidemia    Hypertension    Motion sickness    PAF (paroxysmal atrial fibrillation) (HCC)    Prothrombin gene mutation (HCC)    Pulmonary hypertension (HCC)    a. 2D echo 11/01/17 showed EF 65-70%, grade 1 DD, mod-severe TR, moderately increased PASP . b. F/u Echo 07/2018 not seen.   PVC's (premature ventricular contractions)    occasional PVCs   Sinus bradycardia    a. requiring reduction in beta blocker in 2020.   Stroke (cerebrum) [facility])    SVT (supraventricular tachycardia) (HCC)    Tricuspid regurgitation    Patient Active Problem List   Diagnosis Date Noted   Acute cholecystitis 03/22/2023   Paroxysmal atrial fibrillation (HCC) 08/24/2019   Secondary hypercoagulable state (HCC) 08/24/2019   Hypertensive urgency    Right arm weakness    Cerebral embolism with cerebral infarction 07/29/2018   Weakness 07/28/2018   Chest pain 10/31/2017   Palpitations 10/31/2017   Right leg pain 10/31/2017   Elevated troponin 10/31/2017   SVT (supraventricular tachycardia) (HCC) 10/03/2016   Chronic kidney disease, stage 3b (HCC) 10/03/2016   Endometrial polyp 11/07/2011   Dizzy spells 09/11/2011    Essential hypertension 09/11/2011   Abdominal pain 09/11/2011   Cholelithiasis 09/11/2011   Home Medication(s) Prior to Admission medications   Medication Sig Start Date End Date Taking? Authorizing Provider  atenolol (TENORMIN) 50 MG tablet TAKE 1 TABLET(50 MG) BY MOUTH DAILY 07/03/22   Camnitz, Andree Coss, MD  Calcium Carbonate-Vitamin D (CALCIUM-VITAMIN D) 600-3.125 MG-MCG TABS Take 1 tablet by mouth daily.    [provider]  carboxymethylcellulose (REFRESH PLUS) 0.5 % SOLN Place 1 drop into both eyes daily as needed (for dry eyes).    [provider]  Chlorphen-PE-Acetaminophen (CORICIDIN D COLD/FLU/SINUS PO) Take 2 tablets by mouth as needed (Allergies).    [provider]  ELIQUIS 5 MG TABS tablet TAKE 1 TABLET(5 MG) BY MOUTH TWICE DAILY 02/23/23   Camnitz, Andree Coss, MD  famotidine (PEPCID) 20 MG tablet Take 20 mg by mouth daily.    [provider]  fexofenadine-pseudoephedrine (ALLEGRA-D 24) 180-240 MG 24 hr tablet Take 1 tablet by mouth daily as needed (allergies).    [provider]  meclizine (ANTIVERT) 12.5 MG tablet Take 25 mg by mouth 3 (three) times daily as needed for dizziness. 12/06/21   [provider]  triamterene-hydrochlorothiazide (DYAZIDE) 37.5-25 MG capsule Take 1 capsule by mouth daily.    [provider]  Past Surgical History Past Surgical History:  Procedure Laterality Date   HYSTEROSCOPY WITH D & C  11/07/2011   Procedure: DILATATION AND CURETTAGE /HYSTEROSCOPY;  Surgeon: Antionette Char, MD;  Location: [facility] ORS;  Service: Gynecology;  Laterality: N/A;  endometrial polypectomy   LOOP RECORDER INSERTION N/A 07/30/2018   Procedure: LOOP RECORDER INSERTION;  Surgeon: Regan Lemming, MD;  Location: [facility] INVASIVE CV LAB;  Service: Cardiovascular;  Laterality: N/A;   TUBAL  LIGATION  1977   Family History Family History  Problem Relation Age of Onset   Diabetes Mother        died at 33   Heart attack Mother    Heart attack Father        died at 38    Social History Social History   Tobacco Use   Smoking status: Never   Smokeless tobacco: Never  Vaping Use   Vaping status: Never Used  Substance Use Topics   Alcohol use: No   Drug use: No   Allergies Cefdinir, Egg-derived products, Orange fruit [citrus], Prednisone, Sulfa antibiotics, Tomato, Atorvastatin, Influenza vaccines, Aspartame and phenylalanine, Caffeine, and Shellfish allergy  Review of Systems Review of Systems  Constitutional:  Negative for chills and fever.  Respiratory:  Negative for chest tightness and shortness of breath.   Cardiovascular:  Negative for chest pain.  Gastrointestinal:  Positive for abdominal pain and nausea. Negative for vomiting.  Genitourinary:  Negative for difficulty urinating and dysuria.  Skin:  Negative for rash.  All other systems reviewed and are negative.   Physical Exam Vital Signs  I have reviewed the triage vital signs BP (!) 120/52   Pulse (!) 50   Temp 98.4 F (36.9 C)   Resp 14   Ht 5' 1.5" (1.562 m)   Wt 75.4 kg   SpO2 100%   BMI 30.91 kg/m  Physical Exam Vitals and nursing note reviewed.  Constitutional:      General: She is not in acute distress.    Appearance: Normal appearance. She is well-developed. She is obese.  HENT:     Head: Normocephalic and atraumatic.     Right Ear: External ear normal.     Left Ear: External ear normal.     Nose: Nose normal.     Mouth/Throat:     Mouth: Mucous membranes are moist.  Eyes:     General: No scleral icterus.       Right eye: No discharge.        Left eye: No discharge.  Cardiovascular:     Rate and Rhythm: Normal rate and regular rhythm.     Pulses: Normal pulses.     Heart sounds: Normal heart sounds.  Pulmonary:     Effort: Pulmonary effort is normal. No respiratory  distress.     Breath sounds: Normal breath sounds. No stridor.  Abdominal:     General: Abdomen is flat. There is no distension.     Palpations: Abdomen is soft.     Tenderness: There is abdominal tenderness in the right upper quadrant and epigastric area. Positive signs include Murphy's sign. Negative signs include Rovsing's sign.  Musculoskeletal:     Cervical back: No rigidity.     Right lower leg: No edema.     Left lower leg: No edema.  Skin:    General: Skin is warm and dry.     Capillary Refill: Capillary refill takes less than 2 seconds.  Neurological:     Mental Status: She is  alert.  Psychiatric:        Mood and Affect: Mood normal.        Behavior: Behavior normal. Behavior is cooperative.     ED Results and Treatments Labs (all labs ordered are listed, but only abnormal results are displayed) Labs Reviewed  CBC WITH DIFFERENTIAL/PLATELET - Abnormal; Notable for the following components:      Result Value   Hemoglobin 11.3 (*)    HCT 34.0 (*)    All other components within normal limits  COMPREHENSIVE METABOLIC PANEL - Abnormal; Notable for the following components:   Sodium 131 (*)    Chloride 95 (*)    BUN 41 (*)    Creatinine, Ser 1.97 (*)    GFR, Estimated 25 (*)    All other components within normal limits  LIPASE, BLOOD - Abnormal; Notable for the following components:   Lipase 162 (*)    All other components within normal limits  URINALYSIS, ROUTINE W REFLEX MICROSCOPIC - Abnormal; Notable for the following components:   Color, Urine COLORLESS (*)    All other components within normal limits  TROPONIN I (HIGH SENSITIVITY)  TROPONIN I (HIGH SENSITIVITY)                                                                                                                          Radiology CT ABDOMEN PELVIS WO CONTRAST  Result Date: 03/22/2023 CLINICAL DATA:  77 year old female with epigastric pain since 1630 hours. Abdominal swelling and nausea. History of  large liver cyst. EXAM: CT ABDOMEN AND PELVIS WITHOUT CONTRAST TECHNIQUE: Multidetector CT imaging of the abdomen and pelvis was performed following the standard protocol without IV contrast. RADIATION DOSE REDUCTION: This exam was performed according to the departmental dose-optimization program which includes automated exposure control, adjustment of the mA and/or kV according to patient size and/or use of iterative reconstruction technique. COMPARISON:  Noncontrast CT Abdomen and Pelvis 12/12/2021. FINDINGS: Lower chest: Mild chronic left lower lobe linear atelectasis or scarring. No cardiomegaly, pericardial or pleural effusion. Hepatobiliary: Cholelithiasis with individual gallstones up to 11 mm. Gallbladder is larger, and there is evidence of abnormal gallbladder wall thickening or inflammation on coronal image 70. No bile duct dilatation. Chronic but enlarging hepatic cyst 1st visible in 2018 again has a bilobed appearance, arising to the right of the falciform ligament. Currently the cyst is up to 70 mm long axis and estimated cyst volume is 120 mL (versus 140 mL last year. Mild distortion on the hepatic contour appears stable. No perihepatic fluid. And otherwise negative noncontrast liver parenchyma. Pancreas: Negative noncontrast appearance. Spleen: Negative. Adrenals/Urinary Tract: Negative adrenal glands and left kidney. Right renal posterior midpole simple fluid density cyst (no follow-up imaging recommended). No nephrolithiasis or hydronephrosis. Decompressed ureters and bladder. Stable pelvic phleboliths. Stomach/Bowel: Increased stool ball in the rectum (sagittal image 83). Upstream sigmoid colon retained stool and mild redundancy. Descending colon is decompressed. Ordinary transverse and right colon retained gas and stool. Normal appendix  on series 2, image 49. Decompressed terminal ileum. No dilated small bowel. Noncontrast stomach and duodenum appear negative. Vascular/Lymphatic: Extensive  Aortoiliac calcified atherosclerosis. Normal caliber abdominal aorta. Vascular patency is not evaluated in the absence of IV contrast. No lymphadenopathy identified. Reproductive: Stable and negative noncontrast appearance. Other: No pelvis free fluid. Musculoskeletal: Lower thoracic interbody ankylosis from flowing endplate osteophytes. Degenerative interbody ankylosis in the lower lumbar spine at L4-L5. Additional lumbar degeneration. No acute osseous abnormality identified. IMPRESSION: 1. Chronic cholelithiasis with increased gallbladder distension since last year and questionable early gallbladder inflammation by CT. Consider Acute Cholecystitis and right upper quadrant ultrasound would be valuable. 2. No other acute finding in the noncontrast Abdomen. 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 No complicating features are evident. 3. Stool ball in the rectum, cannot exclude fecal impaction. But no bowel obstruction. Normal appendix. 4.  Aortic Atherosclerosis (ICD10-I70.0). Electronically Signed   By: Odessa Fleming M.D.   On: 03/22/2023 04:52    Pertinent labs & imaging results that were available during my care of the patient were reviewed by me and considered in my medical decision making (see MDM for details).  Medications Ordered in ED Medications  piperacillin-tazobactam (ZOSYN) IVPB 3.375 g (3.375 g Intravenous New Bag/Given 03/22/23 0551)  alum & mag hydroxide-simeth (MAALOX/MYLANTA) 200-200-20 MG/5ML suspension 30 mL (30 mLs Oral Given 03/22/23 0226)  sodium chloride 0.9 % bolus 500 mL (500 mLs Intravenous New Bag/Given 03/22/23 0547)  morphine (PF) 2 MG/ML injection 2 mg (2 mg Intravenous Given 03/22/23 0546)  ondansetron (ZOFRAN) injection 4 mg (4 mg Intravenous Given 03/22/23 0544)                                                                                                                                      Procedures Procedures  (including critical care time)  Medical Decision Making / ED Course    Medical Decision Making:    ATARA STANKIEWICZ is a 77 y.o. female with past medical history as below, significant for CKD, factor V Leyden, HLD, HTN, PAF (eliquis), pulmonary hypertension, CVA who presents to the ED with complaint of abdominal pain. The complaint involves an extensive differential diagnosis and also carries with it a high risk of complications and morbidity.  Serious etiology was considered. Ddx includes but is not limited to: Differential diagnosis includes but is not exclusive to acute cholecystitis, intrathoracic causes for epigastric abdominal pain, gastritis, duodenitis, pancreatitis, small bowel or large bowel obstruction, abdominal aortic aneurysm, hernia, gastritis, etc. Differential diagnosis includes but is not exclusive to ectopic pregnancy, ovarian cyst, ovarian torsion, acute appendicitis, urinary tract infection, endometriosis, bowel obstruction, hernia, colitis, renal colic, gastroenteritis, volvulus etc.   Complete initial physical exam performed, notably the patient was in no acute distress, HDS.    Reviewed and confirmed nursing documentation for past medical history, family history, social history.  Vital signs reviewed.      Clinical Course as of 03/22/23 0602  Sun Mar 22, 2023  0351 GFR, Estimated(!): 25 Similar to prior  [SG]  0352 Lipase(!): 162 Elev from prior, pain to epigastrium, no emesis [SG]  0459 CT concern for possible early cholecystitis, she has epigastric/upper quadrant abdominal pain postprandial.  Her labs are stable.   [SG]  0534 Spoke w/ Dr Bedelia Person, recommend [facility], they will see in consult [SG]    Clinical Course User Index [SG] Sloan Leiter, DO    Brief summary: 77 year old female history as above here with epigastric upper quadrant abdominal pain postprandial. Symptoms improved following GI cocktail.  Elevated lipase 162, CT  concerning for cholecystitis.  Elevated lipase may be secondary to gallstone pancreatitis.  She has Murphy sign on exam. No fever or jaundice. She is agreeable to admission.  Will start antibiotics, consult general surgery, plan medical admission (send to [facility] Accepted Dr Loney Loh [facility]   Last dose of Eliquis was 8 PM last night Last p.o. 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   The pertinent results include:   Labs Reviewed  CBC WITH DIFFERENTIAL/PLATELET - Abnormal; Notable for the following components:      Result Value   Hemoglobin 11.3 (*)    HCT 34.0 (*)    All other components within normal limits  COMPREHENSIVE METABOLIC PANEL - Abnormal; Notable for the following components:   Sodium 131 (*)    Chloride 95 (*)    BUN 41 (*)    Creatinine, Ser 1.97 (*)    GFR, Estimated 25 (*)    All other components within normal limits  LIPASE, BLOOD - Abnormal; Notable for the following components:   Lipase 162 (*)    All other components within normal limits  URINALYSIS, ROUTINE W REFLEX MICROSCOPIC - Abnormal; Notable for the following components:   Color, Urine COLORLESS (*)    All other components within normal limits  TROPONIN I (HIGH SENSITIVITY)  TROPONIN I (HIGH SENSITIVITY)    Notable for lipase +  EKG   EKG Interpretation Date/Time:    Ventricular Rate:    PR Interval:    QRS Duration:    QT Interval:    QTC Calculation:   R Axis:      Text Interpretation:            Imaging Studies ordered: I ordered imaging studies including CTAP I independently visualized the following imaging with scope of interpretation limited to determining acute life threatening conditions related to emergency care; findings noted above I independently visualized and interpreted imaging. I agree with the radiologist interpretation   Medicines ordered and prescription drug management: Meds ordered this encounter  Medications   alum & mag hydroxide-simeth (MAALOX/MYLANTA) 200-200-20 MG/5ML suspension 30 mL   sodium chloride 0.9 % bolus 500 mL   morphine (PF) 2 MG/ML injection 2 mg   ondansetron (ZOFRAN) injection 4 mg   piperacillin-tazobactam (ZOSYN) IVPB 3.375 g    Order Specific Question:  Antibiotic Indication:    Answer:   Intra-abdominal Infection    -I have reviewed the patients home medicines and have made adjustments as needed   Consultations Obtained: I requested consultation with the gen surg Dr Bedelia Person,  and discussed lab and imaging findings as well as pertinent plan - they recommend: send to [facility], will see in consult    Cardiac Monitoring: The patient was maintained sion a cardiac monitor.  I personally viewed and interpreted the cardiac monitored which showed an underlying rhythm of: sinus  brady Continuous pulse oximetry interpreted by myself, 99% on RA.    Social Determinants of Health:  Diagnosis or treatment significantly limited by social determinants of health: obesity   Reevaluation: After the interventions noted above, I reevaluated the patient and found that they have improved  Co morbidities that complicate the patient evaluation  Past Medical History:  Diagnosis Date   CKD (chronic kidney disease), stage IV (HCC)    Heterozygous factor V Leiden mutation (HCC)    Hyperlipidemia    Hypertension    Motion sickness    PAF (paroxysmal atrial fibrillation) (HCC)    Prothrombin gene mutation (HCC)    Pulmonary hypertension (HCC)     a. 2D echo 11/01/17 showed EF 65-70%, grade 1 DD, mod-severe TR, moderately increased PASP . b. F/u Echo 07/2018 not seen.   PVC's (premature ventricular contractions)    occasional PVCs   Sinus bradycardia    a. requiring reduction in beta blocker in 2020.   Stroke (cerebrum) (HCC)    SVT (supraventricular tachycardia) (HCC)    Tricuspid regurgitation       Dispostion: Disposition decision including need for hospitalization was considered, and patient admitted to the hospital.    Final Clinical Impression(s) / ED Diagnoses Final diagnoses:  Cholecystitis  Acute biliary pancreatitis without infection or necrosis        Sloan Leiter, DO 03/22/23 0602

## 2023-03-22 NOTE — ED Notes (Addendum)
[facility] called for report. ETA 10 minutes. Patient notified of transport ETA.

## 2023-03-22 NOTE — ED Triage Notes (Signed)
C/o abd cramping that started around 430pm Saturday. States she feels like her abd is "swollen" c/o nausea. Denies vomiting. Has not taken anything for pain. Denies any urinary symptoms. Denies fevers.

## 2023-03-22 NOTE — ED Notes (Signed)
ED Provider at bedside. 

## 2023-03-22 NOTE — Progress Notes (Signed)
ED Pharmacy Antibiotic Sign Off An antibiotic consult was received from an ED provider for zosyn per pharmacy dosing for intra-abdominal infection. A chart review was completed to assess appropriateness.   The following one time order(s) were placed:   -Zosyn 3.375gm IV x1  Further antibiotic and/or antibiotic pharmacy consults should be ordered by the admitting provider if indicated.   Thank you for allowing pharmacy to be a part of this patient's care.   Arabella Merles, [facility]  Clinical Pharmacist 03/22/23 5:23 AM

## 2023-03-22 NOTE — Progress Notes (Addendum)
PHARMACY - ANTICOAGULATION CONSULT NOTE  Pharmacy Consult for heparin gtt Indication: atrial fibrillation  Allergies  Allergen Reactions   Cefdinir     Other reaction(s): vomiting   Egg-Derived Products Anaphylaxis and Swelling   Orange Fruit [Citrus] Anaphylaxis and Swelling   Prednisone Anaphylaxis, Hives and Itching   Sulfa Antibiotics Anaphylaxis, Hives and Itching   Tomato Anaphylaxis and Swelling   Atorvastatin     Muscle cramps    Influenza Vaccines Other (See Comments)    unknown   Aspartame And Phenylalanine Palpitations   Caffeine Palpitations   Shellfish Allergy Itching, Nausea And Vomiting and Rash    Patient Measurements: Height: 5' 1.5" (156.2 cm) Weight: 75.3 kg (166 lb) IBW/kg (Calculated) : 48.95 Heparin Dosing Weight: 65.47  Vital Signs: Temp: 98.9 F (37.2 C) (11/24 0943) Temp Source: Oral (11/24 0943) BP: 127/51 (11/24 0943) Pulse Rate: 45 (11/24 0943)  Labs: Recent Labs    03/22/23 0218 03/22/23 0353  HGB 11.3*  --   HCT 34.0*  --   PLT 237  --   CREATININE 1.97*  --   TROPONINIHS 6 7    Estimated Creatinine Clearance: 21.8 mL/min (A) (by C-G formula based on SCr of 1.97 mg/dL (H)).   Medical History: Past Medical History:  Diagnosis Date   CKD (chronic kidney disease), stage IV (HCC)    Heterozygous factor V Leiden mutation (HCC)    Hyperlipidemia    Hypertension    Motion sickness    PAF (paroxysmal atrial fibrillation) (HCC)    Prothrombin gene mutation (HCC)    Pulmonary hypertension (HCC)    a. 2D echo 11/01/17 showed EF 65-70%, grade 1 DD, mod-severe TR, moderately increased PASP . b. F/u Echo 07/2018 not seen.   PVC's (premature ventricular contractions)    occasional PVCs   Sinus bradycardia    a. requiring reduction in beta blocker in 2020.   Stroke (cerebrum) (HCC)    SVT (supraventricular tachycardia) (HCC)    Tricuspid regurgitation     Medications:  Scheduled:   heparin  3,250 Units Intravenous Once    polyethylene glycol  17 g Oral Daily   sodium chloride flush  3 mL Intravenous Q12H   PRN: acetaminophen **OR** acetaminophen, albuterol, bisacodyl, morphine injection, ondansetron **OR** ondansetron (ZOFRAN) IV, traMADol  Assessment: 77 yo female admitted for cholelithiasis with abdominal pain. PMH of Factor V Leiden and pAF on eliquis. Last dose 11/23 at 8pm. Will defer bolus. Pharmacy consulted for heparin dosing. CBC ok  Goal of Therapy:  Heparin level 0.3-0.7 units/ml aPTT 66-102 seconds Monitor platelets by anticoagulation protocol: Yes   Plan:  No bolus given recent Eliquis Start heparin infusion at 950 units/hr Check aPTT & anti-Xa level in 8 hours and daily while on heparin Continue to monitor via aPTT until levels are correlated Continue to monitor H&H and platelets  Kayhan Boardley 03/22/2023,10:26 AM

## 2023-03-22 NOTE — Progress Notes (Signed)
PHARMACY - ANTICOAGULATION CONSULT NOTE  Pharmacy Consult for IV Heparin  Indication: atrial fibrillation  Allergies  Allergen Reactions   Cefdinir     Other reaction(s): vomiting   Egg-Derived Products Anaphylaxis and Swelling   Orange Fruit [Citrus] Anaphylaxis and Swelling   Prednisone Anaphylaxis, Hives and Itching   Sulfa Antibiotics Anaphylaxis, Hives and Itching   Tomato Anaphylaxis and Swelling   Atorvastatin     Muscle cramps    Influenza Vaccines Other (See Comments)    unknown   Aspartame And Phenylalanine Palpitations   Caffeine Palpitations   Shellfish Allergy Itching, Nausea And Vomiting and Rash    Patient Measurements: Height: 5' 1.5" (156.2 cm) Weight: 75.3 kg (166 lb) IBW/kg (Calculated) : 48.95 Heparin Dosing Weight: 65 kg  Vital Signs: Temp: 97.6 F (36.4 C) (11/24 1935) Temp Source: Oral (11/24 1935) BP: 109/55 (11/24 1935) Pulse Rate: 50 (11/24 1935)  Labs: Recent Labs    03/22/23 0218 03/22/23 0353 03/22/23 1957  HGB 11.3*  --   --   HCT 34.0*  --   --   PLT 237  --   --   APTT  --   --  105*  HEPARINUNFRC  --   --  >1.10*  CREATININE 1.97*  --   --   TROPONINIHS 6 7  --     Estimated Creatinine Clearance: 21.8 mL/min (A) (by C-G formula based on SCr of 1.97 mg/dL (H)).   Medical History: Past Medical History:  Diagnosis Date   CKD (chronic kidney disease), stage IV (HCC)    Heterozygous factor V Leiden mutation (HCC)    Hyperlipidemia    Hypertension    Motion sickness    PAF (paroxysmal atrial fibrillation) (HCC)    Prothrombin gene mutation (HCC)    Pulmonary hypertension (HCC)    a. 2D echo 11/01/17 showed EF 65-70%, grade 1 DD, mod-severe TR, moderately increased PASP . b. F/u Echo 07/2018 not seen.   PVC's (premature ventricular contractions)    occasional PVCs   Sinus bradycardia    a. requiring reduction in beta blocker in 2020.   Stroke (cerebrum) (HCC)    SVT (supraventricular tachycardia) (HCC)    Tricuspid  regurgitation    Assessment: 77 yo Dawson admitted for cholelithiasis with abdominal pain. PMH of Factor V Leiden and pAF on eliquis. Last dose 11/23 at 8pm. Will defer bolus. Pharmacy consulted for heparin dosing. CBC ok. aPTT above goal 105 on heparin 950 units/hr. NO issue with heparin infusion or bleeding reported.  Goal of Therapy:  Heparin level 0.3-0.7 units/ml aPTT 66-102 seconds Monitor platelets by anticoagulation protocol: Yes   Plan:  No bolus given recent Eliquis Decrease heparin infusion to 800 units/hr Check heparin level and aPTT in 8 hours and daily while on heparin Continue to monitor H&H and platelets  Thank you for allowing pharmacy to be a part of this patient's care.  Thelma Barge, PharmD Clinical Pharmacist

## 2023-03-22 NOTE — H&P (Addendum)
History and Physical    Patient: Jasmine Dawson ZOX:[id number] DOB: 1945/10/10 DOA: 03/22/2023 DOS: the patient was seen and examined on 03/22/2023 PCP: Leilani Able, MD  Patient coming from: Transfer from [facility]  Chief Complaint:  Chief Complaint  Patient presents with   Abdominal Pain   HPI: Jasmine Dawson is a 77 y.o. female with medical history significant of CKD stage IV, heterozygous factor V Leiden mutation, prothrombin gene mutation, hyperlipidemia, hypertension, paroxysmal A-fib on Eliquis, pulmonary hypertension, PVCs, sinus bradycardia, stroke, DVT, PE, SVT, and tricuspid regurgitation who presented with complaints of abdominal pain.  The abdominal pain started yesterday around 5 p.m., approximately one hour after consuming an Impossible Burger for the first time. The patient reports pain in the upper middle part of the stomach, describing it as a combination of sharp and dull pain, without radiation. The patient denies associated symptoms such as nausea, vomiting, diarrhea, or fever. However, the patient reports constipation with the last bowel movement occurring two days ago. The patient denies any recent medication changes, leg swelling, or calf pain.  Notes history of cholelithiasis with intermittent abdominal pains previously in the past, but nothing like she experienced yesterday.  She is on Eliquis and last took the medication yesterday evening at 8 PM.  In the ED was noted to be afebrile, slightly bradycardic.  Labs showing no leukocytosis, sodium 131, creatinine 1.9 (stable), lipase 162, LFTs normal, troponin negative x 2, UA not suggestive of infection.   CT abdomen pelvis without contrast showing: IMPRESSION: 1. Chronic cholelithiasis with increased gallbladder distension since last year and questionable early gallbladder inflammation by CT.  Consider Acute Cholecystitis and right upper quadrant ultrasound would be valuable.   2. No other acute finding in the  noncontrast Abdomen. 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 No complicating features are evident.   3. Stool ball in the rectum, cannot exclude fecal impaction. But no bowel obstruction. Normal appendix.   4.  Aortic Atherosclerosis (ICD10-I70.0).   Patient was given Maalox, morphine, Zofran, Zosyn, and 500 mL normal saline.  Given findings concerning for acute cholecystitis on CT, EDP spoke to Dr. Bedelia Person with general surgery who recommended holding Eliquis and admitting the patient to [facility].  General surgery will consult.   Review of Systems:   - Gastrointestinal: Swollen stomach, pain in upper middle part of stomach (both sharp and dull), constipation, feeling of needing to vomit but unable to - Cardiovascular: No shortness of breath - Musculoskeletal: No leg swelling - General: No fever - Genitourinary: No urinary symptoms reported Otherwise as mentioned in the history of present illness. All other systems reviewed and are negative. Past Medical History:  Diagnosis Date   CKD (chronic kidney disease), stage IV (HCC)    Heterozygous factor V Leiden mutation (HCC)    Hyperlipidemia    Hypertension    Motion sickness    PAF (paroxysmal atrial fibrillation) (HCC)    Prothrombin gene mutation (HCC)    Pulmonary hypertension (HCC)    a. 2D echo 11/01/17 showed EF 65-70%, grade 1 DD, mod-severe TR, moderately increased PASP . b. F/u Echo 07/2018 not seen.   PVC's (premature ventricular contractions)    occasional PVCs   Sinus bradycardia    a. requiring reduction in beta blocker in 2020.   Stroke (cerebrum) (HCC)    SVT (supraventricular tachycardia) (HCC)    Tricuspid regurgitation    Past Surgical History:  Procedure Laterality Date  HYSTEROSCOPY WITH D & C  11/07/2011   Procedure: DILATATION AND CURETTAGE /HYSTEROSCOPY;  Surgeon: Antionette Char, MD;  Location: [facility];  Service: Gynecology;  Laterality:  N/A;  endometrial polypectomy   LOOP RECORDER INSERTION N/A 07/30/2018   Procedure: LOOP RECORDER INSERTION;  Surgeon: Regan Lemming, MD;  Location: [facility];  Service: Cardiovascular;  Laterality: N/A;   TUBAL LIGATION  1977   Social History:  reports that she has never smoked. She has never used smokeless tobacco. She reports that she does not drink alcohol and does not use drugs.  Allergies  Allergen Reactions   Cefdinir     Other reaction(s): vomiting   Egg-Derived Products Anaphylaxis and Swelling   Orange Fruit [Citrus] Anaphylaxis and Swelling   Prednisone Anaphylaxis, Hives and Itching   Sulfa Antibiotics Anaphylaxis, Hives and Itching   Tomato Anaphylaxis and Swelling   Atorvastatin     Muscle cramps    Influenza Vaccines Other (See Comments)    unknown   Aspartame And Phenylalanine Palpitations   Caffeine Palpitations   Shellfish Allergy Itching, Nausea And Vomiting and Rash    Family History  Problem Relation Age of Onset   Diabetes Mother        died at 59   Heart attack Mother    Heart attack Father        died at 63    Prior to Admission medications   Medication Sig Start Date End Date Taking? Authorizing Provider  atenolol (TENORMIN) 50 MG tablet TAKE 1 TABLET(50 MG) BY MOUTH DAILY 07/03/22   Camnitz, Andree Coss, MD  Calcium Carbonate-Vitamin D (CALCIUM-VITAMIN D) 600-3.125 MG-MCG TABS Take 1 tablet by mouth daily.    [provider]  carboxymethylcellulose (REFRESH PLUS) 0.5 % SOLN Place 1 drop into both eyes daily as needed (for dry eyes).    [provider]  Chlorphen-PE-Acetaminophen (CORICIDIN D COLD/FLU/SINUS PO) Take 2 tablets by mouth as needed (Allergies).    [provider]  ELIQUIS 5 MG TABS tablet TAKE 1 TABLET(5 MG) BY MOUTH TWICE DAILY 02/23/23   Camnitz, Andree Coss, MD  famotidine (PEPCID) 20 MG tablet Take 20 mg by mouth daily.    [provider]  fexofenadine-pseudoephedrine (ALLEGRA-D 24)  180-240 MG 24 hr tablet Take 1 tablet by mouth daily as needed (allergies).    [provider]  meclizine (ANTIVERT) 12.5 MG tablet Take 25 mg by mouth 3 (three) times daily as needed for dizziness. 12/06/21   [provider]  triamterene-hydrochlorothiazide (DYAZIDE) 37.5-25 MG capsule Take 1 capsule by mouth daily.    [provider]    Physical Exam: Vitals:   03/22/23 0630 03/22/23 0700 03/22/23 0730 03/22/23 0842  BP: (!) 115/50 (!) 111/54 (!) 116/52 (!) 123/57  Pulse: (!) 50 (!) 47 (!) 48   Resp:    16  Temp: 98.6 F (37 C)   98.9 F (37.2 C)  TempSrc: Oral   Oral  SpO2: 98% 98% 100%   Weight:    75.3 kg  Height:    5' 1.5" (1.562 m)     Constitutional: Elderly female currently in NAD, calm, comfortable Eyes: PERRL, lids and conjunctivae normal ENMT: Mucous membranes are moist.  Normal dentition.  Neck: normal, supple  Respiratory: clear to auscultation bilaterally, no wheezing, no crackles. Normal respiratory effort. No accessory muscle use.  Cardiovascular: Bradycardic. No extremity edema. 2+ pedal pulses.  Abdomen: Mild tenderness to palpation in the right upper quadrant/epigastrically.  Bowel sounds  positive.  Musculoskeletal: no clubbing / cyanosis. No joint deformity upper and lower extremities. Good ROM, no contractures. Normal muscle tone.  Skin: no rashes, lesions, ulcers. No induration Neurologic: CN 2-12 grossly intact. Sensation intact, DTR normal. Strength 5/5 in all 4.  Psychiatric: Normal judgment and insight. Alert and oriented x 3. Normal mood.   Data Reviewed:   EKG reveals a sinus rhythm at 52 bpm with first-degree heart block.  Reviewed labs, imaging, and pertinent records as documented  Assessment and Plan:  Epigastric pain  Cholelithiasis with possible cholecystitis Acute.  Patient presented with abdominal swelling and pain in the upper middle part of the stomach, which started yesterday around 5 p.m., about an hour after  eating an Impossible Burger for the first time.  LFT is within normal limits with mildly elevated lipase.  CT scan noted chronic cholelithiasis with increased gallbladder distension since last year, with concern for acute cholecystitis. Ultrasound showed gallstones, which the patient reports having for a long time. Physical exam revealed tenderness to palpation in the right upper quadrant and epigastrically.  The differential includes possible pancreatitis, but was not noted on CT. Plan: - Admit to a telemetry bed - Clear liquid diet for now - Follow-up right upper quadrant ultrasound - Plan to check lipid panel in a.m. - Continue IV Zosyn for potential gallbladder infection - Await General Surgery consultation for recommendations on next steps which could include need of HIDA scan for further evaluation - Pain medicine and anti-nausea medicine available as needed  Clotting disorder History of DVT Paroxysmal atrial fibrillation on anticoagulation  Patient has a history of clotting disorder and is on Eliquis for paroxysmal atrial fibrillation.  Appears to be in sinus bradycardia at this time with stable blood pressure.  Last dose of Eliquis was taken at 8 p.m. last night. Patient has a history of blood clots that embolized while in the hospital. Due to potential surgical intervention, anticoagulation management is necessary. Plan: - Held Eliquis - Initiate heparin drip per pharmacy for bridging anticoagulation - Monitor blood counts and levels to ensure appropriate anticoagulation - Continue outpatient follow-up with hematology regarding long-term anticoagulation management  Chronic Kidney Disease - Stage 4 Assessment: Patient has a history of stage 4 chronic kidney disease. Current creatinine is 1.97, similar to baseline of 2 to 2.3.  Patient follows with Dr.  Arrie Aran at [facility]. Plan: - Continue fluid management - Monitor renal function - Continue outpatient follow-up  with nephrology  Constipation Assessment: Patient reports constipation with last bowel movement two days ago. Abdominal exam reveals good bowel sounds in all quadrants. Plan: - Monitor bowel movements -Senokot-S added as needed  DVT prophylaxis: Heparin  Advance Care Planning:   Code Status: Full Code    Consults: General Surgery  Family Communication: Attempted to call husband twice, but went straight to voicemail and therefore voicemail message left regarding patient's care.    Severity of Illness: The appropriate patient status for this patient is INPATIENT. Inpatient status is judged to be reasonable and necessary in order to provide the required intensity of service to ensure the patient's safety. The patient's presenting symptoms, physical exam findings, and initial radiographic and laboratory data in the context of their chronic comorbidities is felt to place them at high risk for further clinical deterioration. Furthermore, it is not anticipated that the patient will be medically stable for discharge from the hospital within 2 midnights of admission.   * I certify that at the point of admission it  is my clinical judgment that the patient will require inpatient hospital care spanning beyond 2 midnights from the point of admission due to high intensity of service, high risk for further deterioration and high frequency of surveillance required.*  Author: Clydie Braun, MD 03/22/2023 9:00 AM  For on call review [email or web address].

## 2023-03-22 NOTE — Consult Note (Signed)
Consult Note  BLAYZE KOBER 11-28-1945  [id number].    Requesting MD: Madelyn Flavors, MD Chief Complaint/Reason for Consult: cholelithiasis with abdominal pain  HPI:  Patient is a 77 year old female who presented to [facility] with epigastric abdominal pain and nausea that started last night after having an impossible burger for dinner. Pain was sharp and cramping and associated with nausea, bloating and constipation. She has not vomited. Pain is a little better currently but she is still having nausea. Denies fever, chills, SOB, urinary symptoms. Prior abdominal surgery includes open tubal ligation in the 1970s. She is on Eliquis for PAF and Factor V Leiden and her last dose of this was 11/23 at 8 PM. PMH otherwise significant for pulmonary HTN, ILR in place, HTN, HLD, CKD stage IV, Hx of CVA. Her husband is at bedside.   ROS: Negative other than HPI  Family History  Problem Relation Age of Onset   Diabetes Mother        died at 57   Heart attack Mother    Heart attack Father        died at 45    Past Medical History:  Diagnosis Date   CKD (chronic kidney disease), stage IV (HCC)    Heterozygous factor V Leiden mutation (HCC)    Hyperlipidemia    Hypertension    Motion sickness    PAF (paroxysmal atrial fibrillation) (HCC)    Prothrombin gene mutation (HCC)    Pulmonary hypertension (HCC)    a. 2D echo 11/01/17 showed EF 65-70%, grade 1 DD, mod-severe TR, moderately increased PASP . b. F/u Echo 07/2018 not seen.   PVC's (premature ventricular contractions)    occasional PVCs   Sinus bradycardia    a. requiring reduction in beta blocker in 2020.   Stroke (cerebrum) (HCC)    SVT (supraventricular tachycardia) (HCC)    Tricuspid regurgitation     Past Surgical History:  Procedure Laterality Date   HYSTEROSCOPY WITH D & C  11/07/2011   Procedure: DILATATION AND CURETTAGE /HYSTEROSCOPY;  Surgeon: Antionette Char, MD;  Location: [facility];  Service: Gynecology;   Laterality: N/A;  endometrial polypectomy   LOOP RECORDER INSERTION N/A 07/30/2018   Procedure: LOOP RECORDER INSERTION;  Surgeon: Regan Lemming, MD;  Location: [facility];  Service: Cardiovascular;  Laterality: N/A;   TUBAL LIGATION  1977    Social History:  reports that she has never smoked. She has never used smokeless tobacco. She reports that she does not drink alcohol and does not use drugs.  Allergies:  Allergies  Allergen Reactions   Cefdinir     Other reaction(s): vomiting   Egg-Derived Products Anaphylaxis and Swelling   Orange Fruit [Citrus] Anaphylaxis and Swelling   Prednisone Anaphylaxis, Hives and Itching   Sulfa Antibiotics Anaphylaxis, Hives and Itching   Tomato Anaphylaxis and Swelling   Atorvastatin     Muscle cramps    Influenza Vaccines Other (See Comments)    unknown   Aspartame And Phenylalanine Palpitations   Caffeine Palpitations   Shellfish Allergy Itching, Nausea And Vomiting and Rash    Medications Prior to Admission  Medication Sig Dispense Refill   atenolol (TENORMIN) 50 MG tablet TAKE 1 TABLET(50 MG) BY MOUTH DAILY 90 tablet 3   Calcium Carbonate-Vitamin D (CALCIUM-VITAMIN D) 600-3.125 MG-MCG TABS Take 1 tablet by mouth daily.     carboxymethylcellulose (REFRESH PLUS) 0.5 % SOLN Place 1 drop into both eyes daily as needed (for  dry eyes).     Chlorphen-PE-Acetaminophen (CORICIDIN D COLD/FLU/SINUS PO) Take 2 tablets by mouth as needed (Allergies).     ELIQUIS 5 MG TABS tablet TAKE 1 TABLET(5 MG) BY MOUTH TWICE DAILY 60 tablet 5   famotidine (PEPCID) 20 MG tablet Take 20 mg by mouth daily.     fexofenadine-pseudoephedrine (ALLEGRA-D 24) 180-240 MG 24 hr tablet Take 1 tablet by mouth daily as needed (allergies).     meclizine (ANTIVERT) 12.5 MG tablet Take 25 mg by mouth 3 (three) times daily as needed for dizziness.     triamterene-hydrochlorothiazide (DYAZIDE) 37.5-25 MG capsule Take 1 capsule by mouth daily.      Blood pressure (!)  127/51, pulse (!) 45, temperature 98.9 F (37.2 C), temperature source Oral, resp. rate 17, height 5' 1.5" (1.562 m), weight 75.3 kg, SpO2 99%. Physical Exam:  General: pleasant, WD, overweight female who is laying in bed in NAD HEENT: head is normocephalic, atraumatic.  Sclera are anicteric.  Ears and nose without any masses or lesions.  Mouth is pink and moist Heart: regular, rate, and rhythm. Palpable radial and pedal pulses bilaterally Lungs: CTAB, no wheezes, rhonchi, or rales noted.  Respiratory effort nonlabored Abd: soft, mild ttp in epigastric abdomen, negative Murphy sign, mild distention, +BS, lower midline surgical scar MS: all 4 extremities are symmetrical with no cyanosis, clubbing, or edema. Skin: warm and dry with no masses, lesions, or rashes Neuro: Cranial nerves 2-12 grossly intact, sensation is normal throughout Psych: A&Ox3 with an appropriate affect.   Results for orders placed or performed during the hospital encounter of 03/22/23 (from the past 48 hour(s))  CBC with Differential     Status: Abnormal   Collection Time: 03/22/23  2:18 AM  Result Value Ref Range   WBC 6.6 4.0 - 10.5 K/uL   RBC 3.97 3.87 - 5.11 MIL/uL   Hemoglobin 11.3 (L) 12.0 - 15.0 g/dL   HCT 16.1 (L) 09.6 - 04.5 %   MCV 85.6 80.0 - 100.0 fL   MCH 28.5 26.0 - 34.0 pg   MCHC 33.2 30.0 - 36.0 g/dL   RDW 40.9 81.1 - 91.4 %   Platelets 237 150 - 400 K/uL   nRBC 0.0 0.0 - 0.2 %   Neutrophils Relative % 62 %   Neutro Abs 4.2 1.7 - 7.7 K/uL   Lymphocytes Relative 27 %   Lymphs Abs 1.8 0.7 - 4.0 K/uL   Monocytes Relative 8 %   Monocytes Absolute 0.5 0.1 - 1.0 K/uL   Eosinophils Relative 2 %   Eosinophils Absolute 0.1 0.0 - 0.5 K/uL   Basophils Relative 1 %   Basophils Absolute 0.0 0.0 - 0.1 K/uL   Immature Granulocytes 0 %   Abs Immature Granulocytes 0.01 0.00 - 0.07 K/uL    Comment: Performed at [facility], [address] [place], [place] [address]  Comprehensive  metabolic panel     Status: Abnormal   Collection Time: 03/22/23  2:18 AM  Result Value Ref Range   Sodium 131 (L) 135 - 145 mmol/L   Potassium 3.8 3.5 - 5.1 mmol/L   Chloride 95 (L) 98 - 111 mmol/L   CO2 23 22 - 32 mmol/L   Glucose, Bld 97 70 - 99 mg/dL    Comment: Glucose reference range applies only to samples taken after fasting for at least 8 hours.   BUN 41 (H) 8 - 23 mg/dL   Creatinine, Ser 6.21 (H) 0.44 - 1.00 mg/dL  Calcium 9.4 8.9 - 10.3 mg/dL   Total Protein 7.8 6.5 - 8.1 g/dL   Albumin 4.3 3.5 - 5.0 g/dL   AST 24 15 - 41 U/L   ALT 6 0 - 44 U/L   Alkaline Phosphatase 64 38 - 126 U/L   Total Bilirubin 0.4 <1.2 mg/dL   GFR, Estimated 25 (L) >60 mL/min    Comment: (NOTE) Calculated using the CKD-EPI Creatinine Equation (2021)    Anion gap 13 5 - 15    Comment: Performed at [facility], [address] [place], [place] [address]  Lipase, blood     Status: Abnormal   Collection Time: 03/22/23  2:18 AM  Result Value Ref Range   Lipase 162 (H) 11 - 51 U/L    Comment: Performed at [facility], [address] [place], [place] [address]  Troponin I (High Sensitivity)     Status: None   Collection Time: 03/22/23  2:18 AM  Result Value Ref Range   Troponin I (High Sensitivity) 6 <18 ng/L    Comment: (NOTE) Elevated high sensitivity troponin I (hsTnI) values and significant  changes across serial measurements may suggest ACS but many other  chronic and acute conditions are known to elevate hsTnI results.  Refer to the "Links" section for chest pain algorithms and additional  guidance. Performed at [facility], [address], [place], [place] [address]   Urinalysis, Routine w reflex microscopic -Urine, Clean Catch     Status: Abnormal   Collection Time: 03/22/23  3:53 AM  Result Value Ref Range   Color, Urine COLORLESS (A) YELLOW   APPearance CLEAR CLEAR   Specific Gravity, Urine 1.007 1.005 - 1.030   pH  6.0 5.0 - 8.0   Glucose, UA NEGATIVE NEGATIVE mg/dL   Hgb urine dipstick NEGATIVE NEGATIVE   Bilirubin Urine NEGATIVE NEGATIVE   Ketones, ur NEGATIVE NEGATIVE mg/dL   Protein, ur NEGATIVE NEGATIVE mg/dL   Nitrite NEGATIVE NEGATIVE   Leukocytes,Ua NEGATIVE NEGATIVE    Comment: Performed at [facility], [address], [place], [place] [address]  Troponin I (High Sensitivity)     Status: None   Collection Time: 03/22/23  3:53 AM  Result Value Ref Range   Troponin I (High Sensitivity) 7 <18 ng/L    Comment: (NOTE) Elevated high sensitivity troponin I (hsTnI) values and significant  changes across serial measurements may suggest ACS but many other  chronic and acute conditions are known to elevate hsTnI results.  Refer to the "Links" section for chest pain algorithms and additional  guidance. Performed at [facility], [address], [place], [place] [address]    CT ABDOMEN PELVIS WO CONTRAST  Result Date: 03/22/2023 CLINICAL DATA:  77 year old female with epigastric pain since 1630 hours. Abdominal swelling and nausea. History of large liver cyst. EXAM: CT ABDOMEN AND PELVIS WITHOUT CONTRAST TECHNIQUE: Multidetector CT imaging of the abdomen and pelvis was performed following the standard protocol without IV contrast. RADIATION DOSE REDUCTION: This exam was performed according to the departmental dose-optimization program which includes automated exposure control, adjustment of the mA and/or kV according to patient size and/or use of iterative reconstruction technique. COMPARISON:  Noncontrast CT Abdomen and Pelvis 12/12/2021. FINDINGS: Lower chest: Mild chronic left lower lobe linear atelectasis or scarring. No cardiomegaly, pericardial or pleural effusion. Hepatobiliary: Cholelithiasis with individual gallstones up to 11 mm. Gallbladder is larger, and there is evidence of abnormal gallbladder wall thickening or inflammation on coronal image 70. No  bile duct dilatation. Chronic but enlarging hepatic cyst 1st visible in 2018 again has a bilobed appearance, arising to the right of the falciform ligament. Currently the cyst is up to 70 mm long axis and estimated cyst volume is 120 mL (versus 140 mL last year. Mild distortion on the hepatic contour appears stable. No perihepatic fluid. And otherwise negative noncontrast liver parenchyma. Pancreas: Negative noncontrast appearance. Spleen: Negative. Adrenals/Urinary Tract: Negative adrenal glands and left kidney. Right renal posterior midpole simple fluid density cyst (no follow-up imaging recommended). No nephrolithiasis or hydronephrosis. Decompressed ureters and bladder. Stable pelvic phleboliths. Stomach/Bowel: Increased stool ball in the rectum (sagittal image 83). Upstream sigmoid colon retained stool and mild redundancy. Descending colon is decompressed. Ordinary transverse and right colon retained gas and stool. Normal appendix on series 2, image 49. Decompressed terminal ileum. No dilated small bowel. Noncontrast stomach and duodenum appear negative. Vascular/Lymphatic: Extensive Aortoiliac calcified atherosclerosis. Normal caliber abdominal aorta. Vascular patency is not evaluated in the absence of IV contrast. No lymphadenopathy identified. Reproductive: Stable and negative noncontrast appearance. Other: No pelvis free fluid. Musculoskeletal: Lower thoracic interbody ankylosis from flowing endplate osteophytes. Degenerative interbody ankylosis in the lower lumbar spine at L4-L5. Additional lumbar degeneration. No acute osseous abnormality identified. IMPRESSION: 1. Chronic cholelithiasis with increased gallbladder distension since last year and questionable early gallbladder inflammation by CT. Consider Acute Cholecystitis and right upper quadrant ultrasound would be valuable. 2. No other acute finding in the noncontrast Abdomen. 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 No complicating features are evident. 3. Stool ball in the rectum, cannot exclude fecal impaction. But no bowel obstruction. Normal appendix. 4.  Aortic Atherosclerosis (ICD10-I70.0). Electronically Signed   By: Odessa Fleming M.D.   On: 03/22/2023 04:52      Assessment/Plan Cholelithiasis  Abdominal pain and nausea - CT with above and questionable early gallbladder inflammation, chronic hepatic cyst that is stable, fecal impaction  - LFTs normal, lipase mildly elevated at 162 - no leukocytosis and negative murphy sign but does have some epigastric ttp on exam  - no indication for emergent surgical intervention and no current indication for emergent percutaneous cholecystostomy, continue abx - hold eliquis, ok to have heparin gtt  - ok to have CLD today from surgery standpoint, NPO after MN - will get RUQ [place] to look more specifically at the gallbladder, may need HIDA tomorrow AM  - repeat labs tomorrow AM and check TG at that time as well, also ordered lipid panel since she was scheduled to have that drawn as an outpatient and will be fasting  - recommend cardiology consult for perioperative risk stratification - likely needs updated ECHO Constipation - miralax, suppository prn   FEN: CLD, IVF per [facility] - NPO after MN VTE: ok to have hep gtt, hold eliquis ID: [id number] 11/24>>  - per [facility] -  Factor V Leiden on Eliquis PAF ILR implant  Pulmonary HTN Hx of CVA CKD stage IV HTN HLD  I reviewed ED provider notes, last 24 h vitals and pain scores, last 48 h intake and output, last 24 h labs and trends, and last 24 h imaging results.   Juliet Rude, [facility] Surgery 03/22/2023, 10:03 AM Please see Amion for pager number during day hours 7:00am-4:30pm

## 2023-03-22 NOTE — Plan of Care (Signed)
Plan of Care Note for accepted transfer   Patient name: Jasmine Dawson HKV:[id number] DOB: [birth year]  Facility requesting transfer: [facility] Requesting Provider: Dr. Wallace Cullens Facility course: 77 year old female with history of CKD stage IV, heterozygous factor V Leiden mutation, prothrombin gene mutation, hyperlipidemia, hypertension, paroxysmal A-fib on Eliquis, pulmonary hypertension, PVCs, sinus bradycardia, stroke, SVT, tricuspid regurgitation presented to the ED with complaints of epigastric abdominal pain and nausea since after eating a veggie burger for dinner.  Afebrile, slightly bradycardic.  Labs showing no leukocytosis, sodium 131, creatinine 1.9 (stable), lipase 162, LFTs normal, troponin negative x 2, UA not suggestive of infection.   CT abdomen pelvis without contrast showing: IMPRESSION: 1. Chronic cholelithiasis with increased gallbladder distension since last year and questionable early gallbladder inflammation by CT. Consider Acute Cholecystitis and right upper quadrant ultrasound would be valuable.   2. No other acute finding in the noncontrast Abdomen. 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 No complicating features are evident.   3. Stool ball in the rectum, cannot exclude fecal impaction. But no bowel obstruction. Normal appendix.   4.  Aortic Atherosclerosis (ICD10-I70.0).  Patient was given Maalox, morphine, Zofran, Zosyn, and 500 mL normal saline.  Given findings concerning for acute cholecystitis on CT, EDP spoke to Dr. Bedelia Person with general surgery who recommended holding Eliquis and admitting the patient to [facility].  General surgery will consult.   Plan of care: The patient is accepted for admission to Telemetry unit at [facility].  [facility] will assume care on arrival to accepting facility. Until arrival, care as per EDP. However, [facility] available 24/7 for questions and assistance.  Check  [email or web address] for on-call coverage.  Nursing staff, please call [facility] Admits & Consults System-Wide number under Amion on patient's arrival so appropriate admitting provider can evaluate the pt.

## 2023-03-22 NOTE — ED Notes (Signed)
-  Called [facility] for transportation at 619am.

## 2023-03-22 NOTE — ED Notes (Addendum)
Report given to [facility] RN at [facility]

## 2023-03-23 ENCOUNTER — Ambulatory Visit: Payer: Medicare Other

## 2023-03-23 DIAGNOSIS — Z01810 Encounter for preprocedural cardiovascular examination: Secondary | ICD-10-CM | POA: Diagnosis not present

## 2023-03-23 DIAGNOSIS — I480 Paroxysmal atrial fibrillation: Secondary | ICD-10-CM | POA: Diagnosis not present

## 2023-03-23 DIAGNOSIS — R1013 Epigastric pain: Secondary | ICD-10-CM | POA: Diagnosis not present

## 2023-03-23 HISTORY — DX: Encounter for preprocedural cardiovascular examination: Z01.810

## 2023-03-23 LAB — CBC
HCT: 31.7 % — ABNORMAL LOW (ref 36.0–46.0)
Hemoglobin: 10.4 g/dL — ABNORMAL LOW (ref 12.0–15.0)
MCH: 28.2 pg (ref 26.0–34.0)
MCHC: 32.8 g/dL (ref 30.0–36.0)
MCV: 85.9 fL (ref 80.0–100.0)
Platelets: 223 10*3/uL (ref 150–400)
RBC: 3.69 MIL/uL — ABNORMAL LOW (ref 3.87–5.11)
RDW: 14 % (ref 11.5–15.5)
WBC: 5.6 10*3/uL (ref 4.0–10.5)
nRBC: 0 % (ref 0.0–0.2)

## 2023-03-23 LAB — LIPID PANEL
Cholesterol: 190 mg/dL (ref 0–200)
HDL: 67 mg/dL (ref >40)
LDL Cholesterol: 112 mg/dL — ABNORMAL HIGH (ref 0–99)
Total CHOL/HDL Ratio: 2.8 {ratio}
Triglycerides: 56 mg/dL (ref <150)
VLDL: 11 mg/dL (ref 0–40)

## 2023-03-23 LAB — COMPREHENSIVE METABOLIC PANEL
ALT: 9 U/L (ref 0–44)
AST: 23 U/L (ref 15–41)
Albumin: 3.4 g/dL — ABNORMAL LOW (ref 3.5–5.0)
Alkaline Phosphatase: 54 U/L (ref 38–126)
Anion gap: 7 (ref 5–15)
BUN: 31 mg/dL — ABNORMAL HIGH (ref 8–23)
CO2: 25 mmol/L (ref 22–32)
Calcium: 8.6 mg/dL — ABNORMAL LOW (ref 8.9–10.3)
Chloride: 99 mmol/L (ref 98–111)
Creatinine, Ser: 2.38 mg/dL — ABNORMAL HIGH (ref 0.44–1.00)
GFR, Estimated: 20 mL/min — ABNORMAL LOW (ref >60)
Glucose, Bld: 89 mg/dL (ref 70–99)
Potassium: 3.4 mmol/L — ABNORMAL LOW (ref 3.5–5.1)
Sodium: 131 mmol/L — ABNORMAL LOW (ref 135–145)
Total Bilirubin: 0.6 mg/dL (ref <1.2)
Total Protein: 6.6 g/dL (ref 6.5–8.1)

## 2023-03-23 LAB — HEPARIN LEVEL (UNFRACTIONATED): Heparin Unfractionated: >1.1 [IU]/mL — ABNORMAL HIGH (ref 0.30–0.70)

## 2023-03-23 LAB — APTT
aPTT: 116 s — ABNORMAL HIGH (ref 24–36)
aPTT: 78 s — ABNORMAL HIGH (ref 24–36)
aPTT: 87 s — ABNORMAL HIGH (ref 24–36)

## 2023-03-23 LAB — TRIGLYCERIDES: Triglycerides: 59 mg/dL (ref <150)

## 2023-03-23 MED ORDER — POTASSIUM CHLORIDE 10 MEQ/100ML IV SOLN
10.0000 meq | INTRAVENOUS | Status: AC
  Administered 2023-03-23: 10 meq via INTRAVENOUS
  Filled 2023-03-23: qty 100

## 2023-03-23 MED ORDER — PIPERACILLIN-TAZOBACTAM IN DEX 2-0.25 GM/50ML IV SOLN
2.2500 g | Freq: Four times a day (QID) | INTRAVENOUS | Status: DC
  Administered 2023-03-23 – 2023-03-24 (×3): 2.25 g via INTRAVENOUS
  Filled 2023-03-23 (×5): qty 50

## 2023-03-23 NOTE — Progress Notes (Signed)
PROGRESS NOTE    MEGANN BARTUNEK  ZOX:[id number] DOB: Dec 21, 1945 DOA: 03/22/2023 PCP: Leilani Able, MD   Brief Narrative:  This 77 yrs old female with PMH significant of CKD stage IV, heterozygous factor V Leiden mutation, prothrombin gene mutation, hyperlipidemia, hypertension, paroxysmal A-fib on Eliquis, pulmonary hypertension, sinus bradycardia, Hx. of stroke, DVT, PE, SVT, and tricuspid regurgitation who presented with complaints of mid abdominal pain.  Her abdominal pain started after 1 hour after consuming an impossible burger for the first time.  Patient reports having intermittent constipation. In the ED patient was found to be bradycardic, lipase 162, LFTs WNL. CTA/P showed chronic cholelithiasis with increased gallbladder wall distention. Patient was admitted for further evaluation.  General surgery is consulted, Eliquis kept on hold started on IV Zosyn.  Assessment & Plan:   Principal Problem:   Epigastric pain Active Problems:   Cholelithiasis   Acute cholecystitis   Paroxysmal atrial fibrillation (HCC)   Clotting disorder (HCC)   Chronic anticoagulation   CKD (chronic kidney disease), stage IV (HCC)   Constipation  Cholelithiasis with possible cholecystitis: Patient presented with epigastric abdominal pain, about an hour after eating Impossible burger for the first time. Lipase 196, LFTs WNL, CT A/P: Chronic cholelithiasis with increased gallbladder wall thickening, concern for cholecystitis Continue IV Zosyn for abdominal infection. Continue IV Zofran for as needed for nausea and vomiting Adequate pain control with pain meds. Started on clear liquid diet, Follow-up ultrasound, may require HIDA scan if inconclusive General surgery consulted.  Eliquis on hold. Cardiology consulted for preoperative clearance.  History of DVT: Paroxysmal atrial fibrillation on anticoagulation: Patient has history of clotting disorder and is on Eliquis for paroxysmal atrial  fibrillation.   Appears to be in sinus bradycardia at this time with stable blood pressure.   Eliquis is on hold.  Continue IV heparin for now. HR controlled. Hold atenolol due to bradycardia.  CKD stage IV: Patient has history of stage 4 chronic kidney disease.  Her baseline creatinine is between 2 and 2.3. She follows with [name] at [facility]. Continue fluid management.  Hyperlipidemia: Continue Crestor 10 mg daily.  Hypertension: Hold atenolol as blood pressure is better and heart rate is low.   Constipation: Continue Senokot and Colace.   DVT prophylaxis: Heparin IV Code Status: Full code Family Communication: No family at bed side. Disposition Plan:    Status is: Inpatient Remains inpatient appropriate because: Admitted for epigastric abdominal pain found to have cholelithiasis with suspected acute cholecystitis.  General surgery is consulted.    Consultants:  General surgery Cardiology  Procedures: Antimicrobials:  Anti-infectives (From admission, onward)    Start     Dose/Rate Route Frequency Ordered Stop   03/22/23 1400  piperacillin-tazobactam (ZOSYN) IVPB 3.375 g  Status:  Discontinued        3.375 g 100 mL/hr over 30 Minutes Intravenous Every 8 hours 03/22/23 0911 03/22/23 0932   03/22/23 1400  piperacillin-tazobactam (ZOSYN) IVPB 3.375 g        3.375 g 12.5 mL/hr over 240 Minutes Intravenous Every 8 hours 03/22/23 0932     03/22/23 0530  piperacillin-tazobactam (ZOSYN) IVPB 3.375 g        3.375 g 100 mL/hr over 30 Minutes Intravenous  Once 03/22/23 0525 03/22/23 8119       Subjective: Patient was seen and examined at bedside.  Overnight events noted.  Patient reports doing better. She still reports having pain in the epigastric area,  denies any nausea and vomiting.  Objective: Vitals:   03/23/23 0023 03/23/23 0444 03/23/23 0715 03/23/23 0951  BP: 125/62 (!) 120/51 (!) 119/59 116/60  Pulse: (!) 51 (!) 47 (!) 50 (!) 52   Resp: 17 16 13    Temp: (!) 97.5 F (36.4 C) 97.7 F (36.5 C) 97.8 F (36.6 C) 97.8 F (36.6 C)  TempSrc: Oral Oral Oral Oral  SpO2: 100% 99% 100% 100%  Weight:  74.3 kg    Height:        Intake/Output Summary (Last 24 hours) at 03/23/2023 1110 Last data filed at 03/23/2023 1013 Gross per 24 hour  Intake 344.07 ml  Output 2100 ml  Net -1755.93 ml   Filed Weights   03/22/23 0131 03/22/23 0842 03/23/23 0444  Weight: 75.4 kg 75.3 kg 74.3 kg    Examination:  General exam: Appears calm and comfortable, not in any acute distress. Respiratory system: Clear to auscultation. Respiratory effort normal.  RR 15 Cardiovascular system: S1 & S2 heard, RRR. No JVD, murmurs, rubs, gallops or clicks. No pedal edema. Gastrointestinal system: Abdomen is soft, mildly tender, non distended, bowel sounds present Central nervous system: Alert and oriented x 3. No focal neurological deficits. Extremities: Symmetric 5 x 5 power. Skin: No rashes, lesions or ulcers Psychiatry: Judgement and insight appear normal. Mood & affect appropriate.     Data Reviewed: I have personally reviewed following labs and imaging studies  CBC: Recent Labs  Lab 03/22/23 0218 03/23/23 0305  WBC 6.6 5.6  NEUTROABS 4.2  --   HGB 11.3* 10.4*  HCT 34.0* 31.7*  MCV 85.6 85.9  PLT 237 223   Basic Metabolic Panel: Recent Labs  Lab 03/22/23 0218 03/23/23 0305  NA 131* 131*  K 3.8 3.4*  CL 95* 99  CO2 23 25  GLUCOSE 97 89  BUN 41* 31*  CREATININE 1.97* 2.38*  CALCIUM 9.4 8.6*   GFR: Estimated Creatinine Clearance: 17.9 mL/min (A) (by C-G formula based on SCr of 2.38 mg/dL (H)). Liver Function Tests: Recent Labs  Lab 03/22/23 0218 03/23/23 0305  AST 24 23  ALT 6 9  ALKPHOS 64 54  BILITOT 0.4 0.6  PROT 7.8 6.6  ALBUMIN 4.3 3.4*   Recent Labs  Lab 03/22/23 0218  LIPASE 162*   No results for input(s): "AMMONIA" in the last 168 hours. Coagulation Profile: No results for input(s): "INR",  "PROTIME" in the last 168 hours. Cardiac Enzymes: No results for input(s): "CKTOTAL", "CKMB", "CKMBINDEX", "TROPONINI" in the last 168 hours. BNP (last 3 results) No results for input(s): "PROBNP" in the last 8760 hours. HbA1C: No results for input(s): "HGBA1C" in the last 72 hours. CBG: No results for input(s): "GLUCAP" in the last 168 hours. Lipid Profile: Recent Labs    03/23/23 0305 03/23/23 0307  CHOL  --  190  HDL  --  67  LDLCALC  --  706*  TRIG 59 56  CHOLHDL  --  2.8   Thyroid Function Tests: No results for input(s): "TSH", "T4TOTAL", "FREET4", "T3FREE", "THYROIDAB" in the last 72 hours. Anemia Panel: No results for input(s): "VITAMINB12", "FOLATE", "FERRITIN", "TIBC", "IRON", "RETICCTPCT" in the last 72 hours. Sepsis Labs: No results for input(s): "PROCALCITON", "LATICACIDVEN" in the last 168 hours.  No results found for this or any previous visit (from the past 240 hour(s)).   Radiology Studies: CT ABDOMEN PELVIS WO CONTRAST  Result Date: 03/22/2023 CLINICAL DATA:  77 year old female with epigastric pain since 1630 hours. Abdominal swelling and nausea. History of large liver cyst. EXAM: CT  ABDOMEN AND PELVIS WITHOUT CONTRAST TECHNIQUE: Multidetector CT imaging of the abdomen and pelvis was performed following the standard protocol without IV contrast. RADIATION DOSE REDUCTION: This exam was performed according to the departmental dose-optimization program which includes automated exposure control, adjustment of the mA and/or kV according to patient size and/or use of iterative reconstruction technique. COMPARISON:  Noncontrast CT Abdomen and Pelvis 12/12/2021. FINDINGS: Lower chest: Mild chronic left lower lobe linear atelectasis or scarring. No cardiomegaly, pericardial or pleural effusion. Hepatobiliary: Cholelithiasis with individual gallstones up to 11 mm. Gallbladder is larger, and there is evidence of abnormal gallbladder wall thickening or inflammation on coronal  image 70. No bile duct dilatation. Chronic but enlarging hepatic cyst 1st visible in 2018 again has a bilobed appearance, arising to the right of the falciform ligament. Currently the cyst is up to 70 mm long axis and estimated cyst volume is 120 mL (versus 140 mL last year. Mild distortion on the hepatic contour appears stable. No perihepatic fluid. And otherwise negative noncontrast liver parenchyma. Pancreas: Negative noncontrast appearance. Spleen: Negative. Adrenals/Urinary Tract: Negative adrenal glands and left kidney. Right renal posterior midpole simple fluid density cyst (no follow-up imaging recommended). No nephrolithiasis or hydronephrosis. Decompressed ureters and bladder. Stable pelvic phleboliths. Stomach/Bowel: Increased stool ball in the rectum (sagittal image 83). Upstream sigmoid colon retained stool and mild redundancy. Descending colon is decompressed. Ordinary transverse and right colon retained gas and stool. Normal appendix on series 2, image 49. Decompressed terminal ileum. No dilated small bowel. Noncontrast stomach and duodenum appear negative. Vascular/Lymphatic: Extensive Aortoiliac calcified atherosclerosis. Normal caliber abdominal aorta. Vascular patency is not evaluated in the absence of IV contrast. No lymphadenopathy identified. Reproductive: Stable and negative noncontrast appearance. Other: No pelvis free fluid. Musculoskeletal: Lower thoracic interbody ankylosis from flowing endplate osteophytes. Degenerative interbody ankylosis in the lower lumbar spine at L4-L5. Additional lumbar degeneration. No acute osseous abnormality identified. IMPRESSION: 1. Chronic cholelithiasis with increased gallbladder distension since last year and questionable early gallbladder inflammation by CT. Consider Acute Cholecystitis and right upper quadrant ultrasound would be valuable. 2. No other acute finding in the noncontrast Abdomen. A chronic central hepatic cystic lesion (7 cm diameter) is  stable to slightly smaller since last year. No complicating features are evident. 3. Stool ball in the rectum, cannot exclude fecal impaction. But no bowel obstruction. Normal appendix. 4.  Aortic Atherosclerosis (ICD10-I70.0). Electronically Signed   By: Odessa Fleming M.D.   On: 03/22/2023 04:52    Scheduled Meds:  polyethylene glycol  17 g Oral Daily   sodium chloride flush  3 mL Intravenous Q12H   Continuous Infusions:  heparin 700 Units/hr (03/23/23 0441)   piperacillin-tazobactam (ZOSYN)  IV 3.375 g (03/23/23 0515)   potassium chloride Stopped (03/23/23 1046)     LOS: 1 day    Time spent: 50 mins    Willeen Niece, MD [facility]   If 7PM-7AM, please contact night-coverage

## 2023-03-23 NOTE — Progress Notes (Signed)
PHARMACY - ANTICOAGULATION CONSULT NOTE  Pharmacy Consult for IV Heparin  Indication: atrial fibrillation  Patient Measurements: Height: 5' 1.5" (156.2 cm) Weight: 74.3 kg (163 lb 14.4 oz) IBW/kg (Calculated) : 48.95 Heparin Dosing Weight: 65 kg  Vital Signs: Temp: 97.6 F (36.4 C) (11/25 1150) Temp Source: Oral (11/25 1150) BP: 111/80 (11/25 1150) Pulse Rate: 48 (11/25 1150)  Labs: Recent Labs    03/22/23 0218 03/22/23 0353 03/22/23 1957 03/23/23 0305 03/23/23 1255  HGB 11.3*  --   --  10.4*  --   HCT 34.0*  --   --  31.7*  --   PLT 237  --   --  223  --   APTT  --   --  105* 116* 78*  HEPARINUNFRC  --   --  >1.10* >1.10*  --   CREATININE 1.97*  --   --  2.38*  --   TROPONINIHS 6 7  --   --   --     Estimated Creatinine Clearance: 17.9 mL/min (A) (by C-G formula based on SCr of 2.38 mg/dL (H)).  Assessment: 77 yo female admitted for cholelithiasis with abdominal pain. PMH of Factor V Leiden and pAF on eliquis. Last dose 11/23 at 8pm. Will defer bolus. Pharmacy consulted for heparin dosing.   CBC shows Hgb decrease from 11.3 > 10.4 and plts 237 > 223, sCr bumped up from 1.97 > 2.38. aPTT now within goal range at 78, on heparin infusion at 700 units/hr. No s/sx of bleeding or infusion issues. Confirmed with RN, drawn appropriately.   Goal of Therapy:  Heparin level 0.3-0.7 units/ml aPTT 66-102 seconds Monitor platelets by anticoagulation protocol: Yes   Plan:   Continue heparin infusion at 700 units/hr Check aPTT in 8 hours  Continue to monitor heparin levels and aPTT daily until levels correlate, switch to heparin levels Continue to monitor H&H and platelets  Thank you for allowing pharmacy to participate in this patient's care,  Sherron Monday, PharmD, BCCCP Clinical Pharmacist  Phone: [phone] 03/23/2023 1:37 PM  Please check AMION for all [facility] Pharmacy phone numbers After 10:00 PM, call Main Pharmacy [phone]

## 2023-03-23 NOTE — TOC Initial Note (Signed)
Transition of Care [facility]) - Initial/Assessment Note    Patient Details  Name: Jasmine Dawson MRN: [id number] Date of Birth: [birth year]  Transition of [facility]) CM/SW Contact:    Leone Haven, RN Phone Number: 03/23/2023, 6:11 PM  Clinical Narrative:                 From home with spouse, has PCP and insurance on file, states has no HH services in place at this time or DME at home.  States spouse will transport her home at [employer] and family is support system, states gets medications from [facility] on [address] and [address]  Pta self ambulatory.   Expected Discharge Plan: Home/Self Care Barriers to Discharge: Continued Medical Work up   Patient Goals and CMS Choice Patient states their goals for this hospitalization and ongoing recovery are:: return home   Choice offered to / list presented to : NA      Expected Discharge Plan and Services In-house Referral: NA Discharge Planning Services: CM Consult Post Acute Care Choice: NA Living arrangements for the past 2 months: Single Family Home                 DME Arranged: N/A DME Agency: NA       HH Arranged: NA          Prior Living Arrangements/Services Living arrangements for the past 2 months: Single Family Home Lives with:: Spouse Patient language and need for interpreter reviewed:: Yes Do you feel safe going back to the place where you live?: Yes      Need for Family Participation in Patient Care: Yes (Comment) Care giver support system in place?: Yes (comment)   Criminal Activity/Legal Involvement Pertinent to Current Situation/Hospitalization: No - Comment as needed  Activities of Daily Living   ADL Screening (condition at time of admission) Independently performs ADLs?: Yes (appropriate for developmental age) Is the patient deaf or have difficulty hearing?: No Does the patient have difficulty seeing, even when wearing glasses/contacts?: No Does the patient have difficulty concentrating, remembering,  or making decisions?: No  Permission Sought/Granted Permission sought to share information with : Case Manager Permission granted to share information with : Yes, Verbal Permission Granted              Emotional Assessment Appearance:: Appears stated age Attitude/Demeanor/Rapport: Engaged Affect (typically observed): Appropriate Orientation: : Oriented to Self, Oriented to Situation, Oriented to  Time, Oriented to Place Alcohol / Substance Use: Not Applicable Psych Involvement: No (comment)  Admission diagnosis:  Acute cholecystitis [K81.0] Cholecystitis [K81.9] Acute biliary pancreatitis without infection or necrosis [K85.10] Patient Active Problem List   Diagnosis Date Noted   Preop cardiovascular exam 03/23/2023   Acute cholecystitis 03/22/2023   Epigastric pain 03/22/2023   Clotting disorder (HCC) 03/22/2023   Chronic anticoagulation 03/22/2023   CKD (chronic kidney disease), stage IV (HCC) 03/22/2023   Constipation 03/22/2023   Paroxysmal atrial fibrillation (HCC) 08/24/2019   Secondary hypercoagulable state (HCC) 08/24/2019   Hypertensive urgency    Right arm weakness    Cerebral embolism with cerebral infarction 07/29/2018   Weakness 07/28/2018   Chest pain 10/31/2017   Palpitations 10/31/2017   Right leg pain 10/31/2017   Elevated troponin 10/31/2017   SVT (supraventricular tachycardia) (HCC) 10/03/2016   Chronic kidney disease, stage 3b (HCC) 10/03/2016   Endometrial polyp 11/07/2011   Dizzy spells 09/11/2011   Essential hypertension 09/11/2011   Abdominal pain 09/11/2011   Cholelithiasis 09/11/2011   PCP:  Leilani Able,  MD Pharmacy:   [facility] [address] - [place], [place] - [address] [facility] [place] [address] Phone: [phone] Fax: [phone]  [id number] [place], [place] - [address] AT [facility] [address] [facility] [address] [place] [address] Phone: [phone] Fax:  [phone]     Social Determinants of Health (SDOH) Social History: SDOH Screenings   Food Insecurity: No Food Insecurity (03/22/2023)  Housing: Medium Risk (03/22/2023)  Transportation Needs: No Transportation Needs (03/22/2023)  Utilities: Not At Risk (03/22/2023)  Tobacco Use: Low Risk  (03/22/2023)   SDOH Interventions:     Readmission Risk Interventions     No data to display

## 2023-03-23 NOTE — Plan of Care (Signed)
  Problem: Education: Goal: Knowledge of General Education information will improve Description Including pain rating scale, medication(s)/side effects and non-pharmacologic comfort measures Outcome: Progressing   Problem: Clinical Measurements: Goal: Ability to maintain clinical measurements within normal limits will improve Outcome: Progressing   Problem: Activity: Goal: Risk for activity intolerance will decrease Outcome: Progressing   

## 2023-03-23 NOTE — Consult Note (Addendum)
Cardiology Consultation   Patient ID: ANDJELA HJORTH MRN: [id number]; DOB: 05-17-1945  Admit date: 03/22/2023 Date of Consult: 03/23/2023  PCP:  Leilani Able, MD   [facility] Providers Cardiologist:  Lance Muss, MD  Electrophysiologist:  Will Jorja Loa, MD  {  Patient Profile:   Jasmine Dawson is a 77 y.o. female with a hx of paroxysmal A-fib/PSVT, bradycardia, prior tricuspid regurgitation and pulmonary HTN (not seen on f/u echo), factor V Leiden mutation (strong FH of clotting), prothrombin gene mutation first-degree AV block, hypertension, hyperlipidemia, CKD stage IV who is being seen 03/23/2023 for the evaluation of preoperative evaluation for cholecystectomy at the request of surgery.  History of Present Illness:   Ms. 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  She was treated with adenosine and was discharged on metoprolol.  At that time EF showed preserved however showed moderate to severe TR and elevated PASP 47.  Follow-up echocardiogram showed resolution of her TR/pulm HTN in 03/23/2023.  She had breakthrough SVT on metoprolol so this was transition to atenolol.  There was initially concern for CTEPH however her resolving TR/pulmonary hypertension and workup only notable for prothrombin gene mutation and a single gene mutation for factor V Leiden, this was excluded.  She was referred to hematology with recommendations of aggressive DVT prophylaxis for surgical procedures.  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  DVT at that time was negative.  TEE was not performed due to COVID 19.  She underwent a loop recorder that ultimately demonstrated atrial fibrillation.  Plavix was then transition to Eliquis.  She had her loop recorder extracted in early February 2024.  Prior records show very brief episodes of atrial fibrillation with the last reporting 2 minutes.  Multiple months not showing  any.  She was last seen by our office on 03/05/2023 and overall reported to be doing very well without any significant symptoms.  She did have a mechanical fall that was mild with no injurys, no syncope.  She had been walking frequently up and down the steps that day so may have been contributing to weakness.  She also had been reporting occasional muscle cramps while on rosuvastatin.  LDL was reported to be 77 and had elevated CK so statin was stopped and she was referred to the lipid clinic.  Currently patient is being evaluated for cholelithiasis with possible cholecystitis with possible plans for surgical intervention, laparoscopic versus open has not been specified.  Plan is to follow-up on ultrasound, may need HIDA scan if inconclusive.  Cardiology has been asked to preoperatively evaluate.  She has been started on IV Zosyn and reports no abdominal pain currently.  Overall patient does not present with any cardiac symptoms such as shortness of breath, orthopnea, peripheral edema, decreased exercise tolerance, chest pain, dizziness, syncope, frequent falls.  She reports no apneic spells or snoring.  No daytime sleepiness or difficulty staying awake.  She reports being very active and able to ambulate up the stairs in her house.  She frequently goes on walks and does not have any accompanying symptoms.  She has baseline bradycardia however she feels that this is stable and does not interfere with her ADLs.  Sodium 131, potassium 2.4.  Creatinine 2.38.  WBC normal.  Hemoglobin 10.4.  Past Medical History:  Diagnosis Date   CKD (chronic kidney disease), stage IV (HCC)    Heterozygous factor V Leiden mutation (HCC)  Hyperlipidemia    Hypertension    Motion sickness    PAF (paroxysmal atrial fibrillation) (HCC)    Prothrombin gene mutation (HCC)    Pulmonary hypertension (HCC)    a. 2D echo 11/01/17 showed EF 65-70%, grade 1 DD, mod-severe TR, moderately increased PASP . b. F/u Echo 07/2018 not  seen.   PVC's (premature ventricular contractions)    occasional PVCs   Sinus bradycardia    a. requiring reduction in beta blocker in 2020.   Stroke (cerebrum) (HCC)    SVT (supraventricular tachycardia) (HCC)    Tricuspid regurgitation     Past Surgical History:  Procedure Laterality Date   HYSTEROSCOPY WITH D & C  11/07/2011   Procedure: DILATATION AND CURETTAGE /HYSTEROSCOPY;  Surgeon: Antionette Char, MD;  Location: [facility];  Service: Gynecology;  Laterality: N/A;  endometrial polypectomy   LOOP RECORDER INSERTION N/A 07/30/2018   Procedure: LOOP RECORDER INSERTION;  Surgeon: Regan Lemming, MD;  Location: [facility];  Service: Cardiovascular;  Laterality: N/A;   TUBAL LIGATION  1977    Inpatient Medications: Scheduled Meds:  polyethylene glycol  17 g Oral Daily   sodium chloride flush  3 mL Intravenous Q12H   Continuous Infusions:  heparin 700 Units/hr (03/23/23 0441)   piperacillin-tazobactam (ZOSYN)  IV 3.375 g (03/23/23 0515)   potassium chloride Stopped (03/23/23 1046)   PRN Meds: acetaminophen **OR** acetaminophen, albuterol, bisacodyl, morphine injection, ondansetron **OR** ondansetron (ZOFRAN) IV, senna-docusate, traMADol  Allergies:    Allergies  Allergen Reactions   Cefdinir     Other reaction(s): vomiting   Egg-Derived Products Anaphylaxis and Swelling   Orange Fruit [Citrus] Anaphylaxis and Swelling   Prednisone Anaphylaxis, Hives and Itching   Sulfa Antibiotics Anaphylaxis, Hives and Itching   Tomato Anaphylaxis and Swelling   Atorvastatin     Muscle cramps    Influenza Vaccines Other (See Comments)    unknown   Aspartame And Phenylalanine Palpitations   Caffeine Palpitations   Shellfish Allergy Itching, Nausea And Vomiting and Rash    Social History:   Social History   Socioeconomic History   Marital status: Married    Spouse name: Not on file   Number of children: Not on file   Years of education: Not on file   Highest  education level: Not on file  Occupational History   Not on file  Tobacco Use   Smoking status: Never   Smokeless tobacco: Never  Vaping Use   Vaping status: Never Used  Substance and Sexual Activity   Alcohol use: No   Drug use: No   Sexual activity: Not Currently  Other Topics Concern   Not on file  Social History Narrative   Did daycare as a younger lady.  Retired at [employer] [phone]             Social Determinants of Health   Financial Resource Strain: Not on file  Food Insecurity: No Food Insecurity (03/22/2023)   Hunger Vital Sign    Worried About Running Out of Food in the Last Year: Never true    Ran Out of Food in the Last Year: Never true  Transportation Needs: No Transportation Needs (03/22/2023)   PRAPARE - Administrator, Civil Service (Medical): No    Lack of Transportation (Non-Medical): No  Physical Activity: Not on file  Stress: Not on file  Social Connections: Not on file  Intimate Partner Violence: Not At Risk (03/22/2023)   Humiliation, Afraid,  Rape, and Kick questionnaire    Fear of Current or Ex-Partner: No    Emotionally Abused: No    Physically Abused: No    Sexually Abused: No    Family History:   Family History  Problem Relation Age of Onset   Diabetes Mother        died at 17   Heart attack Mother    Heart attack Father        died at 52     ROS:  Please see the history of present illness.  All other ROS reviewed and negative.     Physical Exam/Data:   Vitals:   03/23/23 0023 03/23/23 0444 03/23/23 0715 03/23/23 0951  BP: 125/62 (!) 120/51 (!) 119/59 116/60  Pulse: (!) 51 (!) 47 (!) 50 (!) 52  Resp: 17 16 13    Temp: (!) 97.5 F (36.4 C) 97.7 F (36.5 C) 97.8 F (36.6 C) 97.8 F (36.6 C)  TempSrc: Oral Oral Oral Oral  SpO2: 100% 99% 100% 100%  Weight:  74.3 kg    Height:        Intake/Output Summary (Last 24 hours) at 03/23/2023 1140 Last data filed at 03/23/2023 1013 Gross per 24 hour   Intake 344.07 ml  Output 2100 ml  Net -1755.93 ml      03/23/2023    4:44 AM 03/22/2023    8:42 AM 03/22/2023    1:31 AM  Last 3 Weights  Weight (lbs) 163 lb 14.4 oz 166 lb 166 lb 4.8 oz  Weight (kg) 74.345 kg 75.297 kg 75.433 kg     Body mass index is 30.47 kg/m.  General:  Well nourished, well developed, in no acute distress HEENT: normal Neck: no JVD Vascular: No carotid bruits; Distal pulses 2+ bilaterally Cardiac:  normal S1, S2; RRR; no murmur  Lungs:  clear to auscultation bilaterally, no wheezing, rhonchi or rales  Abd: soft, nontender, no hepatomegaly  Ext: no edema Musculoskeletal:  No deformities, BUE and BLE strength normal and equal Skin: warm and dry  Neuro:  CNs 2-12 intact, no focal abnormalities noted Psych:  Normal affect   EKG:  The EKG was personally reviewed and demonstrates: Sinus bradycardia heart rate 52.  First-degree AV block, PR 234.  No acute ST-T wave changes. Telemetry:  Telemetry was personally reviewed and demonstrates: Sinus bradycardia heart rates between 45-60.  Relevant CV Studies: Echocardiogram 07/29/2018  1. The left ventricle has normal systolic function with an ejection  fraction of 60-65%. The cavity size was normal. There is moderately  increased left ventricular wall thickness. Left ventricular diastolic  Doppler parameters are consistent with impaired   relaxation. Indeterminate filling pressures The E/e' is 8-15. No evidence  of left ventricular regional wall motion abnormalities.   2. The right ventricle has normal systolic function. The cavity was  normal. There is no increase in right ventricular wall thickness.   3. The mitral valve is degenerative. Mild thickening of the mitral valve  leaflet. There is mild mitral annular calcification present.   4. The tricuspid valve is grossly normal.   5. The aortic valve is tricuspid. Mild calcification of the aortic valve.  Aortic valve regurgitation is trivial by color flow  Doppler.    Laboratory Data:  High Sensitivity Troponin:   Recent Labs  Lab 03/22/23 0218 03/22/23 0353  TROPONINIHS 6 7     Chemistry Recent Labs  Lab 03/22/23 0218 03/23/23 0305  NA 131* 131*  K 3.8 3.4*  CL 95*  99  CO2 23 25  GLUCOSE 97 89  BUN 41* 31*  CREATININE 1.97* 2.38*  CALCIUM 9.4 8.6*  GFRNONAA 25* 20*  ANIONGAP 13 7    Recent Labs  Lab 03/22/23 0218 03/23/23 0305  PROT 7.8 6.6  ALBUMIN 4.3 3.4*  AST 24 23  ALT 6 9  ALKPHOS 64 54  BILITOT 0.4 0.6   Lipids  Recent Labs  Lab 03/23/23 0307  CHOL 190  TRIG 56  HDL 67  LDLCALC 112*  CHOLHDL 2.8    Hematology Recent Labs  Lab 03/22/23 0218 03/23/23 0305  WBC 6.6 5.6  RBC 3.97 3.69*  HGB 11.3* 10.4*  HCT 34.0* 31.7*  MCV 85.6 85.9  MCH 28.5 28.2  MCHC 33.2 32.8  RDW 13.8 14.0  PLT 237 223   Thyroid No results for input(s): "TSH", "FREET4" in the last 168 hours.  BNPNo results for input(s): "BNP", "PROBNP" in the last 168 hours.  DDimer No results for input(s): "DDIMER" in the last 168 hours.   Radiology/Studies:  CT ABDOMEN PELVIS WO CONTRAST  Result Date: 03/22/2023 CLINICAL DATA:  77 year old female with epigastric pain since 1630 hours. Abdominal swelling and nausea. History of large liver cyst. EXAM: CT ABDOMEN AND PELVIS WITHOUT CONTRAST TECHNIQUE: Multidetector CT imaging of the abdomen and pelvis was performed following the standard protocol without IV contrast. RADIATION DOSE REDUCTION: This exam was performed according to the departmental dose-optimization program which includes automated exposure control, adjustment of the mA and/or kV according to patient size and/or use of iterative reconstruction technique. COMPARISON:  Noncontrast CT Abdomen and Pelvis 12/12/2021. FINDINGS: Lower chest: Mild chronic left lower lobe linear atelectasis or scarring. No cardiomegaly, pericardial or pleural effusion. Hepatobiliary: Cholelithiasis with individual gallstones up to 11 mm. Gallbladder  is larger, and there is evidence of abnormal gallbladder wall thickening or inflammation on coronal image 70. No bile duct dilatation. Chronic but enlarging hepatic cyst 1st visible in 2018 again has a bilobed appearance, arising to the right of the falciform ligament. Currently the cyst is up to 70 mm long axis and estimated cyst volume is 120 mL (versus 140 mL last year. Mild distortion on the hepatic contour appears stable. No perihepatic fluid. And otherwise negative noncontrast liver parenchyma. Pancreas: Negative noncontrast appearance. Spleen: Negative. Adrenals/Urinary Tract: Negative adrenal glands and left kidney. Right renal posterior midpole simple fluid density cyst (no follow-up imaging recommended). No nephrolithiasis or hydronephrosis. Decompressed ureters and bladder. Stable pelvic phleboliths. Stomach/Bowel: Increased stool ball in the rectum (sagittal image 83). Upstream sigmoid colon retained stool and mild redundancy. Descending colon is decompressed. Ordinary transverse and right colon retained gas and stool. Normal appendix on series 2, image 49. Decompressed terminal ileum. No dilated small bowel. Noncontrast stomach and duodenum appear negative. Vascular/Lymphatic: Extensive Aortoiliac calcified atherosclerosis. Normal caliber abdominal aorta. Vascular patency is not evaluated in the absence of IV contrast. No lymphadenopathy identified. Reproductive: Stable and negative noncontrast appearance. Other: No pelvis free fluid. Musculoskeletal: Lower thoracic interbody ankylosis from flowing endplate osteophytes. Degenerative interbody ankylosis in the lower lumbar spine at L4-L5. Additional lumbar degeneration. No acute osseous abnormality identified. IMPRESSION: 1. Chronic cholelithiasis with increased gallbladder distension since last year and questionable early gallbladder inflammation by CT. Consider Acute Cholecystitis and right upper quadrant ultrasound would be valuable. 2. No other acute  finding in the noncontrast Abdomen. 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 No complicating features are evident. 3. Stool ball in  the rectum, cannot exclude fecal impaction. But no bowel obstruction. Normal appendix. 4.  Aortic Atherosclerosis (ICD10-I70.0). Electronically Signed   By: Odessa Fleming M.D.   On: 03/22/2023 04:52     Assessment and Plan:   Preoperative evaluation for cholecystectomy Does not demonstrate any cardiac symptoms and very active and ambulates without interference of ADLs.  Can complete greater than 4 METS and walks up stairs at home without any complaints.  RCRI indicates 10.1% risk of MACE (CVA, CKD).  In 2019 in the setting of SVT she had moderate to severe TR with elevated PASP of 47 however on follow-up echocardiogram 4 months later showed resolution of both valvular and pulmonary pressures.  Possibly related to acute stress during SVT episode.  This was also not seen on April 2020 echocardiogram.  Could consider repeating echocardiogram however she does not demonstrate any symptoms that would necessarily indicate this.  Will discuss with MD about any other recommendations.  Likely can proceed with planned procedure.  With her history of factor V Leiden/prothrombin mutation need to be mindful of clotting risks and monitor closely.   Paroxysmal atrial fibrillation History of PSVT Does not seem to be a current issue, currently sinus.  She had loop recorder explant earlier this year in February.  Review of monitor results showed very few and infrequent episodes. PTA Eliquis 5 mg twice daily, currently being held perioperatively. On heparin.   Resume when able to do so.  When she turns 78 will need to reduce this dose to 2.5 mg for CKD and age Continue to hold beta-blocker due to bradycardia.  She is generally asymptomatic with this, does not report frequent falls   Hyperlipidemia Seen in clinic and had LDL of 77 and  elevated CK.  Stopped statin with referral to lipid clinic.  Hypertension Well-controlled off meds   CKD Factor V Leiden mutation, prothrombin mutation History of CVA/DVT    Risk Assessment/Risk Scores:   CHA2DS2-VASc Score = 6  This indicates a 9.7% annual risk of stroke. The patient's score is based upon: CHF History: 0 HTN History: 1 Diabetes History: 0 Stroke History: 2 Vascular Disease History: 0 Age Score: 2 Gender Score: 1   For questions or updates, please contact [facility] Please consult [email or web address] for contact info under    Signed, Abagail Kitchens, PA-C  03/23/2023 11:40 AM   Patient seen, examined. Available data reviewed. Agree with findings, assessment, and plan as outlined by Jasmine Alanis, PA-C.  The patient is independently interviewed and examined.  Her family is at the bedside.  She is alert, oriented, in no distress.  HEENT is normal, JVP is normal, carotid upstrokes normal without bruits, lungs are clear bilaterally, heart is regular rate and rhythm with no murmur gallop, abdomen is soft with no obvious masses, extremities have no edema, skin is warm and dry with no rash.  EKG shows sinus bradycardia 52 bpm with first-degree AV block, otherwise normal.  The patient's history is reviewed and is pertinent for paroxysmal atrial fibrillation on apixaban.  She has a very low burden of atrial fibrillation on previous assessment with an implantable loop recorder.  She is currently having no symptoms of arrhythmia.  She also has a prior history of tricuspid regurgitation but on her most recent echocardiogram studies in 2019 and 2020, there was only mild tricuspid regurgitation.  LV and RV function were normal.  She has no symptoms of heart failure or angina.  She has no symptoms of arrhythmia.  There is no audible murmur on her exam.  The patient is able to achieve well above 4 metabolic equivalents with no exertional symptoms.  She does housework, vacuums, and  can climb stairs with a laundry basket without any exertional symptoms.  She can proceed with noncardiac surgery at low risk of cardiac complication. Otherwise findings and recommendations as outlined above.   Tonny Bollman, M.D. 03/23/2023 2:27 PM

## 2023-03-23 NOTE — Progress Notes (Addendum)
PHARMACY - ANTICOAGULATION CONSULT NOTE  Pharmacy Consult for IV Heparin  Indication: atrial fibrillation  Patient Measurements: Height: 5' 1.5" (156.2 cm) Weight: 75.3 kg (166 lb) IBW/kg (Calculated) : 48.95 Heparin Dosing Weight: 65 kg  Vital Signs: Temp: 97.5 F (36.4 C) (11/25 0023) Temp Source: Oral (11/25 0023) BP: 125/62 (11/25 0023) Pulse Rate: 51 (11/25 0023)  Labs: Recent Labs    03/22/23 0218 03/22/23 0353 03/22/23 1957 03/23/23 0305  HGB 11.3*  --   --  10.4*  HCT 34.0*  --   --  31.7*  PLT 237  --   --  223  APTT  --   --  105* 116*  HEPARINUNFRC  --   --  >1.10* >1.10*  CREATININE 1.97*  --   --  2.38*  TROPONINIHS 6 7  --   --     Estimated Creatinine Clearance: 18 mL/min (A) (by C-G formula based on SCr of 2.38 mg/dL (H)).  Assessment: 77 yo female admitted for cholelithiasis with abdominal pain. PMH of Factor V Leiden and pAF on eliquis. Last dose 11/23 at 8pm. Will defer bolus. Pharmacy consulted for heparin dosing. CBC shows Hgb decrease from 11.3 > 10.4 and plts 237 > 223, sCr bumoed up from 1.97 > 2.38. aPTT above goal 116 after dose decrease from heparin 950 units/hr to 800 units/hr. No issue with heparin infusion or bleeding reported. Labs drawn appropriately.  Goal of Therapy:  Heparin level 0.3-0.7 units/ml aPTT 66-102 seconds Monitor platelets by anticoagulation protocol: Yes   Plan:   Decrease heparin infusion to 700 units/hr Check aPTT in 8 hours  Continue to monitor heparin levels and aPTT daily until levels correlate, switch to heparin levels Continue to monitor H&H and platelets  Thank you for allowing pharmacy to be a part of this patient's care.  Arabella Merles, PharmD. Clinical Pharmacist 03/23/2023 4:41 AM

## 2023-03-23 NOTE — Progress Notes (Signed)
PHARMACY - ANTICOAGULATION CONSULT NOTE  Pharmacy Consult for IV Heparin  Indication: atrial fibrillation  Patient Measurements: Height: 5' 1.5" (156.2 cm) Weight: 74.3 kg (163 lb 14.4 oz) IBW/kg (Calculated) : 48.95 Heparin Dosing Weight: 65 kg  Vital Signs: Temp: 98.1 F (36.7 C) (11/25 1948) Temp Source: Oral (11/25 1948) BP: 119/50 (11/25 1948) Pulse Rate: 53 (11/25 1948)  Labs: Recent Labs    03/22/23 0218 03/22/23 0353 03/22/23 1957 03/22/23 1957 03/23/23 0305 03/23/23 1255 03/23/23 2235  HGB 11.3*  --   --   --  10.4*  --   --   HCT 34.0*  --   --   --  31.7*  --   --   PLT 237  --   --   --  223  --   --   APTT  --   --  105*   < > 116* 78* 87*  HEPARINUNFRC  --   --  >1.10*  --  >1.10*  --   --   CREATININE 1.97*  --   --   --  2.38*  --   --   TROPONINIHS 6 7  --   --   --   --   --    < > = values in this interval not displayed.    Estimated Creatinine Clearance: 17.9 mL/min (A) (by C-G formula based on SCr of 2.38 mg/dL (H)).  Assessment: 77 yo female admitted for cholelithiasis with abdominal pain. PMH of Factor V Leiden and pAF on eliquis. Last dose 11/23 at 8pm. Will defer bolus. Pharmacy consulted for heparin dosing.   Confirmatory aPTT now within goal range at 87, on heparin infusion at 700 units/hr. No s/sx of bleeding or infusion issues per RN.   Goal of Therapy:  Heparin level 0.3-0.7 units/ml aPTT 66-102 seconds Monitor platelets by anticoagulation protocol: Yes   Plan:   Continue heparin infusion at 700 units/hr Continue to monitor heparin levels and aPTT daily until levels correlate, switch to heparin levels Continue to monitor H&H and platelets  Thank you for allowing pharmacy to participate in this patient's care,  Arabella Merles, PharmD. Clinical Pharmacist 03/23/2023 11:58 PM

## 2023-03-23 NOTE — Progress Notes (Signed)
Mobility Specialist Progress Note:   03/23/23 0930  Mobility  Activity Ambulated with assistance in hallway  Level of Assistance Standby assist, set-up cues, supervision of patient - no hands on  Assistive Device None  Distance Ambulated (ft) 450 ft  Activity Response Tolerated well  Mobility Referral Yes  $Mobility charge 1 Mobility  Mobility Specialist Start Time (ACUTE ONLY) [id number]  Mobility Specialist Stop Time (ACUTE ONLY) [id number]  Mobility Specialist Time Calculation (min) (ACUTE ONLY) 13 min   Received pt in bed having no complaints and agreeable to mobility. Pt was asymptomatic throughout ambulation and returned to room w/o fault. Left seated EOB w/ call bell in reach and all needs met.   Thompson Grayer Mobility Specialist  Please contact vis Secure Chat or  Rehab Office [phone]

## 2023-03-23 NOTE — Progress Notes (Signed)
Subjective/Chief Complaint: No abd pain Awaiting [place] report   Objective: Vital signs in last 24 hours: Temp:  [97.5 F (36.4 C)-98.9 F (37.2 C)] 97.8 F (36.6 C) (11/25 0715) Pulse Rate:  [45-97] 50 (11/25 0715) Resp:  [13-18] 13 (11/25 0715) BP: (106-138)/(41-62) 119/59 (11/25 0715) SpO2:  [97 %-100 %] 100 % (11/25 0715) Weight:  [74.3 kg-75.3 kg] 74.3 kg (11/25 0444) Last BM Date : 03/20/23 (PTA, per Pt)  Intake/Output from previous day: 11/24 0701 - 11/25 0700 In: 71.6 [I.V.:52.8; IV Piggyback:18.8] Out: 1400 [Urine:1400] Intake/Output this shift: Total I/O In: -  Out: 700 [Urine:700]  PE:  Constitutional: No acute distress, conversant, appears states age. Eyes: Anicteric sclerae, moist conjunctiva, no lid lag Lungs: Clear to auscultation bilaterally, normal respiratory effort CV: regular rate and rhythm, no murmurs, no peripheral edema, pedal pulses 2+ GI: Soft, no masses or hepatosplenomegaly, non-tender to palpation Skin: No rashes, palpation reveals normal turgor Psychiatric: appropriate judgment and insight, oriented to person, place, and time   Lab Results:  Recent Labs    03/22/23 0218 03/23/23 0305  WBC 6.6 5.6  HGB 11.3* 10.4*  HCT 34.0* 31.7*  PLT 237 223   BMET Recent Labs    03/22/23 0218 03/23/23 0305  NA 131* 131*  K 3.8 3.4*  CL 95* 99  CO2 23 25  GLUCOSE 97 89  BUN 41* 31*  CREATININE 1.97* 2.38*  CALCIUM 9.4 8.6*   PT/INR No results for input(s): "LABPROT", "INR" in the last 72 hours. ABG No results for input(s): "PHART", "HCO3" in the last 72 hours.  Invalid input(s): "PCO2", "PO2"  Studies/Results: CT ABDOMEN PELVIS WO CONTRAST  Result Date: 03/22/2023 CLINICAL DATA:  77 year old female with epigastric pain since 1630 hours. Abdominal swelling and nausea. History of large liver cyst. EXAM: CT ABDOMEN AND PELVIS WITHOUT CONTRAST TECHNIQUE: Multidetector CT imaging of the abdomen and pelvis was performed following the  standard protocol without IV contrast. RADIATION DOSE REDUCTION: This exam was performed according to the departmental dose-optimization program which includes automated exposure control, adjustment of the mA and/or kV according to patient size and/or use of iterative reconstruction technique. COMPARISON:  Noncontrast CT Abdomen and Pelvis 12/12/2021. FINDINGS: Lower chest: Mild chronic left lower lobe linear atelectasis or scarring. No cardiomegaly, pericardial or pleural effusion. Hepatobiliary: Cholelithiasis with individual gallstones up to 11 mm. Gallbladder is larger, and there is evidence of abnormal gallbladder wall thickening or inflammation on coronal image 70. No bile duct dilatation. Chronic but enlarging hepatic cyst 1st visible in 2018 again has a bilobed appearance, arising to the right of the falciform ligament. Currently the cyst is up to 70 mm long axis and estimated cyst volume is 120 mL (versus 140 mL last year. Mild distortion on the hepatic contour appears stable. No perihepatic fluid. And otherwise negative noncontrast liver parenchyma. Pancreas: Negative noncontrast appearance. Spleen: Negative. Adrenals/Urinary Tract: Negative adrenal glands and left kidney. Right renal posterior midpole simple fluid density cyst (no follow-up imaging recommended). No nephrolithiasis or hydronephrosis. Decompressed ureters and bladder. Stable pelvic phleboliths. Stomach/Bowel: Increased stool ball in the rectum (sagittal image 83). Upstream sigmoid colon retained stool and mild redundancy. Descending colon is decompressed. Ordinary transverse and right colon retained gas and stool. Normal appendix on series 2, image 49. Decompressed terminal ileum. No dilated small bowel. Noncontrast stomach and duodenum appear negative. Vascular/Lymphatic: Extensive Aortoiliac calcified atherosclerosis. Normal caliber abdominal aorta. Vascular patency is not evaluated in the absence of IV contrast. No lymphadenopathy  identified. Reproductive:  Stable and negative noncontrast appearance. Other: No pelvis free fluid. Musculoskeletal: Lower thoracic interbody ankylosis from flowing endplate osteophytes. Degenerative interbody ankylosis in the lower lumbar spine at L4-L5. Additional lumbar degeneration. No acute osseous abnormality identified. IMPRESSION: 1. Chronic cholelithiasis with increased gallbladder distension since last year and questionable early gallbladder inflammation by CT. Consider Acute Cholecystitis and right upper quadrant ultrasound would be valuable. 2. No other acute finding in the noncontrast Abdomen. 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 No complicating features are evident. 3. Stool ball in the rectum, cannot exclude fecal impaction. But no bowel obstruction. Normal appendix. 4.  Aortic Atherosclerosis (ICD10-I70.0). Electronically Signed   By: Odessa Fleming M.D.   On: 03/22/2023 04:52    Anti-infectives: Anti-infectives (From admission, onward)    Start     Dose/Rate Route Frequency Ordered Stop   03/22/23 1400  piperacillin-tazobactam (ZOSYN) IVPB 3.375 g  Status:  Discontinued        3.375 g 100 mL/hr over 30 Minutes Intravenous Every 8 hours 03/22/23 0911 03/22/23 0932   03/22/23 1400  piperacillin-tazobactam (ZOSYN) IVPB 3.375 g        3.375 g 12.5 mL/hr over 240 Minutes Intravenous Every 8 hours 03/22/23 0932     03/22/23 0530  piperacillin-tazobactam (ZOSYN) IVPB 3.375 g        3.375 g 100 mL/hr over 30 Minutes Intravenous  Once 03/22/23 0525 03/22/23 8469       Assessment/Plan: Cholelithiasis  Abdominal pain and nausea - hold eliquis, ok to have heparin gtt  - will f/u on [place] and plan HIDA if inconclusive - recommend cardiology consult for perioperative risk stratification - likely needs updated ECHO Constipation - miralax, suppository prn    FEN: NPO, IVF per [facility] - NPO after MN VTE: ok to have hep gtt, hold  eliquis ID: Zosyn 11/24>>   - per [facility] -  Factor V Leiden on Eliquis PAF ILR implant  Pulmonary HTN Hx of CVA CKD stage IV HTN HLD  Moderate MDM   LOS: 1 day    Axel Filler 03/23/2023

## 2023-03-23 NOTE — Progress Notes (Signed)
PHARMACY NOTE:  ANTIMICROBIAL RENAL DOSAGE ADJUSTMENT  Current antimicrobial regimen includes a mismatch between antimicrobial dosage and estimated renal function.  As per policy approved by the Pharmacy & Therapeutics and Medical Executive Committees, the antimicrobial dosage will be adjusted accordingly.  Current antimicrobial dosage:  zosyn 3.375g every 8 hours (extended infusion)  Indication: intra-abdominal infection  Renal Function:  Estimated Creatinine Clearance: 17.9 mL/min (A) (by C-G formula based on SCr of 2.38 mg/dL (H)). []      On intermittent HD, scheduled: []      On CRRT    Antimicrobial dosage has been changed to:  zosyn 2.25 g IV every 6 hours  Additional comments:  Thank you for allowing pharmacy to participate in this patient's care,  Sherron Monday, PharmD, BCCCP Clinical Pharmacist  Phone: [phone] 03/23/2023 2:25 PM  Please check AMION for all [facility] Pharmacy phone numbers After 10:00 PM, call Main Pharmacy [phone]

## 2023-03-24 ENCOUNTER — Inpatient Hospital Stay (HOSPITAL_COMMUNITY): Payer: Medicare Other

## 2023-03-24 DIAGNOSIS — R1013 Epigastric pain: Secondary | ICD-10-CM | POA: Diagnosis not present

## 2023-03-24 LAB — COMPREHENSIVE METABOLIC PANEL
ALT: 10 U/L (ref 0–44)
AST: 28 U/L (ref 15–41)
Albumin: 3.8 g/dL (ref 3.5–5.0)
Alkaline Phosphatase: 59 U/L (ref 38–126)
Anion gap: 10 (ref 5–15)
BUN: 26 mg/dL — ABNORMAL HIGH (ref 8–23)
CO2: 24 mmol/L (ref 22–32)
Calcium: 9.5 mg/dL (ref 8.9–10.3)
Chloride: 101 mmol/L (ref 98–111)
Creatinine, Ser: 2.6 mg/dL — ABNORMAL HIGH (ref 0.44–1.00)
GFR, Estimated: 18 mL/min — ABNORMAL LOW (ref >60)
Glucose, Bld: 104 mg/dL — ABNORMAL HIGH (ref 70–99)
Potassium: 3.9 mmol/L (ref 3.5–5.1)
Sodium: 135 mmol/L (ref 135–145)
Total Bilirubin: 1 mg/dL (ref <1.2)
Total Protein: 7.6 g/dL (ref 6.5–8.1)

## 2023-03-24 LAB — CBC
HCT: 34.8 % — ABNORMAL LOW (ref 36.0–46.0)
Hemoglobin: 11.5 g/dL — ABNORMAL LOW (ref 12.0–15.0)
MCH: 28.6 pg (ref 26.0–34.0)
MCHC: 33 g/dL (ref 30.0–36.0)
MCV: 86.6 fL (ref 80.0–100.0)
Platelets: 253 10*3/uL (ref 150–400)
RBC: 4.02 MIL/uL (ref 3.87–5.11)
RDW: 14 % (ref 11.5–15.5)
WBC: 6.4 10*3/uL (ref 4.0–10.5)
nRBC: 0 % (ref 0.0–0.2)

## 2023-03-24 LAB — APTT: aPTT: >200 s (ref 24–36)

## 2023-03-24 LAB — HEPARIN LEVEL (UNFRACTIONATED): Heparin Unfractionated: >1.1 [IU]/mL — ABNORMAL HIGH (ref 0.30–0.70)

## 2023-03-24 LAB — PHOSPHORUS: Phosphorus: 3.3 mg/dL (ref 2.5–4.6)

## 2023-03-24 LAB — MAGNESIUM: Magnesium: 2.2 mg/dL (ref 1.7–2.4)

## 2023-03-24 MED ORDER — HEPARIN (PORCINE) 25000 UT/250ML-% IV SOLN: 550.0000 [IU]/h | INTRAVENOUS | Status: DC

## 2023-03-24 MED ORDER — TECHNETIUM TC 99M MEBROFENIN IV KIT
5.0000 | PACK | Freq: Once | INTRAVENOUS | Status: AC | PRN
  Administered 2023-03-24: 5 via INTRAVENOUS

## 2023-03-24 MED ORDER — APIXABAN 5 MG PO TABS
5.0000 mg | ORAL_TABLET | Freq: Two times a day (BID) | ORAL | Status: DC
  Administered 2023-03-24: 5 mg via ORAL
  Filled 2023-03-24: qty 1

## 2023-03-24 NOTE — TOC Transition Note (Signed)
Transition of Care [facility]) - CM/SW Discharge Note   Patient Details  Name: Jasmine Dawson MRN: [id number] Date of Birth: 19-Oct-1945  [facility] CM/SW Contact:  Leone Haven, RN Phone Number: 03/24/2023, 1:59 PM   Clinical Narrative:    For dc today, has no needs. HIDA scan negative. She has transportation.    Final next level of care: Home/Self Care Barriers to Discharge: Continued Medical Work up   Patient Goals and CMS Choice   Choice offered to / list presented to : NA  Discharge Placement                         Discharge Plan and Services Additional resources added to the After Visit Summary for   In-house Referral: NA Discharge Planning Services: CM Consult Post Acute Care Choice: NA          DME Arranged: N/A DME Agency: NA       HH Arranged: NA          Social Determinants of Health (SDOH) Interventions SDOH Screenings   Food Insecurity: No Food Insecurity (03/22/2023)  Housing: Medium Risk (03/22/2023)  Transportation Needs: No Transportation Needs (03/22/2023)  Utilities: Not At Risk (03/22/2023)  Tobacco Use: Low Risk  (03/22/2023)     Readmission Risk Interventions     No data to display

## 2023-03-24 NOTE — Progress Notes (Signed)
Patient and spouse have received discharge instructions to include medications and follow up appointment. Patient verbalizes understanding of instructions.

## 2023-03-24 NOTE — Discharge Instructions (Signed)
Advised to follow-up with primary care physician in 1 week. Advised to resume Eliquis from today. Advised to hold metoprolol due to bradycardia.  Follow-up with PCP to resume if heart rate improved Advised to follow-up with general surgery for elective cholecystectomy for gallstones

## 2023-03-24 NOTE — Discharge Summary (Signed)
Physician Discharge Summary  Jasmine Dawson:[id number] DOB: [birth year] DOA: 03/22/2023  PCP: Leilani Able, MD  Admit date: 03/22/2023  Discharge date: 03/24/2023  Admitted From: Home  Disposition: Home  Recommendations for Outpatient Follow-up:  Follow up with PCP in 1-2 weeks. Please obtain BMP/CBC in one week Advised to resume Eliquis from today. Advised to hold metoprolol due to bradycardia.  Follow-up with PCP to resume if heart rate improved. Advised to follow-up with general surgery for elective cholecystectomy for gallstones.  Home Health: None Equipment/Devices: None  Discharge Condition: Stable CODE STATUS:Full code Diet recommendation: Heart Healthy   Brief [facility] Course: This 77 yrs old female with PMH significant of CKD stage IV, heterozygous factor V Leiden mutation, prothrombin gene mutation, hyperlipidemia, hypertension, paroxysmal A-fib on Eliquis, pulmonary hypertension, sinus bradycardia, Hx. of stroke, DVT, PE, SVT, and tricuspid regurgitation who presented with complaints of mid abdominal pain.  Her abdominal pain started after 1 hour after consuming an impossible burger for the first time.  Patient reports having intermittent constipation. In the ED patient was found to be bradycardic, lipase 162, LFTs WNL. CTA/P showed chronic cholelithiasis with increased gallbladder wall distention. Patient was admitted for further evaluation.  General surgery was consulted, Eliquis kept on hold,  started on IV Zosyn.  Cardiology consulted for preoperative clearance.  Patient was cleared with low risk for low risk procedure.  Patient's abdominal pain has resolved.  HIDA scan was negative.  General surgery states there is no indication for cholecystectomy.  Patient tolerated regular diet and wants to be discharged.  Patient is being discharged home and resumed on Eliquis.  Discharge Diagnoses:  Principal Problem:   Epigastric pain Active Problems:    Cholelithiasis   Acute cholecystitis   Paroxysmal atrial fibrillation (HCC)   Clotting disorder (HCC)   Chronic anticoagulation   CKD (chronic kidney disease), stage IV (HCC)   Constipation   Preop cardiovascular exam  Cholelithiasis with possible cholecystitis: Patient presented with epigastric abdominal pain, about an hour after eating Impossible burger for the first time. Lipase 196, LFTs WNL, CT A/P: Chronic cholelithiasis with increased gallbladder wall thickening, concern for cholecystitis Initiated on IV Zosyn for abdominal infection. Continue IV Zofran for as needed for nausea and vomiting Adequate pain control with pain meds. Started on clear liquid diet, Follow-up ultrasound, may require HIDA scan if inconclusive General surgery consulted.  Eliquis on hold. Cardiology consulted for preoperative clearance, cleared with low risk. HIDA scan negative.  Patient's abdominal pain has resolved. General surgery no indication for cholecystectomy.  Then patient is discharged home.   History of DVT: Paroxysmal atrial fibrillation on anticoagulation: Patient has history of clotting disorder and is on Eliquis for paroxysmal atrial fibrillation.   Appears to be in sinus bradycardia at this time with stable blood pressure.   Eliquis is on hold.  Continue IV heparin for now. HR controlled. Hold atenolol due to bradycardia. Resumed on Eliquis.   CKD stage IV: Patient has history of stage 4 chronic kidney disease.  Her baseline creatinine is between 2 and 2.3. She follows with [name] at [facility]. Continue fluid management.   Hyperlipidemia: Continue Crestor 10 mg daily.   Hypertension: Hold atenolol as blood pressure is better and heart rate is low.   Constipation: Continue Senokot and Colace.    Discharge Instructions  Discharge Instructions     Call MD for:  difficulty breathing, headache or visual disturbances   Complete by: As directed    Call  MD for:  persistant dizziness or light-headedness   Complete by: As directed    Call MD for:  persistant nausea and vomiting   Complete by: As directed    Diet - low sodium heart healthy   Complete by: As directed    Diet Carb Modified   Complete by: As directed    Discharge instructions   Complete by: As directed    Advised to follow-up with primary care physician in 1 week. Advised to resume Eliquis from today. Advised to hold metoprolol due to bradycardia.  Follow-up with PCP to resume if heart rate improved Advised to follow-up with general surgery for elective cholecystectomy for gallstones   Increase activity slowly   Complete by: As directed       Allergies as of 03/24/2023       Reactions   Cefdinir    Other reaction(s): vomiting   Egg-derived Products Anaphylaxis, Swelling   Orange Fruit [citrus] Anaphylaxis, Swelling   Prednisone Anaphylaxis, Hives, Itching   Sulfa Antibiotics Anaphylaxis, Hives, Itching   Tomato Anaphylaxis, Swelling   Atorvastatin    Muscle cramps   Influenza Vaccines Other (See Comments)   unknown   Aspartame And Phenylalanine Palpitations   Caffeine Palpitations   Shellfish Allergy Itching, Nausea And Vomiting, Rash        Medication List     STOP taking these medications    atenolol 50 MG tablet Commonly known as: TENORMIN   meclizine 12.5 MG tablet Commonly known as: ANTIVERT   rosuvastatin 10 MG tablet Commonly known as: CRESTOR   triamterene-hydrochlorothiazide 37.5-25 MG capsule Commonly known as: DYAZIDE       TAKE these medications    Calcium-Vitamin D 600-3.125 MG-MCG Tabs Take 1 tablet by mouth daily.   carboxymethylcellulose 0.5 % Soln Commonly known as: REFRESH PLUS Place 1 drop into both eyes daily as needed (for dry eyes).   CORICIDIN D COLD/FLU/SINUS PO Take 2 tablets by mouth as needed (Allergies).   Eliquis 5 MG Tabs tablet Generic drug: apixaban TAKE 1 TABLET(5 MG) BY MOUTH TWICE DAILY    famotidine 20 MG tablet Commonly known as: PEPCID Take 20 mg by mouth 2 (two) times daily.   fexofenadine-pseudoephedrine 180-240 MG 24 hr tablet Commonly known as: ALLEGRA-D 24 Take 1 tablet by mouth daily as needed (allergies).        Follow-up Information     Leilani Able, MD Follow up in 1 week(s).   Specialty: Family Medicine Contact information: [address] [place] [address] [phone]                Allergies  Allergen Reactions   Cefdinir     Other reaction(s): vomiting   Egg-Derived Products Anaphylaxis and Swelling   Orange Fruit [Citrus] Anaphylaxis and Swelling   Prednisone Anaphylaxis, Hives and Itching   Sulfa Antibiotics Anaphylaxis, Hives and Itching   Tomato Anaphylaxis and Swelling   Atorvastatin     Muscle cramps    Influenza Vaccines Other (See Comments)    unknown   Aspartame And Phenylalanine Palpitations   Caffeine Palpitations   Shellfish Allergy Itching, Nausea And Vomiting and Rash    Consultations: General Surgery   Procedures/Studies: NM Hepatobiliary Liver Func  Result Date: 03/24/2023 CLINICAL DATA:  Abdominal pain and swelling. EXAM: NUCLEAR MEDICINE HEPATOBILIARY IMAGING TECHNIQUE: Sequential images of the abdomen were obtained out to 60 minutes following intravenous administration of radiopharmaceutical. RADIOPHARMACEUTICALS:  Five mCi Tc-61m  Choletec IV COMPARISON:  None Available. FINDINGS: Prompt  uptake and biliary excretion of activity by the liver is seen. Gallbladder activity is visualized, consistent with patency of cystic duct. Biliary activity passes into small bowel, consistent with patent common bile duct. IMPRESSION: No common duct or cystic duct obstruction. Electronically Signed   By: Karen Kays M.D.   On: 03/24/2023 11:12   US Abdomen Limited RUQ (LIVER/GB)  Result Date: 03/23/2023 : PROCEDURE: ULTRASOUND ABDOMEN LIMITED HISTORY: Patient is a 77 y/o Female with 6194 Acute cholecystitis 6194  COMPARISON: CT abdomen performed same day, MR abdomen 02/04/2022, U/S abdomen limited 05/17/2015. TECHNIQUE: Two-dimensional grayscale and color Doppler ultrasound of the abdomen was performed. FINDINGS: The pancreas demonstrates a normal homogenous echotexture with the tail suboptimally visualized due to overlying bowel gas. The liver demonstrates normal echotexture without intrahepatic biliary dilatation. There is a 7.5 x 5.3 x 6.1 cm anechoic cyst visualized within the left lobe. The main portal vein demonstrates normal hepatopedal flow. The gallbladder demonstrates multiple stones contained by a fold. There is prominent wall thickness without pericholecystic fluid. The common bile duct measures 0.3 cm. Negative sonographic Murphy's sign. IMPRESSION: 1. Left hepatic lobe 7.5 cm simple cyst. No sonographic evidence of solid mass. 2. Multiple gallstones with prominent wall thickness. No pericholecystic fluid. Negative sonographic Murphy's sign. Thank you for allowing [place] to assist in the care of this patient. Electronically Signed   By: Lestine Box M.D.   On: 03/23/2023 12:39   CT ABDOMEN PELVIS WO CONTRAST  Result Date: 03/22/2023 CLINICAL DATA:  77 year old female with epigastric pain since 1630 hours. Abdominal swelling and nausea. History of large liver cyst. EXAM: CT ABDOMEN AND PELVIS WITHOUT CONTRAST TECHNIQUE: Multidetector CT imaging of the abdomen and pelvis was performed following the standard protocol without IV contrast. RADIATION DOSE REDUCTION: This exam was performed according to the departmental dose-optimization program which includes automated exposure control, adjustment of the mA and/or kV according to patient size and/or use of iterative reconstruction technique. COMPARISON:  Noncontrast CT Abdomen and Pelvis 12/12/2021. FINDINGS: Lower chest: Mild chronic left lower lobe linear atelectasis or scarring. No cardiomegaly, pericardial or pleural effusion. Hepatobiliary: Cholelithiasis with  individual gallstones up to 11 mm. Gallbladder is larger, and there is evidence of abnormal gallbladder wall thickening or inflammation on coronal image 70. No bile duct dilatation. Chronic but enlarging hepatic cyst 1st visible in 2018 again has a bilobed appearance, arising to the right of the falciform ligament. Currently the cyst is up to 70 mm long axis and estimated cyst volume is 120 mL (versus 140 mL last year. Mild distortion on the hepatic contour appears stable. No perihepatic fluid. And otherwise negative noncontrast liver parenchyma. Pancreas: Negative noncontrast appearance. Spleen: Negative. Adrenals/Urinary Tract: Negative adrenal glands and left kidney. Right renal posterior midpole simple fluid density cyst (no follow-up imaging recommended). No nephrolithiasis or hydronephrosis. Decompressed ureters and bladder. Stable pelvic phleboliths. Stomach/Bowel: Increased stool ball in the rectum (sagittal image 83). Upstream sigmoid colon retained stool and mild redundancy. Descending colon is decompressed. Ordinary transverse and right colon retained gas and stool. Normal appendix on series 2, image 49. Decompressed terminal ileum. No dilated small bowel. Noncontrast stomach and duodenum appear negative. Vascular/Lymphatic: Extensive Aortoiliac calcified atherosclerosis. Normal caliber abdominal aorta. Vascular patency is not evaluated in the absence of IV contrast. No lymphadenopathy identified. Reproductive: Stable and negative noncontrast appearance. Other: No pelvis free fluid. Musculoskeletal: Lower thoracic interbody ankylosis from flowing endplate osteophytes. Degenerative interbody ankylosis in the lower lumbar spine at L4-L5. Additional lumbar degeneration. No acute osseous  abnormality identified. IMPRESSION: 1. Chronic cholelithiasis with increased gallbladder distension since last year and questionable early gallbladder inflammation by CT. Consider Acute Cholecystitis and right upper quadrant  ultrasound would be valuable. 2. No other acute finding in the noncontrast Abdomen. 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 No complicating features are evident. 3. Stool ball in the rectum, cannot exclude fecal impaction. But no bowel obstruction. Normal appendix. 4.  Aortic Atherosclerosis (ICD10-I70.0). Electronically Signed   By: Odessa Fleming M.D.   On: 03/22/2023 04:52    Subjective: Patient was seen and examined at bedside.  Overnight events noted.   Patient reports doing much better,  wants to be discharged.  Abdominal pain has resolved,  tolerated regular diet.  Discharge Exam: Vitals:   03/24/23 0808 03/24/23 1114  BP: 130/89 (!) 149/58  Pulse: (!) 51 (!) 55  Resp: 18 20  Temp: 98.4 F (36.9 C) 98 F (36.7 C)  SpO2: 100% 100%   Vitals:   03/24/23 0045 03/24/23 0335 03/24/23 0808 03/24/23 1114  BP: (!) 128/54 (!) 135/58 130/89 (!) 149/58  Pulse: (!) 46 (!) 51 (!) 51 (!) 55  Resp: 14 16 18 20   Temp: 97.6 F (36.4 C) 98.1 F (36.7 C) 98.4 F (36.9 C) 98 F (36.7 C)  TempSrc: Oral Oral Oral Oral  SpO2: 99% 100% 100% 100%  Weight:  73.7 kg    Height:        General: Pt is alert, awake, not in acute distress Cardiovascular: RRR, S1/S2 +, no rubs, no gallops Respiratory: CTA bilaterally, no wheezing, no rhonchi Abdominal: Soft, NT, ND, bowel sounds + Extremities: no edema, no cyanosis    The results of significant diagnostics from this hospitalization (including imaging, microbiology, ancillary and laboratory) are listed below for reference.     Microbiology: No results found for this or any previous visit (from the past 240 hour(s)).   Labs: BNP (last 3 results) No results for input(s): "BNP" in the last 8760 hours. Basic Metabolic Panel: Recent Labs  Lab 03/22/23 0218 03/23/23 0305 03/24/23 0304  NA 131* 131* 135  K 3.8 3.4* 3.9  CL 95* 99 101  CO2 23 25 24   GLUCOSE 97 89 104*  BUN 41* 31* 26*   CREATININE 1.97* 2.38* 2.60*  CALCIUM 9.4 8.6* 9.5  MG  --   --  2.2  PHOS  --   --  3.3   Liver Function Tests: Recent Labs  Lab 03/22/23 0218 03/23/23 0305 03/24/23 0304  AST 24 23 28   ALT 6 9 10   ALKPHOS 64 54 59  BILITOT 0.4 0.6 1.0  PROT 7.8 6.6 7.6  ALBUMIN 4.3 3.4* 3.8   Recent Labs  Lab 03/22/23 0218  LIPASE 162*   No results for input(s): "AMMONIA" in the last 168 hours. CBC: Recent Labs  Lab 03/22/23 0218 03/23/23 0305 03/24/23 0304  WBC 6.6 5.6 6.4  NEUTROABS 4.2  --   --   HGB 11.3* 10.4* 11.5*  HCT 34.0* 31.7* 34.8*  MCV 85.6 85.9 86.6  PLT 237 223 253   Cardiac Enzymes: No results for input(s): "CKTOTAL", "CKMB", "CKMBINDEX", "TROPONINI" in the last 168 hours. BNP: Invalid input(s): "POCBNP" CBG: No results for input(s): "GLUCAP" in the last 168 hours. D-Dimer No results for input(s): "DDIMER" in the last 72 hours. Hgb A1c No results for input(s): "HGBA1C" in the last 72 hours. Lipid Profile Recent Labs    03/23/23 0305  03/23/23 0307  CHOL  --  190  HDL  --  67  LDLCALC  --  010*  TRIG 59 56  CHOLHDL  --  2.8   Thyroid function studies No results for input(s): "TSH", "T4TOTAL", "T3FREE", "THYROIDAB" in the last 72 hours.  Invalid input(s): "FREET3" Anemia work up No results for input(s): "VITAMINB12", "FOLATE", "FERRITIN", "TIBC", "IRON", "RETICCTPCT" in the last 72 hours. Urinalysis    Component Value Date/Time   COLORURINE COLORLESS (A) 03/22/2023 0353   APPEARANCEUR CLEAR 03/22/2023 0353   LABSPEC 1.007 03/22/2023 0353   PHURINE 6.0 03/22/2023 0353   GLUCOSEU NEGATIVE 03/22/2023 0353   HGBUR NEGATIVE 03/22/2023 0353   BILIRUBINUR NEGATIVE 03/22/2023 0353   KETONESUR NEGATIVE 03/22/2023 0353   PROTEINUR NEGATIVE 03/22/2023 0353   UROBILINOGEN 0.2 09/10/2011 2141   NITRITE NEGATIVE 03/22/2023 0353   LEUKOCYTESUR NEGATIVE 03/22/2023 0353   Sepsis Labs Recent Labs  Lab 03/22/23 0218 03/23/23 0305 03/24/23 0304  WBC  6.6 5.6 6.4   Microbiology No results found for this or any previous visit (from the past 240 hour(s)).   Time coordinating discharge: Over 30 minutes  SIGNED:   Willeen Niece, MD  [facility] 03/24/2023, 3:29 PM Pager   If 7PM-7AM, please contact night-coverage

## 2023-03-24 NOTE — Progress Notes (Signed)
PHARMACY - ANTICOAGULATION CONSULT NOTE  Pharmacy Consult for heparin Indication: atrial fibrillation  Labs: Recent Labs    03/22/23 0218 03/22/23 0218 03/22/23 0353 03/22/23 1957 03/23/23 0305 03/23/23 1255 03/23/23 2235 03/24/23 0304  HGB 11.3*  --   --   --  10.4*  --   --  11.5*  HCT 34.0*  --   --   --  31.7*  --   --  34.8*  PLT 237  --   --   --  223  --   --  253  APTT  --    < >  --  105* 116* 78* 87* >200*  HEPARINUNFRC  --   --   --  >1.10* >1.10*  --   --  >1.10*  CREATININE 1.97*  --   --   --  2.38*  --   --  2.60*  TROPONINIHS 6  --  7  --   --   --   --   --    < > = values in this interval not displayed.   Assessment: 77yo female supratherapeutic on heparin after two PTTs at goal; no infusion issues or signs per RN though she notes a small amount of blood at IV site.  Goal of Therapy:  aPTT 66-102 seconds   Plan:  Hold heparin infusion x1h. Decrease heparin infusion by 20% units/kg/hr to 550 units/hr. Check level in 8 hours.   Vernard Gambles, PharmD, BCPS 03/24/2023 5:18 AM

## 2023-03-24 NOTE — Progress Notes (Signed)
Patient ID: Jasmine Dawson, female   DOB: Sep 08, 1945, 77 y.o.   MRN: [id number] [facility] Surgery Progress Note     Subjective: CC-  No abdominal complaints. Denies pain, nausea, vomiting. Feels hungry. HIDA completed this morning, negative for cholecystitis.   Objective: Vital signs in last 24 hours: Temp:  [97.6 F (36.4 C)-98.4 F (36.9 C)] 98 F (36.7 C) (11/26 1114) Pulse Rate:  [46-55] 55 (11/26 1114) Resp:  [12-20] 20 (11/26 1114) BP: (111-149)/(50-89) 149/58 (11/26 1114) SpO2:  [99 %-100 %] 100 % (11/26 1114) Weight:  [73.7 kg] 73.7 kg (11/26 0335) Last BM Date : 03/20/23  Intake/Output from previous day: 11/25 0701 - 11/26 0700 In: 360.5 [I.V.:189.5; IV Piggyback:171] Out: 1100 [Urine:1100] Intake/Output this shift: Total I/O In: 0  Out: 100 [Urine:100]  PE: Gen:  Alert, NAD, pleasant Abd: soft, ND, NT  Lab Results:  Recent Labs    03/23/23 0305 03/24/23 0304  WBC 5.6 6.4  HGB 10.4* 11.5*  HCT 31.7* 34.8*  PLT 223 253   BMET Recent Labs    03/23/23 0305 03/24/23 0304  NA 131* 135  K 3.4* 3.9  CL 99 101  CO2 25 24  GLUCOSE 89 104*  BUN 31* 26*  CREATININE 2.38* 2.60*  CALCIUM 8.6* 9.5   PT/INR No results for input(s): "LABPROT", "INR" in the last 72 hours. CMP     Component Value Date/Time   NA 135 03/24/2023 0304   NA 139 03/05/2023 1031   K 3.9 03/24/2023 0304   CL 101 03/24/2023 0304   CO2 24 03/24/2023 0304   GLUCOSE 104 (H) 03/24/2023 0304   BUN 26 (H) 03/24/2023 0304   BUN 42 (H) 03/05/2023 1031   CREATININE 2.60 (H) 03/24/2023 0304   CALCIUM 9.5 03/24/2023 0304   PROT 7.6 03/24/2023 0304   PROT 7.5 03/05/2023 1031   ALBUMIN 3.8 03/24/2023 0304   ALBUMIN 4.5 03/05/2023 1031   AST 28 03/24/2023 0304   ALT 10 03/24/2023 0304   ALKPHOS 59 03/24/2023 0304   BILITOT 1.0 03/24/2023 0304   BILITOT 0.3 03/05/2023 1031   GFRNONAA 18 (L) 03/24/2023 0304   GFRAA 27 (L) 04/09/2020 0915   Lipase     Component Value  Date/Time   LIPASE 162 (H) 03/22/2023 0218       Studies/Results: NM Hepatobiliary Liver Func  Result Date: 03/24/2023 CLINICAL DATA:  Abdominal pain and swelling. EXAM: NUCLEAR MEDICINE HEPATOBILIARY IMAGING TECHNIQUE: Sequential images of the abdomen were obtained out to 60 minutes following intravenous administration of radiopharmaceutical. RADIOPHARMACEUTICALS:  Five mCi Tc-53m  Choletec IV COMPARISON:  None Available. FINDINGS: Prompt uptake and biliary excretion of activity by the liver is seen. Gallbladder activity is visualized, consistent with patency of cystic duct. Biliary activity passes into small bowel, consistent with patent common bile duct. IMPRESSION: No common duct or cystic duct obstruction. Electronically Signed   By: Karen Kays M.D.   On: 03/24/2023 11:12    Anti-infectives: Anti-infectives (From admission, onward)    Start     Dose/Rate Route Frequency Ordered Stop   03/23/23 2000  piperacillin-tazobactam (ZOSYN) IVPB 2.25 g        2.25 g 100 mL/hr over 30 Minutes Intravenous Every 6 hours 03/23/23 1424     03/22/23 1400  piperacillin-tazobactam (ZOSYN) IVPB 3.375 g  Status:  Discontinued        3.375 g 100 mL/hr over 30 Minutes Intravenous Every 8 hours 03/22/23 0911 03/22/23 0932   03/22/23 1400  piperacillin-tazobactam (ZOSYN) IVPB 3.375 g  Status:  Discontinued        3.375 g 12.5 mL/hr over 240 Minutes Intravenous Every 8 hours 03/22/23 0932 03/23/23 1424   03/22/23 0530  piperacillin-tazobactam (ZOSYN) IVPB 3.375 g        3.375 g 100 mL/hr over 30 Minutes Intravenous  Once 03/22/23 0525 03/22/23 0981        Assessment/Plan Cholelithiasis  Abdominal pain and nausea - resolved - HIDA negative for cholecystitis. No indication for cholecystectomy. I wrote for regular diet, if patient tolerates PO she is ok for discharge from our standpoint. Ok to resume eliquis. We will sign off, please call with questions or concerns. Constipation - miralax,  suppository prn    FEN: reg diet VTE: ok to resume eliquis ID: [email or web address] 11/24>> does not need any more abx from surgical standpoint   - per [facility] -  Factor V Leiden on Eliquis PAF Hx DVT ILR implant  Pulmonary HTN Hx of CVA CKD stage IV HTN HLD  I reviewed hospitalist notes, last 24 h vitals and pain scores, last 48 h intake and output, last 24 h labs and trends, and last 24 h imaging results.    LOS: 2 days    Franne Forts, [facility] 03/24/2023, 11:49 AM Please see Amion for pager number during day hours 7:00am-4:30pm

## 2023-04-27 ENCOUNTER — Inpatient Hospital Stay: Payer: Medicare Other | Attending: Oncology | Admitting: Oncology

## 2023-04-27 ENCOUNTER — Encounter: Payer: Self-pay | Admitting: Oncology

## 2023-04-27 ENCOUNTER — Inpatient Hospital Stay: Payer: Medicare Other

## 2023-04-27 ENCOUNTER — Ambulatory Visit (HOSPITAL_BASED_OUTPATIENT_CLINIC_OR_DEPARTMENT_OTHER)
Admission: RE | Admit: 2023-04-27 | Discharge: 2023-04-27 | Disposition: A | Discharge status: Home / Self Care | Payer: Medicare Other | Source: Ambulatory Visit | Attending: Oncology | Admitting: Oncology

## 2023-04-27 VITALS — BP 160/68 | HR 70 | Temp 98.5°F | Resp 16 | Ht 61.5 in | Wt 167.6 lb

## 2023-04-27 DIAGNOSIS — D472 Monoclonal gammopathy: Secondary | ICD-10-CM

## 2023-04-27 DIAGNOSIS — D6852 Prothrombin gene mutation: Secondary | ICD-10-CM | POA: Diagnosis not present

## 2023-04-27 DIAGNOSIS — E785 Hyperlipidemia, unspecified: Secondary | ICD-10-CM | POA: Insufficient documentation

## 2023-04-27 DIAGNOSIS — Z7901 Long term (current) use of anticoagulants: Secondary | ICD-10-CM | POA: Insufficient documentation

## 2023-04-27 DIAGNOSIS — R803 Bence Jones proteinuria: Secondary | ICD-10-CM | POA: Diagnosis not present

## 2023-04-27 DIAGNOSIS — I10 Essential (primary) hypertension: Secondary | ICD-10-CM

## 2023-04-27 DIAGNOSIS — I480 Paroxysmal atrial fibrillation: Secondary | ICD-10-CM | POA: Diagnosis not present

## 2023-04-27 DIAGNOSIS — I071 Rheumatic tricuspid insufficiency: Secondary | ICD-10-CM | POA: Insufficient documentation

## 2023-04-27 DIAGNOSIS — D6851 Activated protein C resistance: Secondary | ICD-10-CM

## 2023-04-27 DIAGNOSIS — R001 Bradycardia, unspecified: Secondary | ICD-10-CM | POA: Insufficient documentation

## 2023-04-27 DIAGNOSIS — N184 Chronic kidney disease, stage 4 (severe): Secondary | ICD-10-CM | POA: Insufficient documentation

## 2023-04-27 DIAGNOSIS — I272 Pulmonary hypertension, unspecified: Secondary | ICD-10-CM | POA: Insufficient documentation

## 2023-04-27 DIAGNOSIS — Z8673 Personal history of transient ischemic attack (TIA), and cerebral infarction without residual deficits: Secondary | ICD-10-CM | POA: Insufficient documentation

## 2023-04-27 DIAGNOSIS — I129 Hypertensive chronic kidney disease with stage 1 through stage 4 chronic kidney disease, or unspecified chronic kidney disease: Secondary | ICD-10-CM | POA: Diagnosis not present

## 2023-04-27 LAB — COMPREHENSIVE METABOLIC PANEL
ALT: 7 U/L (ref 0–44)
AST: 25 U/L (ref 15–41)
Albumin: 4.6 g/dL (ref 3.5–5.0)
Alkaline Phosphatase: 81 U/L (ref 38–126)
Anion gap: 10 (ref 5–15)
BUN: 22 mg/dL (ref 8–23)
CO2: 27 mmol/L (ref 22–32)
Calcium: 9.9 mg/dL (ref 8.9–10.3)
Chloride: 104 mmol/L (ref 98–111)
Creatinine, Ser: 1.79 mg/dL — ABNORMAL HIGH (ref 0.44–1.00)
GFR, Estimated: 28 mL/min — ABNORMAL LOW (ref >60)
Glucose, Bld: 104 mg/dL — ABNORMAL HIGH (ref 70–99)
Potassium: 3.8 mmol/L (ref 3.5–5.1)
Sodium: 141 mmol/L (ref 135–145)
Total Bilirubin: 0.5 mg/dL (ref 0.0–1.2)
Total Protein: 8.1 g/dL (ref 6.5–8.1)

## 2023-04-27 LAB — CBC WITH DIFFERENTIAL/PLATELET
Abs Immature Granulocytes: 0.02 10*3/uL (ref 0.00–0.07)
Basophils Absolute: 0 10*3/uL (ref 0.0–0.1)
Basophils Relative: 1 %
Eosinophils Absolute: 0.1 10*3/uL (ref 0.0–0.5)
Eosinophils Relative: 1 %
HCT: 35.6 % — ABNORMAL LOW (ref 36.0–46.0)
Hemoglobin: 11.4 g/dL — ABNORMAL LOW (ref 12.0–15.0)
Immature Granulocytes: 0 %
Lymphocytes Relative: 23 %
Lymphs Abs: 1.7 10*3/uL (ref 0.7–4.0)
MCH: 28.2 pg (ref 26.0–34.0)
MCHC: 32 g/dL (ref 30.0–36.0)
MCV: 88.1 fL (ref 80.0–100.0)
Monocytes Absolute: 0.4 10*3/uL (ref 0.1–1.0)
Monocytes Relative: 5 %
Neutro Abs: 5.2 10*3/uL (ref 1.7–7.7)
Neutrophils Relative %: 70 %
Platelets: 271 10*3/uL (ref 150–400)
RBC: 4.04 MIL/uL (ref 3.87–5.11)
RDW: 15.6 % — ABNORMAL HIGH (ref 11.5–15.5)
WBC: 7.4 10*3/uL (ref 4.0–10.5)
nRBC: 0 % (ref 0.0–0.2)

## 2023-04-27 LAB — URIC ACID: Uric Acid, Serum: 7.5 mg/dL — ABNORMAL HIGH (ref 2.5–7.1)

## 2023-04-27 LAB — LACTATE DEHYDROGENASE: LDH: 195 U/L — ABNORMAL HIGH (ref 98–192)

## 2023-04-27 LAB — BRAIN NATRIURETIC PEPTIDE: B Natriuretic Peptide: 1260 pg/mL — ABNORMAL HIGH (ref 0.0–100.0)

## 2023-04-27 NOTE — Assessment & Plan Note (Addendum)
On 02/25/2023, labs showed creatinine of 2.11, calcium of 9.7, total protein 7.6 g/dL, albumin 4.6 g/dL.  Hemoglobin 11.6, MCV 86.  White count and platelet count were normal.  SPEP showed 0.9 g/dL of M spike.  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  Free kappa was 50.5 mg/L, lambda was 34.4 mg/L, ratio normal at 1.47.  Quantitative IgG was slightly elevated at 1639 mg/dL when the cutoff was 6578.  Quantitative IgA and IgM are within normal limits.  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   -We will pursue additional workup to further evaluate monoclonal gammopathy.  Today we repeated SPEP, IFE, quantitative immunoglobulins, serum free light chains.  We will also obtain 24-hour urine protein electrophoresis, immunoelectrophoresis, free light chains for further evaluation.  Also request placed for x-ray skeletal survey to look for any bone lytic lesions.  -Depending on above results, we will determine need for bone marrow biopsy/aspiration.  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  Whether she would need treatment initiation or not will depend on above workup results.  At the minimum she needs close surveillance with repeat labs every 3 months.  -Plan for a follow-up phone call visit in 3 weeks to discuss results.

## 2023-04-27 NOTE — Assessment & Plan Note (Signed)
-  Noted on workup for CVA in 2019.  She has been on chronic anticoagulation with Eliquis given her paroxysmal A-fib and history of CVA.  She will need indefinite anticoagulation.

## 2023-04-27 NOTE — Assessment & Plan Note (Signed)
History of hypertension, but currently off antihypertensive medications due to low heart rate. Blood pressure is well-controlled at home, but increases in medical settings (white coat hypertension). -Continue monitoring blood pressure at home.

## 2023-04-27 NOTE — Progress Notes (Signed)
[facility]  HEMATOLOGY CLINIC CONSULTATION NOTE    PATIENT NAME: Jasmine Dawson   MR#: [id number] DOB: 1945/09/21  DATE OF SERVICE: 04/27/2023  REFERRING PHYSICIAN  Terrial Rhodes, MD, Nephrology   Patient Care Team: Leilani Able, MD as PCP - General (Family Medicine) Corky Crafts, MD as PCP - Cardiology (Cardiology) Regan Lemming, MD as PCP - Electrophysiology (Cardiology) Terrial Rhodes, MD as Consulting Physician (Nephrology)   REASON FOR CONSULTATION/ CHIEF COMPLAINT:  Evaluation of monoclonal gammopathy  ASSESSMENT & PLAN:  Jasmine Dawson is a 77 y.o. lady with a past medical history of CKD stage IV, heterozygous factor V Leiden mutation, prothrombin gene mutation, hyperlipidemia, hypertension, paroxysmal A-fib on Eliquis, pulmonary hypertension, sinus bradycardia, Hx. of stroke, DVT, SVT, and tricuspid regurgitation was referred to our service for evaluation of monoclonal gammopathy.   Monoclonal gammopathy On 02/25/2023, labs showed creatinine of 2.11, calcium of 9.7, total protein 7.6 g/dL, albumin 4.6 g/dL.  Hemoglobin 11.6, MCV 86.  White count and platelet count were normal.  SPEP showed 0.9 g/dL of M spike.  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  Free kappa was 50.5 mg/L, lambda was 34.4 mg/L, ratio normal at 1.47.  Quantitative IgG was slightly elevated at 1639 mg/dL when the cutoff was 5621.  Quantitative IgA and IgM are within normal limits.  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   -We will pursue additional workup to further evaluate monoclonal gammopathy.  Today we repeated SPEP, IFE, quantitative immunoglobulins, serum free light chains.  We will also obtain 24-hour urine protein electrophoresis, immunoelectrophoresis, free light chains for further evaluation.  Also request placed for x-ray skeletal survey to look  for any bone lytic lesions.  -Depending on above results, we will determine need for bone marrow biopsy/aspiration.  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  Whether she would need treatment initiation or not will depend on above workup results.  At the minimum she needs close surveillance with repeat labs every 3 months.  -Plan for a follow-up phone call visit in 3 weeks to discuss results.  Essential hypertension History of hypertension, but currently off antihypertensive medications due to low heart rate. Blood pressure is well-controlled at home, but increases in medical settings (white coat hypertension). -Continue monitoring blood pressure at home.  Heterozygous factor V Leiden mutation (HCC) -Noted on workup for CVA in 2019.  She has been on chronic anticoagulation with Eliquis given her paroxysmal A-fib and history of CVA.  She will need indefinite anticoagulation.  Prothrombin gene mutation (HCC) -Noted on workup for CVA in 2019.  She has been on chronic anticoagulation with Eliquis given her paroxysmal A-fib and history of CVA.  She will need indefinite anticoagulation.  I reviewed lab results and outside records for this visit and discussed relevant results with the patient. Diagnosis, plan of care and treatment options were also discussed in detail with the patient. Opportunity provided to ask questions and answers provided to her apparent satisfaction. Provided instructions to call our clinic with any problems, questions or concerns prior to return visit. I recommended to continue follow-up with PCP and sub-specialists. She verbalized understanding and agreed with the plan. No barriers to learning was detected.  Meryl Crutch, MD  04/27/2023 4:13 PM  [facility] - A DEPT OF [place]. [facility] [phone]  [address] [facility] [id number] Dept:  [phone] Dept Fax: [phone]   HISTORY OF PRESENT ILLNESS:  Discussed the use of AI scribe software for clinical note transcription with the patient, who gave verbal consent to proceed.   She has had regular follow-up with her nephrologist for her history of CKD.  Creatinine has been slowly worsening recently.  On 02/25/2023, labs showed creatinine of 2.11, calcium of 9.7, total protein 7.6 g/dL, albumin 4.6 g/dL.  Hemoglobin 11.6, MCV 86.  White count and platelet count were normal.  SPEP showed 0.9 g/dL of M spike.  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  Free kappa was 50.5 mg/L, lambda was 34.4 mg/L, ratio normal at 1.47.  Quantitative IgG was slightly elevated at 1639 mg/dL when the cutoff was 6010.  Quantitative IgA and IgM are within normal limits.  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  With these findings, she was referred to [place] for evaluation of monoclonal gammopathy.  The patient reports that her kidney function has been satisfactory, with no issues related to urination. She was recently hospitalized for abdominal pain, initially suspected to be gallbladder-related, but this was later ruled out. The patient's hypertension is currently not being managed with medication due to a previous incident where the medication caused her heart rate to drop too low. However, she reports that her blood pressure is generally well-controlled at home. The patient also has a history of a blood clot that led to a minor stroke. She is currently on Eliquis, a blood thinner, to prevent future clots. The patient also takes famotidine. She reports no current symptoms of dizziness, lightheadedness, chest pain, or trouble breathing. She maintains an active lifestyle, frequently climbing stairs at home without difficulty.   MEDICAL HISTORY Past Medical History:  Diagnosis Date   CKD (chronic kidney  disease), stage IV (HCC)    Heterozygous factor V Leiden mutation (HCC)    Hyperlipidemia    Hypertension    Hypertensive urgency    Motion sickness    PAF (paroxysmal atrial fibrillation) (HCC)    Preop cardiovascular exam 03/23/2023   Prothrombin gene mutation (HCC)    Pulmonary hypertension (HCC)    a. 2D echo 11/01/17 showed EF 65-70%, grade 1 DD, mod-severe TR, moderately increased PASP . b. F/u Echo 07/2018 not seen.   PVC's (premature ventricular contractions)    occasional PVCs   Sinus bradycardia    a. requiring reduction in beta blocker in 2020.   Stroke (cerebrum) (HCC)    SVT (supraventricular tachycardia) (HCC)    Tricuspid regurgitation      SURGICAL HISTORY Past Surgical History:  Procedure Laterality Date   HYSTEROSCOPY WITH D & C  11/07/2011   Procedure: DILATATION AND CURETTAGE /HYSTEROSCOPY;  Surgeon: Antionette Char, MD;  Location: [facility];  Service: Gynecology;  Laterality: N/A;  endometrial polypectomy   LOOP RECORDER INSERTION N/A 07/30/2018   Procedure: LOOP RECORDER INSERTION;  Surgeon: Regan Lemming, MD;  Location: [facility] INVASIVE [facility];  Service: Cardiovascular;  Laterality: N/A;   TUBAL LIGATION  1977     SOCIAL HISTORY: She reports that she has never smoked. She has never used smokeless tobacco. She reports that she does not drink alcohol and does not use drugs. Social History   Socioeconomic History   Marital status: Married    Spouse name: Not on file   Number of children: Not on file   Years of education: Not  on file   Highest education level: Not on file  Occupational History   Not on file  Tobacco Use   Smoking status: Never   Smokeless tobacco: Never  Vaping Use   Vaping status: Never Used  Substance and Sexual Activity   Alcohol use: No   Drug use: No   Sexual activity: Not Currently  Other Topics Concern   Not on file  Social History Narrative   Did daycare as a younger lady.  Retired at [employer]  [phone]             Social Drivers of Corporate investment banker Strain: Not on file  Food Insecurity: No Food Insecurity (03/22/2023)   Hunger Vital Sign    Worried About Running Out of Food in the Last Year: Never true    Ran Out of Food in the Last Year: Never true  Transportation Needs: No Transportation Needs (04/27/2023)   PRAPARE - Administrator, Civil Service (Medical): No    Lack of Transportation (Non-Medical): No  Physical Activity: Not on file  Stress: Not on file  Social Connections: Not on file  Intimate Partner Violence: Not At Risk (04/27/2023)   Humiliation, Afraid, Rape, and Kick questionnaire    Fear of Current or Ex-Partner: No    Emotionally Abused: No    Physically Abused: No    Sexually Abused: No    FAMILY HISTORY: Her family history includes Diabetes in her mother; Heart attack in her father and mother.  CURRENT MEDICATIONS   Current Outpatient Medications  Medication Instructions   Calcium Carbonate-Vitamin D (CALCIUM-VITAMIN D) 600-3.125 MG-MCG TABS 1 tablet, Daily   carboxymethylcellulose (REFRESH PLUS) 0.5 % SOLN 1 drop, Daily PRN   Chlorphen-PE-Acetaminophen (CORICIDIN D COLD/FLU/SINUS PO) 2 tablets, As needed   ELIQUIS 5 MG TABS tablet TAKE 1 TABLET(5 MG) BY MOUTH TWICE DAILY   famotidine (PEPCID) 20 mg, 2 times daily   fexofenadine-pseudoephedrine (ALLEGRA-D 24) 180-240 MG 24 hr tablet 1 tablet, Daily PRN     ALLERGIES  She is allergic to cefdinir, egg-derived products, orange fruit [citrus], prednisone, sulfa antibiotics, tomato, atorvastatin, influenza vaccines, aspartame and phenylalanine, caffeine, and shellfish allergy.  REVIEW OF SYSTEMS:  Review of Systems - Oncology   Rest of the pertinent review of systems is unremarkable except as mentioned above in HPI.  PHYSICAL EXAMINATION:  ECOG PERFORMANCE STATUS: 0 - Asymptomatic  Vitals:   04/27/23 1408 04/27/23 1415  BP: (!) 178/65 (!) 160/68  Pulse: 70    Resp: 16   Temp: 98.5 F (36.9 C)   SpO2: 100%    Filed Weights   04/27/23 1408  Weight: 167 lb 9.6 oz (76 kg)    Physical Exam Constitutional:      General: She is not in acute distress.    Appearance: Normal appearance.  HENT:     Head: Normocephalic and atraumatic.  Eyes:     General: No scleral icterus.    Conjunctiva/sclera: Conjunctivae normal.  Cardiovascular:     Rate and Rhythm: Normal rate and regular rhythm.     Heart sounds: Normal heart sounds.  Pulmonary:     Effort: Pulmonary effort is normal.     Breath sounds: Normal breath sounds.  Abdominal:     General: There is no distension.  Musculoskeletal:     Right lower leg: No edema.     Left lower leg: No edema.  Neurological:     General: No focal deficit present.  Mental Status: She is alert and oriented to person, place, and time.  Psychiatric:        Mood and Affect: Mood normal.        Behavior: Behavior normal.        Thought Content: Thought content normal.     LABORATORY DATA:   I have reviewed the data as listed.  Results for orders placed or performed in visit on 04/27/23  CBC with Differential/Platelet  Result Value Ref Range   WBC 7.4 4.0 - 10.5 K/uL   RBC 4.04 3.87 - 5.11 MIL/uL   Hemoglobin 11.4 (L) 12.0 - 15.0 g/dL   HCT 78.2 (L) 95.6 - 21.3 %   MCV 88.1 80.0 - 100.0 fL   MCH 28.2 26.0 - 34.0 pg   MCHC 32.0 30.0 - 36.0 g/dL   RDW 08.6 (H) 57.8 - 46.9 %   Platelets 271 150 - 400 K/uL   nRBC 0.0 0.0 - 0.2 %   Neutrophils Relative % 70 %   Neutro Abs 5.2 1.7 - 7.7 K/uL   Lymphocytes Relative 23 %   Lymphs Abs 1.7 0.7 - 4.0 K/uL   Monocytes Relative 5 %   Monocytes Absolute 0.4 0.1 - 1.0 K/uL   Eosinophils Relative 1 %   Eosinophils Absolute 0.1 0.0 - 0.5 K/uL   Basophils Relative 1 %   Basophils Absolute 0.0 0.0 - 0.1 K/uL   Immature Granulocytes 0 %   Abs Immature Granulocytes 0.02 0.00 - 0.07 K/uL    Recent Labs    03/22/23 0218 03/23/23 0305 03/24/23 0304  NA  131* 131* 135  K 3.8 3.4* 3.9  CL 95* 99 101  CO2 23 25 24   GLUCOSE 97 89 104*  BUN 41* 31* 26*  CREATININE 1.97* 2.38* 2.60*  CALCIUM 9.4 8.6* 9.5  GFRNONAA 25* 20* 18*  PROT 7.8 6.6 7.6  ALBUMIN 4.3 3.4* 3.8  AST 24 23 28   ALT 6 9 10   ALKPHOS 64 54 59  BILITOT 0.4 0.6 1.0     RADIOGRAPHIC STUDIES:  No pertinent imaging studies available to review.  Orders Placed This Encounter  Procedures   DG Bone Survey Met    Standing Status:   Future    Number of Occurrences:   1    Expiration Date:   04/26/2024    Reason for Exam (SYMPTOM  OR DIAGNOSIS REQUIRED):   Suspected multiple myeloma.  Please look for bone lytic lesions.    Preferred imaging location?:   [facility]   CBC with Differential/Platelet    Standing Status:   Future    Number of Occurrences:   1    Expiration Date:   04/26/2024   Comprehensive metabolic panel    Standing Status:   Future    Number of Occurrences:   1    Expiration Date:   04/26/2024   Lactate dehydrogenase    Standing Status:   Future    Number of Occurrences:   1    Expiration Date:   04/26/2024   Beta 2 microglobulin, serum    Standing Status:   Future    Number of Occurrences:   1    Expiration Date:   04/26/2024   Multiple Myeloma Panel (SPEP&IFE w/QIG)    Standing Status:   Future    Number of Occurrences:   1    Expiration Date:   04/26/2024   Kappa/lambda light chains    Standing Status:   Future  Number of Occurrences:   1    Expiration Date:   04/26/2024   UPEP/UIFE/Light Chains/TP, 24-Hr Ur    Standing Status:   Future    Expiration Date:   04/26/2024   Uric acid    Standing Status:   Future    Number of Occurrences:   1    Expiration Date:   04/26/2024   Brain natriuretic peptide    Standing Status:   Future    Number of Occurrences:   1    Expiration Date:   04/26/2024    Future Appointments  Date Time Provider Department Center  05/18/2023  3:00 PM Yzabella Crunk, Archie Patten, MD [facility] None  07/27/2023 11:00  AM [facility] PHLEBOTOMIST [facility] None  07/27/2023 11:20 AM Yaretzy Olazabal, MD [facility] None    I spent a total of 55 minutes during this encounter with the patient including review of chart and various tests results, discussions about plan of care and coordination of care plan.  This document was completed utilizing speech recognition software. Grammatical errors, random word insertions, pronoun errors, and incomplete sentences are an occasional consequence of this system due to software limitations, ambient noise, and hardware issues. Any formal questions or concerns about the content, text or information contained within the body of this dictation should be directly addressed to the provider for clarification.

## 2023-04-28 LAB — BETA 2 MICROGLOBULIN, SERUM: Beta-2 Microglobulin: 4.3 mg/L — ABNORMAL HIGH (ref 0.6–2.4)

## 2023-04-28 LAB — KAPPA/LAMBDA LIGHT CHAINS
Kappa free light chain: 44.9 mg/L — ABNORMAL HIGH (ref 3.3–19.4)
Kappa, lambda light chain ratio: 1.4 (ref 0.26–1.65)
Lambda free light chains: 32 mg/L — ABNORMAL HIGH (ref 5.7–26.3)

## 2023-04-30 ENCOUNTER — Inpatient Hospital Stay: Payer: Medicare Other | Attending: Oncology

## 2023-04-30 DIAGNOSIS — N184 Chronic kidney disease, stage 4 (severe): Secondary | ICD-10-CM | POA: Insufficient documentation

## 2023-04-30 DIAGNOSIS — D6852 Prothrombin gene mutation: Secondary | ICD-10-CM | POA: Insufficient documentation

## 2023-04-30 DIAGNOSIS — Z7901 Long term (current) use of anticoagulants: Secondary | ICD-10-CM | POA: Diagnosis not present

## 2023-04-30 DIAGNOSIS — D6851 Activated protein C resistance: Secondary | ICD-10-CM | POA: Diagnosis not present

## 2023-04-30 DIAGNOSIS — R768 Other specified abnormal immunological findings in serum: Secondary | ICD-10-CM | POA: Insufficient documentation

## 2023-04-30 DIAGNOSIS — D472 Monoclonal gammopathy: Secondary | ICD-10-CM | POA: Diagnosis present

## 2023-04-30 DIAGNOSIS — I129 Hypertensive chronic kidney disease with stage 1 through stage 4 chronic kidney disease, or unspecified chronic kidney disease: Secondary | ICD-10-CM | POA: Diagnosis not present

## 2023-04-30 DIAGNOSIS — Z8673 Personal history of transient ischemic attack (TIA), and cerebral infarction without residual deficits: Secondary | ICD-10-CM | POA: Insufficient documentation

## 2023-04-30 DIAGNOSIS — I480 Paroxysmal atrial fibrillation: Secondary | ICD-10-CM | POA: Insufficient documentation

## 2023-05-01 ENCOUNTER — Other Ambulatory Visit: Payer: Self-pay | Admitting: *Deleted

## 2023-05-01 DIAGNOSIS — D472 Monoclonal gammopathy: Secondary | ICD-10-CM

## 2023-05-05 LAB — MULTIPLE MYELOMA PANEL, SERUM
Albumin SerPl Elph-Mcnc: 4 g/dL (ref 2.9–4.4)
Albumin/Glob SerPl: 1.2 (ref 0.7–1.7)
Alpha 1: 0.2 g/dL (ref 0.0–0.4)
Alpha2 Glob SerPl Elph-Mcnc: 0.7 g/dL (ref 0.4–1.0)
B-Globulin SerPl Elph-Mcnc: 1 g/dL (ref 0.7–1.3)
Gamma Glob SerPl Elph-Mcnc: 1.5 g/dL (ref 0.4–1.8)
Globulin, Total: 3.4 g/dL (ref 2.2–3.9)
IgA: 197 mg/dL (ref 64–422)
IgG (Immunoglobin G), Serum: 1642 mg/dL — ABNORMAL HIGH (ref 586–1602)
IgM (Immunoglobulin M), Srm: 96 mg/dL (ref 26–217)
M Protein SerPl Elph-Mcnc: 0.7 g/dL — ABNORMAL HIGH
Total Protein ELP: 7.4 g/dL (ref 6.0–8.5)

## 2023-05-06 LAB — UPEP/UIFE/LIGHT CHAINS/TP, 24-HR UR
% BETA, Urine: 22.9 %
ALPHA 1 URINE: 7.6 %
Albumin, U: 32.9 %
Alpha 2, Urine: 13.8 %
Free Kappa Lt Chains,Ur: 23.97 mg/L (ref 1.17–86.46)
Free Kappa/Lambda Ratio: 3.5 (ref 1.83–14.26)
Free Lambda Lt Chains,Ur: 6.84 mg/L (ref 0.27–15.21)
GAMMA GLOBULIN URINE: 22.7 %
M-SPIKE %, Urine: 9.3 % — ABNORMAL HIGH
M-Spike, Mg/24 Hr: 4 mg/(24.h) — ABNORMAL HIGH
Total Protein, Urine-Ur/day: 46 mg/(24.h) (ref 30–150)
Total Protein, Urine: 4.6 mg/dL
Total Volume: 1000

## 2023-05-18 ENCOUNTER — Inpatient Hospital Stay (HOSPITAL_BASED_OUTPATIENT_CLINIC_OR_DEPARTMENT_OTHER): Payer: Medicare Other | Admitting: Oncology

## 2023-05-18 ENCOUNTER — Encounter: Payer: Self-pay | Admitting: Oncology

## 2023-05-18 DIAGNOSIS — D6851 Activated protein C resistance: Secondary | ICD-10-CM | POA: Diagnosis not present

## 2023-05-18 DIAGNOSIS — D472 Monoclonal gammopathy: Secondary | ICD-10-CM

## 2023-05-18 DIAGNOSIS — I10 Essential (primary) hypertension: Secondary | ICD-10-CM

## 2023-05-18 DIAGNOSIS — D6852 Prothrombin gene mutation: Secondary | ICD-10-CM | POA: Diagnosis not present

## 2023-05-18 NOTE — Progress Notes (Signed)
[facility]  HEMATOLOGY-ONCOLOGY ELECTRONIC VISIT PROGRESS NOTE  PATIENT NAME: Jasmine Dawson   MR#: [id number] DOB: 1946-04-02  DATE OF SERVICE: 05/18/2023  Patient Care Team: Leilani Able, MD as PCP - General (Family Medicine) Corky Crafts, MD as PCP - Cardiology (Cardiology) Regan Lemming, MD as PCP - Electrophysiology (Cardiology) Terrial Rhodes, MD as Consulting Physician (Nephrology)  I connected with the patient via telephone conference and verified that I am speaking with the correct person using two identifiers. The patient's location is at home and I am providing care from the [facility].  I discussed the limitations, risks, security and privacy concerns of performing an evaluation and management service by e-visits and the availability of in person appointments.  I also discussed with the patient that there may be a patient responsible charge related to this service. The patient expressed understanding and agreed to proceed.   ASSESSMENT & PLAN:   Jasmine Dawson is a 78 y.o. lady with a past medical history of CKD stage IV, heterozygous factor V Leiden mutation, prothrombin gene mutation, hyperlipidemia, hypertension, paroxysmal A-fib on Eliquis, pulmonary hypertension, sinus bradycardia, Hx. of stroke, DVT, SVT, and tricuspid regurgitation was referred to our service in December 2024 for evaluation of monoclonal gammopathy.   Monoclonal gammopathy of unknown significance On 02/25/2023, labs showed creatinine of 2.11, calcium of 9.7, total protein 7.6 g/dL, albumin 4.6 g/dL.  Hemoglobin 11.6, MCV 86.  White count and platelet count were normal.  SPEP showed 0.9 g/dL of M spike.  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  Free kappa was 50.5 mg/L, lambda was 34.4 mg/L, ratio normal at 1.47.  Quantitative IgG was slightly elevated at 1639 mg/dL when the cutoff was 4010.  Quantitative IgA and IgM are within normal limits.  Random urine  immunoelectrophoresis showed IgG monoclonal protein with lambda light chain specificity, Bence-Jones protein positive, lambda type.   On her initial consultation with [place] on 04/27/2023, we pursued additional workup.  Hemoglobin was close to normal at 11.4.  White count and platelet count were within normal limits.  Creatinine improved at 1.79, calcium normal at 10.9, otherwise unremarkable CMP.  SPEP showed 0.7 g/dL of M protein.  IFE showed to be IgG lambda type.  Quantitative immunoglobulins were grossly within normal limits except for mildly elevated IgG of 1642 mg/dL.  Both kappa and lambda light chains were elevated (44.9 mg/L and 32 mg/L respectively), consistent with CKD picture, ratio was normal at 1.4.  Beta-2 microglobulin was elevated at 4.3 mg/L.  LDH normal.  24-hour urine protein electrophoresis showed 4.6 mg/dL of total protein, out of which 9.3% was M protein, amounting to 4 mg over 24 hours.  Small amount.  IFE on urine showed it to be IgG lambda protein.  X-ray skeletal survey on 04/27/2023 showed no lytic lesions.  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 She needs close surveillance with repeat labs every 3 months initially and if stable labs, we will space out clinic intervals.   Prothrombin gene mutation (HCC) -Noted on workup for CVA in 2019.  She has been on chronic anticoagulation with Eliquis given her paroxysmal A-fib and history of CVA.  She will need indefinite anticoagulation.  Heterozygous factor V Leiden mutation (HCC) -Noted on workup for CVA in 2019.  She has been on chronic anticoagulation with Eliquis given her paroxysmal A-fib and history of CVA.  She will need indefinite anticoagulation.  Essential hypertension History of hypertension, but currently off antihypertensive  medications due to low heart rate. Blood pressure is well-controlled at home, but increases in medical settings (white coat hypertension). -Continue monitoring blood pressure at home.  RTC in  March as scheduled with labs 2 weeks prior to return visit.  Orders Placed This Encounter  Procedures   Comprehensive metabolic panel    Standing Status:   Future    Expected Date:   07/13/2023    Expiration Date:   05/17/2024   CBC with Differential/Platelet    Standing Status:   Future    Expected Date:   07/13/2023    Expiration Date:   05/17/2024   Multiple Myeloma Panel (SPEP&IFE w/QIG)    Standing Status:   Future    Expected Date:   07/13/2023    Expiration Date:   05/17/2024   Kappa/lambda light chains    Standing Status:   Future    Expected Date:   07/13/2023    Expiration Date:   05/17/2024   Uric acid    Standing Status:   Future    Expected Date:   07/13/2023    Expiration Date:   05/17/2024    INTERVAL HISTORY:  Please see above for problem oriented charting.  The purpose of today's discussion is to explain recent lab results and to formulate plan of care.  She reports no new complaints compared to last visit.  Overall doing well and is at her baseline health status.  SUMMARY OF HEMATOLOGY HISTORY:  She has had regular follow-up with her nephrologist for her history of CKD.  Creatinine has been slowly worsening recently.   On 02/25/2023, labs showed creatinine of 2.11, calcium of 9.7, total protein 7.6 g/dL, albumin 4.6 g/dL.  Hemoglobin 11.6, MCV 86.  White count and platelet count were normal.  SPEP showed 0.9 g/dL of M spike.  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  Free kappa was 50.5 mg/L, lambda was 34.4 mg/L, ratio normal at 1.47.  Quantitative IgG was slightly elevated at 1639 mg/dL when the cutoff was 9528.  Quantitative IgA and IgM are within normal limits.  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   With these findings, she was referred to [place] for evaluation of monoclonal gammopathy.   The patient reports that her kidney function has been satisfactory, with no  issues related to urination. She was recently hospitalized for abdominal pain, initially suspected to be gallbladder-related, but this was later ruled out. The patient's hypertension is currently not being managed with medication due to a previous incident where the medication caused her heart rate to drop too low. However, she reports that her blood pressure is generally well-controlled at home. The patient also has a history of a blood clot that led to a minor stroke. She is currently on Eliquis, a blood thinner, to prevent future clots.   On her initial consultation with [place] on 04/27/2023, we pursued additional workup.  Hemoglobin was close to normal at 11.4.  White count and platelet count were within normal limits.  Creatinine improved at 1.79, calcium normal at 10.9, otherwise unremarkable CMP.  SPEP showed 0.7 g/dL of M protein.  IFE showed to be IgG lambda type.  Quantitative immunoglobulins were grossly within normal limits except for mildly elevated IgG of 1642 mg/dL.  Both kappa and lambda light chains were elevated (44.9 mg/L and 32 mg/L respectively), consistent with CKD picture, ratio was normal at 1.4.  Beta-2 microglobulin was elevated at  4.3 mg/L.  LDH normal.  24-hour urine protein electrophoresis showed 4.6 mg/dL of total protein, out of which 9.3% was M protein, amounting to 4 mg over 24 hours.  Small amount.  IFE on urine showed it to be IgG lambda protein.  X-ray skeletal survey on 04/27/2023 showed no lytic lesions.  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 She needs close surveillance with repeat labs every 3 months initially and if stable labs, we will space out clinic intervals.   REVIEW OF SYSTEMS:    Review of Systems - Oncology  All other pertinent systems were reviewed with the patient and are negative.  I have reviewed the past medical history, past surgical history, social history and family history with the patient and they are unchanged from previous  note.  ALLERGIES:  She is allergic to cefdinir, egg-derived products, orange fruit [citrus], prednisone, sulfa antibiotics, tomato, atorvastatin, influenza vaccines, aspartame and phenylalanine, caffeine, and shellfish allergy.  MEDICATIONS:  Current Outpatient Medications  Medication Sig Dispense Refill   Calcium Carbonate-Vitamin D (CALCIUM-VITAMIN D) 600-3.125 MG-MCG TABS Take 1 tablet by mouth daily.     carboxymethylcellulose (REFRESH PLUS) 0.5 % SOLN Place 1 drop into both eyes daily as needed (for dry eyes). (Patient not taking: Reported on 04/27/2023)     Chlorphen-PE-Acetaminophen (CORICIDIN D COLD/FLU/SINUS PO) Take 2 tablets by mouth as needed (Allergies). (Patient not taking: Reported on 04/27/2023)     ELIQUIS 5 MG TABS tablet TAKE 1 TABLET(5 MG) BY MOUTH TWICE DAILY 60 tablet 5   famotidine (PEPCID) 20 MG tablet Take 20 mg by mouth 2 (two) times daily.     fexofenadine-pseudoephedrine (ALLEGRA-D 24) 180-240 MG 24 hr tablet Take 1 tablet by mouth daily as needed (allergies). (Patient not taking: Reported on 04/27/2023)     No current facility-administered medications for this visit.    PHYSICAL EXAMINATION: ECOG PERFORMANCE STATUS: 1 - Symptomatic but completely ambulatory  LABORATORY DATA:   I have reviewed the data as listed.  Recent Results (from the past 2160 hours)  Lab report - scanned     Status: None   Collection Time: 02/27/23  2:17 PM  Result Value Ref Range   EGFR 23.0     Comment: Abstracted by HIM  Comprehensive metabolic panel     Status: Abnormal   Collection Time: 03/05/23 10:31 AM  Result Value Ref Range   Glucose 93 70 - 99 mg/dL   BUN 42 (H) 8 - 27 mg/dL   Creatinine, Ser 1.61 (H) 0.57 - 1.00 mg/dL   eGFR 22 (L) >09 UE/AVW/0.98   BUN/Creatinine Ratio 19 12 - 28   Sodium 139 134 - 144 mmol/L   Potassium 4.4 3.5 - 5.2 mmol/L   Chloride 100 96 - 106 mmol/L   CO2 24 20 - 29 mmol/L   Calcium 9.9 8.7 - 10.3 mg/dL   Total Protein 7.5 6.0 - 8.5 g/dL    Albumin 4.5 3.8 - 4.8 g/dL   Globulin, Total 3.0 1.5 - 4.5 g/dL   Bilirubin Total 0.3 0.0 - 1.2 mg/dL   Alkaline Phosphatase 88 44 - 121 IU/L   AST 33 0 - 40 IU/L   ALT 10 0 - 32 IU/L  Lipid panel     Status: None   Collection Time: 03/05/23 10:31 AM  Result Value Ref Range   Cholesterol, Total 180 100 - 199 mg/dL   Triglycerides 119 0 - 149 mg/dL   HDL 85 >14 mg/dL   VLDL Cholesterol Cal 18 5 -  40 mg/dL   LDL Chol Calc (NIH) 77 0 - 99 mg/dL   Chol/HDL Ratio 2.1 0.0 - 4.4 ratio    Comment:                                   T. Chol/HDL Ratio                                             Men  Women                               1/2 Avg.Risk  3.4    3.3                                   Avg.Risk  5.0    4.4                                2X Avg.Risk  9.6    7.1                                3X Avg.Risk 23.4   11.0   CK     Status: Abnormal   Collection Time: 03/05/23 10:31 AM  Result Value Ref Range   Total CK 271 (H) 32 - 182 U/L  Magnesium     Status: None   Collection Time: 03/05/23 10:31 AM  Result Value Ref Range   Magnesium 2.2 1.6 - 2.3 mg/dL  Specimen status report     Status: None   Collection Time: 03/05/23 10:31 AM  Result Value Ref Range   specimen status report Comment     Comment: Written Authorization Written Authorization Written Authorization Received. Authorization received from [employer] 03-13-2023 Logged by Teodoro Kil   CBC with Differential     Status: Abnormal   Collection Time: 03/22/23  2:18 AM  Result Value Ref Range   WBC 6.6 4.0 - 10.5 K/uL   RBC 3.97 3.87 - 5.11 MIL/uL   Hemoglobin 11.3 (L) 12.0 - 15.0 g/dL   HCT 74.2 (L) 59.5 - 63.8 %   MCV 85.6 80.0 - 100.0 fL   MCH 28.5 26.0 - 34.0 pg   MCHC 33.2 30.0 - 36.0 g/dL   RDW 75.6 43.3 - 29.5 %   Platelets 237 150 - 400 K/uL   nRBC 0.0 0.0 - 0.2 %   Neutrophils Relative % 62 %   Neutro Abs 4.2 1.7 - 7.7 K/uL   Lymphocytes Relative 27 %   Lymphs Abs 1.8 0.7 - 4.0 K/uL   Monocytes  Relative 8 %   Monocytes Absolute 0.5 0.1 - 1.0 K/uL   Eosinophils Relative 2 %   Eosinophils Absolute 0.1 0.0 - 0.5 K/uL   Basophils Relative 1 %   Basophils Absolute 0.0 0.0 - 0.1 K/uL   Immature Granulocytes 0 %   Abs Immature Granulocytes 0.01 0.00 - 0.07 K/uL    Comment: Performed at [facility], [address], [place], [place] [address]  Comprehensive metabolic panel     Status: Abnormal  Collection Time: 03/22/23  2:18 AM  Result Value Ref Range   Sodium 131 (L) 135 - 145 mmol/L   Potassium 3.8 3.5 - 5.1 mmol/L   Chloride 95 (L) 98 - 111 mmol/L   CO2 23 22 - 32 mmol/L   Glucose, Bld 97 70 - 99 mg/dL    Comment: Glucose reference range applies only to samples taken after fasting for at least 8 hours.   BUN 41 (H) 8 - 23 mg/dL   Creatinine, Ser 0.98 (H) 0.44 - 1.00 mg/dL   Calcium 9.4 8.9 - 11.9 mg/dL   Total Protein 7.8 6.5 - 8.1 g/dL   Albumin 4.3 3.5 - 5.0 g/dL   AST 24 15 - 41 U/L   ALT 6 0 - 44 U/L   Alkaline Phosphatase 64 38 - 126 U/L   Total Bilirubin 0.4 <1.2 mg/dL   GFR, Estimated 25 (L) >60 mL/min    Comment: (NOTE) Calculated using the CKD-EPI Creatinine Equation (2021)    Anion gap 13 5 - 15    Comment: Performed at [facility], [address], [place], [place] [address]  Lipase, blood     Status: Abnormal   Collection Time: 03/22/23  2:18 AM  Result Value Ref Range   Lipase 162 (H) 11 - 51 U/L    Comment: Performed at [facility], [address] [place], [place] [address]  Troponin I (High Sensitivity)     Status: None   Collection Time: 03/22/23  2:18 AM  Result Value Ref Range   Troponin I (High Sensitivity) 6 <18 ng/L    Comment: (NOTE) Elevated high sensitivity troponin I (hsTnI) values and significant  changes across serial measurements may suggest ACS but many other  chronic and acute conditions are known to elevate hsTnI results.  Refer to the "Links" section for chest pain  algorithms and additional  guidance. Performed at [facility], [address] [place], [place] [address]   Urinalysis, Routine w reflex microscopic -Urine, Clean Catch     Status: Abnormal   Collection Time: 03/22/23  3:53 AM  Result Value Ref Range   Color, Urine COLORLESS (A) YELLOW   APPearance CLEAR CLEAR   Specific Gravity, Urine 1.007 1.005 - 1.030   pH 6.0 5.0 - 8.0   Glucose, UA NEGATIVE NEGATIVE mg/dL   Hgb urine dipstick NEGATIVE NEGATIVE   Bilirubin Urine NEGATIVE NEGATIVE   Ketones, ur NEGATIVE NEGATIVE mg/dL   Protein, ur NEGATIVE NEGATIVE mg/dL   Nitrite NEGATIVE NEGATIVE   Leukocytes,Ua NEGATIVE NEGATIVE    Comment: Performed at [facility], [address], [place], [place] [address]  Troponin I (High Sensitivity)     Status: None   Collection Time: 03/22/23  3:53 AM  Result Value Ref Range   Troponin I (High Sensitivity) 7 <18 ng/L    Comment: (NOTE) Elevated high sensitivity troponin I (hsTnI) values and significant  changes across serial measurements may suggest ACS but many other  chronic and acute conditions are known to elevate hsTnI results.  Refer to the "Links" section for chest pain algorithms and additional  guidance. Performed at [facility], [address], [place], [place] [address]   Heparin level (unfractionated)     Status: Abnormal   Collection Time: 03/22/23  7:57 PM  Result Value Ref Range   Heparin Unfractionated >1.10 (H) 0.30 - 0.70 IU/mL    Comment: (NOTE) The clinical reportable range upper limit is being lowered to >1.10 to  align with the FDA approved guidance for the current laboratory assay.  If heparin results are below expected values, and patient dosage has  been confirmed, suggest follow up testing of antithrombin III levels. Performed at [facility], [address] [place], [place] [address]   APTT     Status: Abnormal   Collection Time: 03/22/23   7:57 PM  Result Value Ref Range   aPTT 105 (H) 24 - 36 seconds    Comment:        IF BASELINE aPTT IS ELEVATED, SUGGEST PATIENT RISK ASSESSMENT BE USED TO DETERMINE APPROPRIATE ANTICOAGULANT THERAPY. Performed at [facility], [address], [place], [place] [address]   Comprehensive metabolic panel     Status: Abnormal   Collection Time: 03/23/23  3:05 AM  Result Value Ref Range   Sodium 131 (L) 135 - 145 mmol/L   Potassium 3.4 (L) 3.5 - 5.1 mmol/L   Chloride 99 98 - 111 mmol/L   CO2 25 22 - 32 mmol/L   Glucose, Bld 89 70 - 99 mg/dL    Comment: Glucose reference range applies only to samples taken after fasting for at least 8 hours.   BUN 31 (H) 8 - 23 mg/dL   Creatinine, Ser 0.98 (H) 0.44 - 1.00 mg/dL   Calcium 8.6 (L) 8.9 - 10.3 mg/dL   Total Protein 6.6 6.5 - 8.1 g/dL   Albumin 3.4 (L) 3.5 - 5.0 g/dL   AST 23 15 - 41 U/L   ALT 9 0 - 44 U/L   Alkaline Phosphatase 54 38 - 126 U/L   Total Bilirubin 0.6 <1.2 mg/dL   GFR, Estimated 20 (L) >60 mL/min    Comment: (NOTE) Calculated using the CKD-EPI Creatinine Equation (2021)    Anion gap 7 5 - 15    Comment: Performed at [facility], [address], [place], [place] [address]  CBC     Status: Abnormal   Collection Time: 03/23/23  3:05 AM  Result Value Ref Range   WBC 5.6 4.0 - 10.5 K/uL   RBC 3.69 (L) 3.87 - 5.11 MIL/uL   Hemoglobin 10.4 (L) 12.0 - 15.0 g/dL   HCT 78.2 (L) 95.6 - 21.3 %   MCV 85.9 80.0 - 100.0 fL   MCH 28.2 26.0 - 34.0 pg   MCHC 32.8 30.0 - 36.0 g/dL   RDW 08.6 57.8 - 46.9 %   Platelets 223 150 - 400 K/uL   nRBC 0.0 0.0 - 0.2 %    Comment: Performed at [facility], [address], [place], [place] [address]  Triglycerides     Status: None   Collection Time: 03/23/23  3:05 AM  Result Value Ref Range   Triglycerides 59 <150 mg/dL    Comment: Performed at [facility], [address], [place], [place] [address]  Heparin level (unfractionated)     Status: Abnormal   Collection  Time: 03/23/23  3:05 AM  Result Value Ref Range   Heparin Unfractionated >1.10 (H) 0.30 - 0.70 IU/mL    Comment: (NOTE) The clinical reportable range upper limit is being lowered to >1.10 to align with the FDA approved guidance for the current laboratory assay.  If heparin results are below expected values, and patient dosage has  been confirmed, suggest follow up testing of antithrombin III levels. Performed at [facility], [address], [place], [place] [address]   APTT     Status:  Abnormal   Collection Time: 03/23/23  3:05 AM  Result Value Ref Range   aPTT 116 (H) 24 - 36 seconds    Comment:        IF BASELINE aPTT IS ELEVATED, SUGGEST PATIENT RISK ASSESSMENT BE USED TO DETERMINE APPROPRIATE ANTICOAGULANT THERAPY. Performed at [facility], [address], [place], [place] [address]   Lipid panel     Status: Abnormal   Collection Time: 03/23/23  3:07 AM  Result Value Ref Range   Cholesterol 190 0 - 200 mg/dL   Triglycerides 56 <914 mg/dL   HDL 67 >78 mg/dL   Total CHOL/HDL Ratio 2.8 RATIO   VLDL 11 0 - 40 mg/dL   LDL Cholesterol 295 (H) 0 - 99 mg/dL    Comment:        Total Cholesterol/HDL:CHD Risk Coronary Heart Disease Risk Table                     Men   Women  1/2 Average Risk   3.4   3.3  Average Risk       5.0   4.4  2 X Average Risk   9.6   7.1  3 X Average Risk  23.4   11.0        Use the calculated Patient Ratio above and the CHD Risk Table to determine the patient's CHD Risk.        ATP III CLASSIFICATION (LDL):  <100     mg/dL   Optimal  621-308  mg/dL   Near or Above                    Optimal  130-159  mg/dL   Borderline  657-846  mg/dL   High  >962     mg/dL   Very High Performed at [facility], [address], [place], [place] [address]   APTT     Status: Abnormal   Collection Time: 03/23/23 12:55 PM  Result Value Ref Range   aPTT 78 (H) 24 - 36 seconds    Comment:        IF BASELINE aPTT IS ELEVATED, SUGGEST PATIENT  RISK ASSESSMENT BE USED TO DETERMINE APPROPRIATE ANTICOAGULANT THERAPY. Performed at [facility], [address] [place], [place] [address]   APTT     Status: Abnormal   Collection Time: 03/23/23 10:35 PM  Result Value Ref Range   aPTT 87 (H) 24 - 36 seconds    Comment:        IF BASELINE aPTT IS ELEVATED, SUGGEST PATIENT RISK ASSESSMENT BE USED TO DETERMINE APPROPRIATE ANTICOAGULANT THERAPY. Performed at [facility], [address], [place], [place] [address]   Heparin level (unfractionated)     Status: Abnormal   Collection Time: 03/24/23  3:04 AM  Result Value Ref Range   Heparin Unfractionated >1.10 (H) 0.30 - 0.70 IU/mL    Comment: (NOTE) The clinical reportable range upper limit is being lowered to >1.10 to align with the FDA approved guidance for the current laboratory assay.  If heparin results are below expected values, and patient dosage has  been confirmed, suggest follow up testing of antithrombin III levels. Performed at [facility], [address] [place], [place] [address]   APTT     Status: Abnormal   Collection Time: 03/24/23  3:04 AM  Result Value Ref Range   aPTT >200 (HH) 24 - 36 seconds  Comment:        IF BASELINE aPTT IS ELEVATED, SUGGEST PATIENT RISK ASSESSMENT BE USED TO DETERMINE APPROPRIATE ANTICOAGULANT THERAPY. REPEATED TO VERIFY CRITICAL RESULT CALLED TO, READ BACK BY AND VERIFIED WITH: Jennet Maduro, RN AT [phone] 11.26.24 JLASIGAN Performed at [facility], [address] [place], [place] [address]   CBC     Status: Abnormal   Collection Time: 03/24/23  3:04 AM  Result Value Ref Range   WBC 6.4 4.0 - 10.5 K/uL   RBC 4.02 3.87 - 5.11 MIL/uL   Hemoglobin 11.5 (L) 12.0 - 15.0 g/dL   HCT 01.0 (L) 93.2 - 35.5 %   MCV 86.6 80.0 - 100.0 fL   MCH 28.6 26.0 - 34.0 pg   MCHC 33.0 30.0 - 36.0 g/dL   RDW 73.2 20.2 - 54.2 %   Platelets 253 150 - 400 K/uL   nRBC 0.0 0.0 - 0.2 %    Comment: Performed at [facility], [address] [place], [place] [address]  Comprehensive metabolic panel     Status: Abnormal   Collection Time: 03/24/23  3:04 AM  Result Value Ref Range   Sodium 135 135 - 145 mmol/L   Potassium 3.9 3.5 - 5.1 mmol/L   Chloride 101 98 - 111 mmol/L   CO2 24 22 - 32 mmol/L   Glucose, Bld 104 (H) 70 - 99 mg/dL    Comment: Glucose reference range applies only to samples taken after fasting for at least 8 hours.   BUN 26 (H) 8 - 23 mg/dL   Creatinine, Ser 7.62 (H) 0.44 - 1.00 mg/dL   Calcium 9.5 8.9 - 83.1 mg/dL   Total Protein 7.6 6.5 - 8.1 g/dL   Albumin 3.8 3.5 - 5.0 g/dL   AST 28 15 - 41 U/L   ALT 10 0 - 44 U/L   Alkaline Phosphatase 59 38 - 126 U/L   Total Bilirubin 1.0 <1.2 mg/dL   GFR, Estimated 18 (L) >60 mL/min    Comment: (NOTE) Calculated using the CKD-EPI Creatinine Equation (2021)    Anion gap 10 5 - 15    Comment: Performed at [facility], [address] [place], [place] [address]  Magnesium     Status: None   Collection Time: 03/24/23  3:04 AM  Result Value Ref Range   Magnesium 2.2 1.7 - 2.4 mg/dL    Comment: Performed at [facility], [address] [place], [place] [address]  Phosphorus     Status: None   Collection Time: 03/24/23  3:04 AM  Result Value Ref Range   Phosphorus 3.3 2.5 - 4.6 mg/dL    Comment: Performed at [facility], [address], [place], [place] [address]  Brain natriuretic peptide     Status: Abnormal   Collection Time: 04/27/23  3:28 PM  Result Value Ref Range   B Natriuretic Peptide 1,260.0 (H) 0.0 - 100.0 pg/mL    Comment: Performed at [facility], [address] [place], [place] [address]  Uric acid     Status: Abnormal   Collection Time: 04/27/23  3:28 PM  Result Value Ref Range   Uric Acid, Serum 7.5 (H) 2.5 - 7.1 mg/dL    Comment: Performed at [facility], [address] [place], [place] [address]  Kappa/lambda light chains     Status: Abnormal    Collection Time: 04/27/23  3:28 PM  Result Value Ref Range  Kappa free light chain 44.9 (H) 3.3 - 19.4 mg/L   Lambda free light chains 32.0 (H) 5.7 - 26.3 mg/L   Kappa, lambda light chain ratio 1.40 0.26 - 1.65    Comment: (NOTE) Performed At: [facility] [address] [place], [place] [id number] Jolene Schimke MD NW:[id number]   Multiple Myeloma Panel (SPEP&IFE w/QIG)     Status: Abnormal   Collection Time: 04/27/23  3:28 PM  Result Value Ref Range   IgG (Immunoglobin G), Serum 1,642 (H) 586 - 1,602 mg/dL   IgA 578 64 - 469 mg/dL   IgM (Immunoglobulin M), Srm 96 26 - 217 mg/dL   Total Protein ELP 7.4 6.0 - 8.5 g/dL   Albumin SerPl Elph-Mcnc 4.0 2.9 - 4.4 g/dL   Alpha 1 0.2 0.0 - 0.4 g/dL   Alpha2 Glob SerPl Elph-Mcnc 0.7 0.4 - 1.0 g/dL   B-Globulin SerPl Elph-Mcnc 1.0 0.7 - 1.3 g/dL   Gamma Glob SerPl Elph-Mcnc 1.5 0.4 - 1.8 g/dL   M Protein SerPl Elph-Mcnc 0.7 (H) Not Observed g/dL   Globulin, Total 3.4 2.2 - 3.9 g/dL   Albumin/Glob SerPl 1.2 0.7 - 1.7   IFE 1 Comment (A)     Comment: (NOTE) Immunofixation shows IgG monoclonal protein with lambda light chain specificity.    Please Note Comment     Comment: (NOTE) Protein electrophoresis scan will follow via computer, mail, or courier delivery. Performed At: [facility] [address] [place], [place] [id number] Jolene Schimke MD KG:[id number]   Beta 2 microglobulin, serum     Status: Abnormal   Collection Time: 04/27/23  3:28 PM  Result Value Ref Range   Beta-2 Microglobulin 4.3 (H) 0.6 - 2.4 mg/L    Comment: (NOTE) Siemens Immulite 2000 Immunochemiluminometric assay (ICMA) Values obtained with different assay methods or kits cannot be used interchangeably. Results cannot be interpreted as absolute evidence of the presence or absence of malignant disease. Performed At: [facility] [address] [place], [place] [id number] Jolene Schimke MD VZ:[id number]   Lactate dehydrogenase      Status: Abnormal   Collection Time: 04/27/23  3:28 PM  Result Value Ref Range   LDH 195 (H) 98 - 192 U/L    Comment: Performed at [facility], [address] [place], [place] [address]  Comprehensive metabolic panel     Status: Abnormal   Collection Time: 04/27/23  3:28 PM  Result Value Ref Range   Sodium 141 135 - 145 mmol/L   Potassium 3.8 3.5 - 5.1 mmol/L   Chloride 104 98 - 111 mmol/L   CO2 27 22 - 32 mmol/L   Glucose, Bld 104 (H) 70 - 99 mg/dL    Comment: Glucose reference range applies only to samples taken after fasting for at least 8 hours.   BUN 22 8 - 23 mg/dL   Creatinine, Ser 8.41 (H) 0.44 - 1.00 mg/dL   Calcium 9.9 8.9 - 66.0 mg/dL   Total Protein 8.1 6.5 - 8.1 g/dL   Albumin 4.6 3.5 - 5.0 g/dL   AST 25 15 - 41 U/L   ALT 7 0 - 44 U/L   Alkaline Phosphatase 81 38 - 126 U/L   Total Bilirubin 0.5 0.0 - 1.2 mg/dL   GFR, Estimated 28 (L) >60 mL/min    Comment: (NOTE) Calculated using the CKD-EPI Creatinine Equation (2021)    Anion gap 10 5 - 15    Comment: Performed at [facility], [address] [place],  [place] [address]  CBC with Differential/Platelet     Status: Abnormal   Collection Time: 04/27/23  3:28 PM  Result Value Ref Range   WBC 7.4 4.0 - 10.5 K/uL   RBC 4.04 3.87 - 5.11 MIL/uL   Hemoglobin 11.4 (L) 12.0 - 15.0 g/dL   HCT 08.6 (L) 57.8 - 46.9 %   MCV 88.1 80.0 - 100.0 fL   MCH 28.2 26.0 - 34.0 pg   MCHC 32.0 30.0 - 36.0 g/dL   RDW 62.9 (H) 52.8 - 41.3 %   Platelets 271 150 - 400 K/uL   nRBC 0.0 0.0 - 0.2 %   Neutrophils Relative % 70 %   Neutro Abs 5.2 1.7 - 7.7 K/uL   Lymphocytes Relative 23 %   Lymphs Abs 1.7 0.7 - 4.0 K/uL   Monocytes Relative 5 %   Monocytes Absolute 0.4 0.1 - 1.0 K/uL   Eosinophils Relative 1 %   Eosinophils Absolute 0.1 0.0 - 0.5 K/uL   Basophils Relative 1 %   Basophils Absolute 0.0 0.0 - 0.1 K/uL   Immature Granulocytes 0 %   Abs Immature Granulocytes 0.02 0.00 - 0.07 K/uL     Comment: Performed at [facility], [address] [place], [place] [address]  UPEP/UIFE/Light Chains/TP, 24-Hr Ur     Status: Abnormal   Collection Time: 04/30/23 12:00 PM  Result Value Ref Range   Total Protein, Urine 4.6 Not Estab. mg/dL   Total Protein, Urine-Ur/day 46 30 - 150 mg/24 hr   Albumin, U 32.9 %   ALPHA 1 URINE 7.6 %   Alpha 2, Urine 13.8 %   % BETA, Urine 22.9 %   GAMMA GLOBULIN URINE 22.7 %   Free Kappa Lt Chains,Ur 23.97 1.17 - 86.46 mg/L   Free Lambda Lt Chains,Ur 6.84 0.27 - 15.21 mg/L   Free Kappa/Lambda Ratio 3.50 1.83 - 14.26    Comment: (NOTE) Performed At: [facility] [address] [place], [place] [id number] Jolene Schimke MD QI:[id number]    Immunofixation Result, Urine Comment (A)     Comment: (NOTE) Immunofixation shows IgG monoclonal protein with lambda light chain specificity.    Total Volume 1,000     Comment: Performed at [facility], [address], [place], [place] [address]   M-SPIKE %, Urine 9.3 (H) Not Observed %   M-Spike, Mg/24 Hr 4 (H) Not Observed mg/24 hr   Note: Comment     Comment: (NOTE) Protein electrophoresis scan will follow via computer, mail, or courier delivery.      RADIOGRAPHIC STUDIES:  I have personally reviewed the radiological images as listed and agree with the findings in the report.  DG Bone Survey Met Result Date: 05/08/2023 CLINICAL DATA:  Patient is suspected to have multiple myeloma. Evaluate for lytic bone lesions. EXAM: METASTATIC BONE SURVEY COMPARISON:  CTA chest 10/31/2017, CT abdomen and pelvis 03/22/2023. FINDINGS: No suspicious bone lesions of a lytic or blastic nature are seen in the axial and appendicular skeleton. Left lateral single calvarial view is unremarkable. Cervical spine demonstrates advanced C5-6 degenerative disc change and marginal osteophytes, multilevel facet hypertrophy. There are degenerative changes of both shoulders, and on the  left, 2 bone islands again noted in the humeral head. In the thoracic spine, there are degenerative changes and multilevel bridging enthesopathy, with multilevel degenerative discs, spondylosis and facet hypertrophy in the lumbar spine. PA chest demonstrates a normal heart size and clear lungs. Unremarkable mediastinum. Aortic atherosclerosis. Images through the pelvis and  lower extremities demonstrate asymmetric arthrosis and subcortical acetabular cystic change left hip, pelvic and trochanteric enthesopathy, mild arthrosis in the knees. There is an osteochondroma of the medial aspect of the distal right femoral diametaphysis, without erosive features. There are cylindrical calcifications in the femoral arteries, most suggestive of diabetic atherosclerosis. Patchy calcific plaque in the abdominal aorta. IMPRESSION: 1. No suspicious bone lesions are seen. 2. Degenerative changes in the spine, shoulders, hips and knees. 3. Aortic atherosclerosis. 4. Cylindrical calcifications in the femoral arteries, most suggestive of diabetic atherosclerosis. 5. Osteochondroma of the medial aspect of the distal right femoral diametaphysis, without erosive features. Electronically Signed   By: Almira Bar M.D.   On: 05/08/2023 21:25    I discussed the assessment and treatment plan with the patient. The patient was provided an opportunity to ask questions and all were answered. The patient agreed with the plan and demonstrated an understanding of the instructions. The patient was advised to call back or seek an in-person evaluation if the symptoms worsen or if the condition fails to improve as anticipated.    I spent 12 minutes over the phone with the patient reviewing test results, discuss management and coordination/planning of care.  Meryl Crutch, MD 05/18/2023 3:22 PM [facility] - A DEPT OF [facility] [address] [place]  [address] Dept: [phone] Dept Fax: [phone]   Future Appointments  Date Time Provider Department Center  07/13/2023  1:00 PM [facility] PHLEBOTOMIST [facility] None  07/27/2023 11:20 AM Felice Hope, Archie Patten, MD [facility] None  08/11/2023 11:30 AM Camnitz, Andree Coss, MD [facility]    This document was completed utilizing speech recognition software. Grammatical errors, random word insertions, pronoun errors, and incomplete sentences are an occasional consequence of this system due to software limitations, ambient noise, and hardware issues. Any formal questions or concerns about the content, text or information contained within the body of this dictation should be directly addressed to the provider for clarification.

## 2023-05-18 NOTE — Assessment & Plan Note (Signed)
-  Noted on workup for CVA in 2019.  She has been on chronic anticoagulation with Eliquis given her paroxysmal A-fib and history of CVA.  She will need indefinite anticoagulation.

## 2023-05-18 NOTE — Assessment & Plan Note (Signed)
 History of hypertension, but currently off antihypertensive medications due to low heart rate. Blood pressure is well-controlled at home, but increases in medical settings (white coat hypertension). -Continue monitoring blood pressure at home.

## 2023-05-18 NOTE — Assessment & Plan Note (Signed)
On 02/25/2023, labs showed creatinine of 2.11, calcium of 9.7, total protein 7.6 g/dL, albumin 4.6 g/dL.  Hemoglobin 11.6, MCV 86.  White count and platelet count were normal.  SPEP showed 0.9 g/dL of M spike.  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  Free kappa was 50.5 mg/L, lambda was 34.4 mg/L, ratio normal at 1.47.  Quantitative IgG was slightly elevated at 1639 mg/dL when the cutoff was 1610.  Quantitative IgA and IgM are within normal limits.  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   On her initial consultation with [place] on 04/27/2023, we pursued additional workup.  Hemoglobin was close to normal at 11.4.  White count and platelet count were within normal limits.  Creatinine improved at 1.79, calcium normal at 10.9, otherwise unremarkable CMP.  SPEP showed 0.7 g/dL of M protein.  IFE showed to be IgG lambda type.  Quantitative immunoglobulins were grossly within normal limits except for mildly elevated IgG of 1642 mg/dL.  Both kappa and lambda light chains were elevated (44.9 mg/L and 32 mg/L respectively), consistent with CKD picture, ratio was normal at 1.4.  Beta-2 microglobulin was elevated at 4.3 mg/L.  LDH normal.  24-hour urine protein electrophoresis showed 4.6 mg/dL of total protein, out of which 9.3% was M protein, amounting to 4 mg over 24 hours.  Small amount.  IFE on urine showed it to be IgG lambda protein.  X-ray skeletal survey on 04/27/2023 showed no lytic lesions.  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 She needs close surveillance with repeat labs every 3 months initially and if stable labs, we will space out clinic intervals.

## 2023-06-10 ENCOUNTER — Other Ambulatory Visit: Payer: Self-pay | Admitting: Physician Assistant

## 2023-06-28 LAB — LAB REPORT - SCANNED
Albumin, Urine POC: 61.6
Creatinine, POC: 54.9 mg/dL
EGFR: 28
Microalb Creat Ratio: 112

## 2023-07-02 ENCOUNTER — Encounter: Payer: Self-pay | Admitting: Nephrology

## 2023-07-13 ENCOUNTER — Inpatient Hospital Stay: Payer: Medicare Other | Attending: Oncology

## 2023-07-13 DIAGNOSIS — D6851 Activated protein C resistance: Secondary | ICD-10-CM | POA: Diagnosis not present

## 2023-07-13 DIAGNOSIS — D472 Monoclonal gammopathy: Secondary | ICD-10-CM | POA: Diagnosis present

## 2023-07-13 LAB — CBC WITH DIFFERENTIAL/PLATELET
Abs Immature Granulocytes: 0.02 10*3/uL (ref 0.00–0.07)
Basophils Absolute: 0 10*3/uL (ref 0.0–0.1)
Basophils Relative: 1 %
Eosinophils Absolute: 0.1 10*3/uL (ref 0.0–0.5)
Eosinophils Relative: 1 %
HCT: 36.5 % (ref 36.0–46.0)
Hemoglobin: 12 g/dL (ref 12.0–15.0)
Immature Granulocytes: 0 %
Lymphocytes Relative: 31 %
Lymphs Abs: 1.9 10*3/uL (ref 0.7–4.0)
MCH: 28.4 pg (ref 26.0–34.0)
MCHC: 32.9 g/dL (ref 30.0–36.0)
MCV: 86.5 fL (ref 80.0–100.0)
Monocytes Absolute: 0.3 10*3/uL (ref 0.1–1.0)
Monocytes Relative: 5 %
Neutro Abs: 3.7 10*3/uL (ref 1.7–7.7)
Neutrophils Relative %: 62 %
Platelets: 267 10*3/uL (ref 150–400)
RBC: 4.22 MIL/uL (ref 3.87–5.11)
RDW: 14.4 % (ref 11.5–15.5)
WBC: 6 10*3/uL (ref 4.0–10.5)
nRBC: 0 % (ref 0.0–0.2)

## 2023-07-13 LAB — COMPREHENSIVE METABOLIC PANEL
ALT: 8 U/L (ref 0–44)
AST: 27 U/L (ref 15–41)
Albumin: 4.6 g/dL (ref 3.5–5.0)
Alkaline Phosphatase: 73 U/L (ref 38–126)
Anion gap: 9 (ref 5–15)
BUN: 31 mg/dL — ABNORMAL HIGH (ref 8–23)
CO2: 27 mmol/L (ref 22–32)
Calcium: 9.9 mg/dL (ref 8.9–10.3)
Chloride: 102 mmol/L (ref 98–111)
Creatinine, Ser: 1.94 mg/dL — ABNORMAL HIGH (ref 0.44–1.00)
GFR, Estimated: 26 mL/min — ABNORMAL LOW (ref >60)
Glucose, Bld: 105 mg/dL — ABNORMAL HIGH (ref 70–99)
Potassium: 3.9 mmol/L (ref 3.5–5.1)
Sodium: 138 mmol/L (ref 135–145)
Total Bilirubin: 0.4 mg/dL (ref 0.0–1.2)
Total Protein: 8.1 g/dL (ref 6.5–8.1)

## 2023-07-13 LAB — URIC ACID: Uric Acid, Serum: 7.7 mg/dL — ABNORMAL HIGH (ref 2.5–7.1)

## 2023-07-14 LAB — KAPPA/LAMBDA LIGHT CHAINS
Kappa free light chain: 42.2 mg/L — ABNORMAL HIGH (ref 3.3–19.4)
Kappa, lambda light chain ratio: 1.24 (ref 0.26–1.65)
Lambda free light chains: 34 mg/L — ABNORMAL HIGH (ref 5.7–26.3)

## 2023-07-15 LAB — MULTIPLE MYELOMA PANEL, SERUM
Albumin SerPl Elph-Mcnc: 4 g/dL (ref 2.9–4.4)
Albumin/Glob SerPl: 1.2 (ref 0.7–1.7)
Alpha 1: 0.2 g/dL (ref 0.0–0.4)
Alpha2 Glob SerPl Elph-Mcnc: 0.7 g/dL (ref 0.4–1.0)
B-Globulin SerPl Elph-Mcnc: 1 g/dL (ref 0.7–1.3)
Gamma Glob SerPl Elph-Mcnc: 1.5 g/dL (ref 0.4–1.8)
Globulin, Total: 3.4 g/dL (ref 2.2–3.9)
IgA: 175 mg/dL (ref 64–422)
IgG (Immunoglobin G), Serum: 1578 mg/dL (ref 586–1602)
IgM (Immunoglobulin M), Srm: 98 mg/dL (ref 26–217)
M Protein SerPl Elph-Mcnc: 0.7 g/dL — ABNORMAL HIGH
Total Protein ELP: 7.4 g/dL (ref 6.0–8.5)

## 2023-07-27 ENCOUNTER — Other Ambulatory Visit: Payer: Medicare Other

## 2023-07-27 ENCOUNTER — Inpatient Hospital Stay: Payer: Medicare Other | Admitting: Oncology

## 2023-08-04 ENCOUNTER — Other Ambulatory Visit: Payer: Self-pay

## 2023-08-04 ENCOUNTER — Emergency Department (HOSPITAL_COMMUNITY)

## 2023-08-04 ENCOUNTER — Observation Stay (HOSPITAL_COMMUNITY)
Admission: EM | Admit: 2023-08-04 | Discharge: 2023-08-05 | Disposition: A | Discharge status: Home / Self Care | Attending: Internal Medicine | Admitting: Internal Medicine

## 2023-08-04 ENCOUNTER — Encounter (HOSPITAL_COMMUNITY): Payer: Self-pay | Admitting: Internal Medicine

## 2023-08-04 DIAGNOSIS — Z8673 Personal history of transient ischemic attack (TIA), and cerebral infarction without residual deficits: Secondary | ICD-10-CM | POA: Diagnosis not present

## 2023-08-04 DIAGNOSIS — Z6830 Body mass index (BMI) 30.0-30.9, adult: Secondary | ICD-10-CM | POA: Insufficient documentation

## 2023-08-04 DIAGNOSIS — R7401 Elevation of levels of liver transaminase levels: Secondary | ICD-10-CM | POA: Insufficient documentation

## 2023-08-04 DIAGNOSIS — Z86718 Personal history of other venous thrombosis and embolism: Secondary | ICD-10-CM | POA: Insufficient documentation

## 2023-08-04 DIAGNOSIS — I129 Hypertensive chronic kidney disease with stage 1 through stage 4 chronic kidney disease, or unspecified chronic kidney disease: Secondary | ICD-10-CM | POA: Insufficient documentation

## 2023-08-04 DIAGNOSIS — E669 Obesity, unspecified: Secondary | ICD-10-CM | POA: Diagnosis not present

## 2023-08-04 DIAGNOSIS — N184 Chronic kidney disease, stage 4 (severe): Secondary | ICD-10-CM | POA: Insufficient documentation

## 2023-08-04 DIAGNOSIS — Z950 Presence of cardiac pacemaker: Secondary | ICD-10-CM | POA: Diagnosis not present

## 2023-08-04 DIAGNOSIS — E785 Hyperlipidemia, unspecified: Secondary | ICD-10-CM | POA: Diagnosis not present

## 2023-08-04 DIAGNOSIS — Z79899 Other long term (current) drug therapy: Secondary | ICD-10-CM | POA: Diagnosis not present

## 2023-08-04 DIAGNOSIS — Z7901 Long term (current) use of anticoagulants: Secondary | ICD-10-CM | POA: Insufficient documentation

## 2023-08-04 DIAGNOSIS — I480 Paroxysmal atrial fibrillation: Secondary | ICD-10-CM | POA: Insufficient documentation

## 2023-08-04 DIAGNOSIS — I169 Hypertensive crisis, unspecified: Secondary | ICD-10-CM | POA: Diagnosis present

## 2023-08-04 DIAGNOSIS — R1010 Upper abdominal pain, unspecified: Principal | ICD-10-CM | POA: Insufficient documentation

## 2023-08-04 DIAGNOSIS — I10 Essential (primary) hypertension: Principal | ICD-10-CM

## 2023-08-04 LAB — COMPREHENSIVE METABOLIC PANEL WITH GFR
ALT: 19 U/L (ref 0–44)
AST: 48 U/L — ABNORMAL HIGH (ref 15–41)
Albumin: 3.4 g/dL — ABNORMAL LOW (ref 3.5–5.0)
Alkaline Phosphatase: 70 U/L (ref 38–126)
Anion gap: 12 (ref 5–15)
BUN: 25 mg/dL — ABNORMAL HIGH (ref 8–23)
CO2: 21 mmol/L — ABNORMAL LOW (ref 22–32)
Calcium: 9.3 mg/dL (ref 8.9–10.3)
Chloride: 104 mmol/L (ref 98–111)
Creatinine, Ser: 2.02 mg/dL — ABNORMAL HIGH (ref 0.44–1.00)
GFR, Estimated: 25 mL/min — ABNORMAL LOW (ref >60)
Glucose, Bld: 203 mg/dL — ABNORMAL HIGH (ref 70–99)
Potassium: 3.3 mmol/L — ABNORMAL LOW (ref 3.5–5.1)
Sodium: 137 mmol/L (ref 135–145)
Total Bilirubin: 1.1 mg/dL (ref 0.0–1.2)
Total Protein: 7.1 g/dL (ref 6.5–8.1)

## 2023-08-04 LAB — CBC WITH DIFFERENTIAL/PLATELET
Abs Immature Granulocytes: 0.53 10*3/uL — ABNORMAL HIGH (ref 0.00–0.07)
Basophils Absolute: 0.1 10*3/uL (ref 0.0–0.1)
Basophils Relative: 0 %
Eosinophils Absolute: 0.1 10*3/uL (ref 0.0–0.5)
Eosinophils Relative: 0 %
HCT: 36.1 % (ref 36.0–46.0)
Hemoglobin: 12 g/dL (ref 12.0–15.0)
Immature Granulocytes: 3 %
Lymphocytes Relative: 22 %
Lymphs Abs: 4.2 10*3/uL — ABNORMAL HIGH (ref 0.7–4.0)
MCH: 28.8 pg (ref 26.0–34.0)
MCHC: 33.2 g/dL (ref 30.0–36.0)
MCV: 86.6 fL (ref 80.0–100.0)
Monocytes Absolute: 0.8 10*3/uL (ref 0.1–1.0)
Monocytes Relative: 4 %
Neutro Abs: 14 10*3/uL — ABNORMAL HIGH (ref 1.7–7.7)
Neutrophils Relative %: 71 %
Platelets: 316 10*3/uL (ref 150–400)
RBC: 4.17 MIL/uL (ref 3.87–5.11)
RDW: 14.4 % (ref 11.5–15.5)
WBC: 19.7 10*3/uL — ABNORMAL HIGH (ref 4.0–10.5)
nRBC: 0 % (ref 0.0–0.2)

## 2023-08-04 LAB — TROPONIN I (HIGH SENSITIVITY)
Troponin I (High Sensitivity): 15 ng/L (ref <18)
Troponin I (High Sensitivity): 17 ng/L (ref <18)

## 2023-08-04 LAB — LIPASE, BLOOD: Lipase: 37 U/L (ref 11–51)

## 2023-08-04 MED ORDER — PIPERACILLIN-TAZOBACTAM 3.375 G IVPB
3.3750 g | Freq: Three times a day (TID) | INTRAVENOUS | Status: DC
  Administered 2023-08-04 – 2023-08-05 (×3): 3.375 g via INTRAVENOUS
  Filled 2023-08-04 (×3): qty 50

## 2023-08-04 MED ORDER — ONDANSETRON HCL 4 MG/2ML IJ SOLN
4.0000 mg | Freq: Once | INTRAMUSCULAR | Status: AC
  Administered 2023-08-04: 4 mg via INTRAVENOUS
  Filled 2023-08-04: qty 2

## 2023-08-04 MED ORDER — HYDRALAZINE HCL 20 MG/ML IJ SOLN
5.0000 mg | Freq: Once | INTRAMUSCULAR | Status: AC
  Administered 2023-08-04: 5 mg via INTRAVENOUS
  Filled 2023-08-04: qty 1

## 2023-08-04 MED ORDER — LACTATED RINGERS IV SOLN: INTRAVENOUS | Status: DC

## 2023-08-04 MED ORDER — ONDANSETRON HCL 4 MG/2ML IJ SOLN
4.0000 mg | Freq: Four times a day (QID) | INTRAMUSCULAR | Status: DC | PRN
  Administered 2023-08-04 (×2): 4 mg via INTRAVENOUS
  Filled 2023-08-04 (×2): qty 2

## 2023-08-04 MED ORDER — APIXABAN 5 MG PO TABS
5.0000 mg | ORAL_TABLET | Freq: Two times a day (BID) | ORAL | Status: DC
  Administered 2023-08-04 (×2): 5 mg via ORAL
  Filled 2023-08-04 (×2): qty 1

## 2023-08-04 MED ORDER — HYDRALAZINE HCL 20 MG/ML IJ SOLN
10.0000 mg | Freq: Once | INTRAMUSCULAR | Status: AC
  Administered 2023-08-04: 10 mg via INTRAVENOUS
  Filled 2023-08-04: qty 1

## 2023-08-04 MED ORDER — ONDANSETRON HCL 4 MG PO TABS: 4.0000 mg | ORAL_TABLET | Freq: Four times a day (QID) | ORAL | Status: DC | PRN

## 2023-08-04 MED ORDER — HYDROMORPHONE HCL 1 MG/ML IJ SOLN
0.5000 mg | Freq: Once | INTRAMUSCULAR | Status: AC
  Administered 2023-08-04: 0.5 mg via INTRAVENOUS
  Filled 2023-08-04: qty 1

## 2023-08-04 MED ORDER — METOCLOPRAMIDE HCL 5 MG/ML IJ SOLN
5.0000 mg | Freq: Once | INTRAMUSCULAR | Status: AC
  Administered 2023-08-04: 5 mg via INTRAVENOUS
  Filled 2023-08-04: qty 2

## 2023-08-04 MED ORDER — HYDRALAZINE HCL 20 MG/ML IJ SOLN
10.0000 mg | INTRAMUSCULAR | Status: DC | PRN
  Administered 2023-08-04 (×2): 10 mg via INTRAVENOUS
  Filled 2023-08-04 (×2): qty 1

## 2023-08-04 MED ORDER — HYDROMORPHONE HCL 1 MG/ML IJ SOLN
0.5000 mg | INTRAMUSCULAR | Status: DC | PRN
  Administered 2023-08-04: 0.5 mg via INTRAVENOUS
  Filled 2023-08-04: qty 0.5

## 2023-08-04 MED ORDER — PIPERACILLIN-TAZOBACTAM 3.375 G IVPB 30 MIN
3.3750 g | Freq: Once | INTRAVENOUS | Status: AC
  Administered 2023-08-04: 3.375 g via INTRAVENOUS
  Filled 2023-08-04: qty 50

## 2023-08-04 MED ORDER — ACETAMINOPHEN 325 MG PO TABS
650.0000 mg | ORAL_TABLET | Freq: Four times a day (QID) | ORAL | Status: DC | PRN
  Administered 2023-08-04: 650 mg via ORAL
  Filled 2023-08-04: qty 2

## 2023-08-04 MED ORDER — ACETAMINOPHEN 650 MG RE SUPP
650.0000 mg | Freq: Four times a day (QID) | RECTAL | Status: DC | PRN
Start: 2023-08-04 — End: 2023-08-05

## 2023-08-04 MED ORDER — FENTANYL CITRATE PF 50 MCG/ML IJ SOSY
50.0000 ug | PREFILLED_SYRINGE | Freq: Once | INTRAMUSCULAR | Status: AC
  Administered 2023-08-04: 50 ug via INTRAVENOUS
  Filled 2023-08-04: qty 1

## 2023-08-04 MED ORDER — PANTOPRAZOLE SODIUM 40 MG IV SOLR
40.0000 mg | Freq: Once | INTRAVENOUS | Status: AC
  Administered 2023-08-04: 40 mg via INTRAVENOUS
  Filled 2023-08-04: qty 10

## 2023-08-04 NOTE — H&P (Signed)
 History and Physical    Patient: Jasmine Dawson:[id number] DOB: 03-16-1946 DOA: 08/04/2023 DOS: the patient was seen and examined on 08/04/2023 PCP: Leilani Able, MD  Patient coming from: Home  Chief Complaint:  Chief Complaint  Patient presents with   Abdominal Pain   HPI: Jasmine Dawson is a 78 y.o. female with medical history significant of paroxysmal SVT, PVCs, PAF on eliquis, first degree AVB, HTN, HLD, CKD IV followed by [name], prior tricuspid regurgitation and pulmonary HTN, prothrombin gene mutation, heterozygous factor V Leiden who presented to ED with complaints of R sided abd pain  that started 2 days prior to this visit, associated with some subjective sob.Pt state she is able to tolerate sips with meds and is willing to try clears.   Symptoms started within 4-5hrs after eating well cooked hamburger at [employer] with symptoms peaking by the next day. No sick contacts.  On further questioning, pt denied diarrhea. Continues to have epigastric abd discomfort with nausea.  Initial sbp in the field was elevated as high as the 200's. In the ED, pt continued to complain of nausea, unable to tolerate PT. SBP remained elevated in the 200's, not much improved despite 5mg  hydralazine IV x1. CT abd was notable for evidence of generalized inflammation in the upper abdomen. Pt was given dose of zosyn. RUQ [place] was obtained in ED, results pending at the time of this dictation. Hospitalist consulted for consideration for admission.   Review of Systems: As mentioned in the history of present illness. All other systems reviewed and are negative. Past Medical History:  Diagnosis Date   CKD (chronic kidney disease), stage IV (HCC)    Heterozygous factor V Leiden mutation (HCC)    Hyperlipidemia    Hypertension    Hypertensive urgency    Motion sickness    PAF (paroxysmal atrial fibrillation) (HCC)    Preop cardiovascular exam 03/23/2023   Prothrombin gene mutation (HCC)     Pulmonary hypertension (HCC)    a. 2D echo 11/01/17 showed EF 65-70%, grade 1 DD, mod-severe TR, moderately increased PASP . b. F/u Echo 07/2018 not seen.   PVC's (premature ventricular contractions)    occasional PVCs   Sinus bradycardia    a. requiring reduction in beta blocker in 2020.   Stroke (cerebrum) (HCC)    SVT (supraventricular tachycardia) (HCC)    Tricuspid regurgitation    Past Surgical History:  Procedure Laterality Date   HYSTEROSCOPY WITH D & C  11/07/2011   Procedure: DILATATION AND CURETTAGE /HYSTEROSCOPY;  Surgeon: Antionette Char, MD;  Location: [facility];  Service: Gynecology;  Laterality: N/A;  endometrial polypectomy   LOOP RECORDER INSERTION N/A 07/30/2018   Procedure: LOOP RECORDER INSERTION;  Surgeon: Regan Lemming, MD;  Location: [facility] INVASIVE CV LAB;  Service: Cardiovascular;  Laterality: N/A;   TUBAL LIGATION  1977   Social History:  reports that she has never smoked. She has never used smokeless tobacco. She reports that she does not drink alcohol and does not use drugs.  Allergies  Allergen Reactions   Cefdinir     Other reaction(s): vomiting   Egg-Derived Products Anaphylaxis and Swelling   Orange Fruit [Citrus] Anaphylaxis and Swelling   Prednisone Anaphylaxis, Hives and Itching   Sulfa Antibiotics Anaphylaxis, Hives and Itching   Tomato Anaphylaxis and Swelling   Atorvastatin     Muscle cramps    Influenza Vaccines Other (See Comments)    unknown   Aspartame And Phenylalanine Palpitations   Caffeine  Palpitations   Shellfish Allergy Itching, Nausea And Vomiting and Rash    Family History  Problem Relation Age of Onset   Diabetes Mother        died at 70   Heart attack Mother    Heart attack Father        died at 48    Prior to Admission medications   Medication Sig Start Date End Date Taking? Authorizing Provider  Calcium Carbonate-Vitamin D (CALCIUM-VITAMIN D) 600-3.125 MG-MCG TABS Take 1 tablet by mouth daily.    [provider]  carboxymethylcellulose (REFRESH PLUS) 0.5 % SOLN Place 1 drop into both eyes daily as needed (for dry eyes). Patient not taking: Reported on 04/27/2023    [provider]  Chlorphen-PE-Acetaminophen (CORICIDIN D COLD/FLU/SINUS PO) Take 2 tablets by mouth as needed (Allergies). Patient not taking: Reported on 04/27/2023    [provider]  ELIQUIS 5 MG TABS tablet TAKE 1 TABLET(5 MG) BY MOUTH TWICE DAILY 02/23/23   Camnitz, Andree Coss, MD  famotidine (PEPCID) 20 MG tablet Take 20 mg by mouth 2 (two) times daily.    [provider]  fexofenadine-pseudoephedrine (ALLEGRA-D 24) 180-240 MG 24 hr tablet Take 1 tablet by mouth daily as needed (allergies). Patient not taking: Reported on 04/27/2023    [provider]    Physical Exam: Vitals:   08/04/23 0615 08/04/23 0700 08/04/23 0730 08/04/23 0815  BP: (!) 204/76 (!) 223/78 (!) 210/72 (!) 205/68  Pulse: 71 62  61  Resp: 16 17  20   Temp:      SpO2: 98% 98%  100%  Weight:      Height:       General exam: Awake, laying in bed, in nad Respiratory system: Normal respiratory effort, no wheezing Cardiovascular system: regular rate, s1, s2 Gastrointestinal system: Soft, nondistended, positive BS Central nervous system: CN2-12 grossly intact, strength intact Extremities: Perfused, no clubbing Skin: Normal skin turgor, no notable skin lesions seen Psychiatry: Mood normal // no visual hallucinations   Data Reviewed:  Labs reviewed: Na 137, K 3.3, Cr 2.02, WBC 19.7, Hgb 12.0, Plts 316  Assessment and Plan: Prior hx of Cholelithiasis with possible cholecystitis: In 11/24, patient presented with epigastric abdominal pain, about an hour after eating [employer] burger. On that admit, CT A/P: Chronic cholelithiasis with increased gallbladder wall thickening, concern for cholecystitis. General surgery was consulted with no indication for cholecystectomy as pt's symptoms resolved at that visit -CT  this visit with moderate GB distension without GB wall thickening -f/u on RUQ [place], results pending. If unremarkable, then would focus on treating for likely food borne gastroenteritis. If abnormal, would d/w General Surgery -Alk phos normal, LFT's largely unremarkable -WBC is elevated at 19k. Will continue empiric zosyn for now -cont on basal IVF   History of DVT: Paroxysmal atrial fibrillation on anticoagulation: -Patient has history of clotting disorder and is on Eliquis for paroxysmal atrial fibrillation.   -Continue eliquis -rate controlled   CKD stage IV: -Patient has history of stage 4 chronic kidney disease.  Her baseline creatinine is between 2 and 2.3. -She [facility] with [name]. -renal function stable at this time   Hyperlipidemia: -Continue Crestor 10 mg daily.   Hypertension: -Presenting bp is poorly controlled in the 200's -Chart reviewed. Pt is followed by Cardiology. Pt is thought to have "white coat syndrome" with home bp much improved -BP now improving with PRN hydralazine. Will continue as needed    Advance Care  Planning:   Code Status: Prior Full  Consults:   Family Communication: Pt in room  Severity of Illness: The appropriate patient status for this patient is INPATIENT. Inpatient status is judged to be reasonable and necessary in order to provide the required intensity of service to ensure the patient's safety. The patient's presenting symptoms, physical exam findings, and initial radiographic and laboratory data in the context of their chronic comorbidities is felt to place them at high risk for further clinical deterioration. Furthermore, it is not anticipated that the patient will be medically stable for discharge from the hospital within 2 midnights of admission.   * I certify that at the point of admission it is my clinical judgment that the patient will require inpatient hospital care spanning beyond 2 midnights from  the point of admission due to high intensity of service, high risk for further deterioration and high frequency of surveillance required.*  Author: Rickey Barbara, MD 08/04/2023 9:01 AM  For on call review [email or web address].

## 2023-08-04 NOTE — ED Notes (Signed)
 Phleb to obtain troponin

## 2023-08-04 NOTE — ED Notes (Signed)
 Patient transported to admitting floor via stretcher. All belongingness sent with patient.

## 2023-08-04 NOTE — ED Provider Notes (Signed)
 Care transferred to me.  CT shows mild amount of ascites and intra-abdominal infection without a clear source.  Could be peptic ulcer disease but could be cholecystitis.  Will get a right upper quadrant ultrasound and admit for further pain management and symptom control.  Is currently having abdominal pain and nausea.  I discussed case with the hospitalist, Dr. Rhona Leavens, who will admit.  He advises an extra dose of hydralazine, will go to 10 mg.      Pricilla Loveless, MD 08/04/23 1535

## 2023-08-04 NOTE — ED Provider Notes (Signed)
 [facility] Provider Note   CSN: [id number] Arrival date & time: 08/04/23  4098     History  Chief Complaint  Patient presents with   Abdominal Pain    Jasmine Dawson is a 78 y.o. female.  The history is provided by the patient, the EMS personnel and medical records.  Abdominal Pain Jasmine Dawson is a 78 y.o. female who presents to the Emergency Department complaining of abdominal pain.  She presents to the emergency department by EMS for evaluation of right-sided abdominal pain that started 2 days ago.  Initially pain was waxing waning and over the last 24 hours have significantly worsened.  This evening she developed associated shortness of breath as well.  On EMS arrival they report blood pressures in the 200s systolic.  She received 100 mcg of fentanyl with improvement in her pain as well as her blood pressure.  She denies any chest pain.  She does report that her abdominal pain is improved.  No definite fevers although she has felt warm at times.     Home Medications Prior to Admission medications   Medication Sig Start Date End Date Taking? Authorizing Provider  Calcium Carbonate-Vitamin D (CALCIUM-VITAMIN D) 600-3.125 MG-MCG TABS Take 1 tablet by mouth daily.    [provider]  carboxymethylcellulose (REFRESH PLUS) 0.5 % SOLN Place 1 drop into both eyes daily as needed (for dry eyes). Patient not taking: Reported on 04/27/2023    [provider]  Chlorphen-PE-Acetaminophen (CORICIDIN D COLD/FLU/SINUS PO) Take 2 tablets by mouth as needed (Allergies). Patient not taking: Reported on 04/27/2023    [provider]  ELIQUIS 5 MG TABS tablet TAKE 1 TABLET(5 MG) BY MOUTH TWICE DAILY 02/23/23   Camnitz, Andree Coss, MD  famotidine (PEPCID) 20 MG tablet Take 20 mg by mouth 2 (two) times daily.    [provider]  fexofenadine-pseudoephedrine (ALLEGRA-D 24) 180-240 MG 24 hr tablet Take 1 tablet by  mouth daily as needed (allergies). Patient not taking: Reported on 04/27/2023    [provider]      Allergies    Cefdinir, Egg-derived products, Orange fruit [citrus], Prednisone, Sulfa antibiotics, Tomato, Atorvastatin, Influenza vaccines, Aspartame and phenylalanine, Caffeine, and Shellfish allergy    Review of Systems   Review of Systems  Gastrointestinal:  Positive for abdominal pain.  All other systems reviewed and are negative.   Physical Exam Updated Vital Signs BP (!) 223/78   Pulse 62   Temp 98.3 F (36.8 C)   Resp 17   Ht 5\' 1"  (1.549 m)   Wt 68.9 kg   SpO2 98%   BMI 28.72 kg/m  Physical Exam Vitals and nursing note reviewed.  Constitutional:      Appearance: She is well-developed.  HENT:     Head: Normocephalic and atraumatic.  Cardiovascular:     Rate and Rhythm: Normal rate and regular rhythm.     Heart sounds: No murmur heard. Pulmonary:     Effort: Pulmonary effort is normal. No respiratory distress.     Breath sounds: Normal breath sounds.  Abdominal:     Palpations: Abdomen is soft.     Tenderness: There is no guarding or rebound.     Comments: Mild generalized abdominal tenderness  Musculoskeletal:        General: No tenderness.  Skin:    General: Skin is warm and dry.  Neurological:     Mental Status: She is alert and oriented to  person, place, and time.  Psychiatric:        Behavior: Behavior normal.     ED Results / Procedures / Treatments   Labs (all labs ordered are listed, but only abnormal results are displayed) Labs Reviewed  COMPREHENSIVE METABOLIC PANEL WITH GFR - Abnormal; Notable for the following components:      Result Value   Potassium 3.3 (*)    CO2 21 (*)    Glucose, Bld 203 (*)    BUN 25 (*)    Creatinine, Ser 2.02 (*)    Albumin 3.4 (*)    AST 48 (*)    GFR, Estimated 25 (*)    All other components within normal limits  CBC WITH DIFFERENTIAL/PLATELET - Abnormal; Notable for the following components:    WBC 19.7 (*)    Neutro Abs 14.0 (*)    Lymphs Abs 4.2 (*)    Abs Immature Granulocytes 0.53 (*)    All other components within normal limits  LIPASE, BLOOD  TROPONIN I (HIGH SENSITIVITY)  TROPONIN I (HIGH SENSITIVITY)    EKG EKG Interpretation Date/Time:  Tuesday August 04 2023 05:17:33 EDT Ventricular Rate:  79 PR Interval:  176 QRS Duration:  98 QT Interval:  400 QTC Calculation: 459 R Axis:   34  Text Interpretation: Sinus rhythm Probable left atrial enlargement Confirmed by Tilden Fossa [id number]) on 08/04/2023 5:47:39 AM  Radiology DG Chest Port 1 View Result Date: 08/04/2023 CLINICAL DATA:  Shortness of breath and hypertension EXAM: PORTABLE CHEST 1 VIEW COMPARISON:  03/22/2018 FINDINGS: Normal heart size and mediastinal contours. No acute infiltrate or edema. No effusion or pneumothorax. No acute osseous findings. Artifact from EKG leads. IMPRESSION: No evidence of active disease. Electronically Signed   By: Tiburcio Pea M.D.   On: 08/04/2023 06:38    Procedures Procedures    Medications Ordered in ED Medications  hydrALAZINE (APRESOLINE) injection 5 mg (has no administration in time range)  fentaNYL (SUBLIMAZE) injection 50 mcg (50 mcg Intravenous Given 08/04/23 0611)  ondansetron (ZOFRAN) injection 4 mg (4 mg Intravenous Given 08/04/23 0611)  piperacillin-tazobactam (ZOSYN) IVPB 3.375 g (0 g Intravenous Stopped 08/04/23 0641)    ED Course/ Medical Decision Making/ A&P                                 Medical Decision Making Amount and/or Complexity of Data Reviewed Labs: ordered. Radiology: ordered.  Risk Prescription drug management.   Patient with history of factor V Leiden, CKD here for evaluation of right upper quadrant pain by EMS.  She did have significant hypertension with the EMS, blood pressure initially did improve with pain medication administration.  Patient hypertensive here, did not significantly change with pain medications, will treat with a dose  of hydralazine.  BMP with stable renal insufficiency.  CBC with leukocytosis, concern for acute intra-abdominal infection, will start on antibiotics.  Patient's pain is improved after medications in department.  Patient care transferred pending imaging reports and reevaluation.        Final Clinical Impression(s) / ED Diagnoses Final diagnoses:  None    Rx / DC Orders ED Discharge Orders     None         Tilden Fossa, MD 08/04/23 [phone]

## 2023-08-04 NOTE — Progress Notes (Signed)
 Pharmacy Antibiotic Note  Jasmine Dawson is a 78 y.o. female admitted on 08/04/2023 with intra-abdominal infection.  Pharmacy has been consulted for zosyn dosing.  Plan: Zosyn 3.375g IV q8h (4 hour infusion).  Height: 5\' 1"  (154.9 cm) Weight: 68.9 kg (152 lb) IBW/kg (Calculated) : 47.8  Temp (24hrs), Avg:98.2 F (36.8 C), Min:98.1 F (36.7 C), Max:98.3 F (36.8 C)  Recent Labs  Lab 08/04/23 0524  WBC 19.7*  CREATININE 2.02*    Estimated Creatinine Clearance: 20 mL/min (A) (by C-G formula based on SCr of 2.02 mg/dL (H)).    Allergies  Allergen Reactions   Cefdinir     Other reaction(s): vomiting   Egg-Derived Products Anaphylaxis and Swelling   Orange Fruit [Citrus] Anaphylaxis and Swelling   Prednisone Anaphylaxis, Hives and Itching   Sulfa Antibiotics Anaphylaxis, Hives and Itching   Tomato Anaphylaxis and Swelling   Atorvastatin     Muscle cramps    Influenza Vaccines Other (See Comments)    unknown   Aspartame And Phenylalanine Palpitations   Caffeine Palpitations   Shellfish Allergy Itching, Nausea And Vomiting and Rash     Thank you for allowing pharmacy to be a part of this patient's care.  Toniann Fail Acxel Dingee 08/04/2023 10:13 AM

## 2023-08-04 NOTE — ED Notes (Signed)
 MD made aware of patient's nausea

## 2023-08-04 NOTE — ED Triage Notes (Signed)
 Pt reporting Rt sided abdominal pain that started on Sunday after eating a cheeseburger. Denies N/V/D.

## 2023-08-04 NOTE — ED Notes (Signed)
 MD notified of elevated BP. Meds ordered.

## 2023-08-05 ENCOUNTER — Encounter (HOSPITAL_COMMUNITY): Payer: Self-pay | Admitting: Internal Medicine

## 2023-08-05 ENCOUNTER — Other Ambulatory Visit (HOSPITAL_COMMUNITY): Payer: Self-pay

## 2023-08-05 DIAGNOSIS — I480 Paroxysmal atrial fibrillation: Secondary | ICD-10-CM

## 2023-08-05 DIAGNOSIS — I169 Hypertensive crisis, unspecified: Secondary | ICD-10-CM | POA: Diagnosis not present

## 2023-08-05 DIAGNOSIS — R109 Unspecified abdominal pain: Secondary | ICD-10-CM

## 2023-08-05 DIAGNOSIS — R1010 Upper abdominal pain, unspecified: Secondary | ICD-10-CM | POA: Diagnosis not present

## 2023-08-05 LAB — CBC
HCT: 36.2 % (ref 36.0–46.0)
Hemoglobin: 12.3 g/dL (ref 12.0–15.0)
MCH: 28.5 pg (ref 26.0–34.0)
MCHC: 34 g/dL (ref 30.0–36.0)
MCV: 84 fL (ref 80.0–100.0)
Platelets: 311 10*3/uL (ref 150–400)
RBC: 4.31 MIL/uL (ref 3.87–5.11)
RDW: 14.7 % (ref 11.5–15.5)
WBC: 16.9 10*3/uL — ABNORMAL HIGH (ref 4.0–10.5)
nRBC: 0 % (ref 0.0–0.2)

## 2023-08-05 LAB — COMPREHENSIVE METABOLIC PANEL WITH GFR
ALT: 59 U/L — ABNORMAL HIGH (ref 0–44)
AST: 95 U/L — ABNORMAL HIGH (ref 15–41)
Albumin: 2.9 g/dL — ABNORMAL LOW (ref 3.5–5.0)
Alkaline Phosphatase: 76 U/L (ref 38–126)
Anion gap: 12 (ref 5–15)
BUN: 30 mg/dL — ABNORMAL HIGH (ref 8–23)
CO2: 23 mmol/L (ref 22–32)
Calcium: 8.9 mg/dL (ref 8.9–10.3)
Chloride: 104 mmol/L (ref 98–111)
Creatinine, Ser: 2.24 mg/dL — ABNORMAL HIGH (ref 0.44–1.00)
GFR, Estimated: 22 mL/min — ABNORMAL LOW (ref >60)
Glucose, Bld: 125 mg/dL — ABNORMAL HIGH (ref 70–99)
Potassium: 4.1 mmol/L (ref 3.5–5.1)
Sodium: 139 mmol/L (ref 135–145)
Total Bilirubin: 1.1 mg/dL (ref 0.0–1.2)
Total Protein: 6.8 g/dL (ref 6.5–8.1)

## 2023-08-05 MED ORDER — PANTOPRAZOLE SODIUM 40 MG PO TBEC: 40.0000 mg | DELAYED_RELEASE_TABLET | Freq: Every day | ORAL | Status: DC

## 2023-08-05 MED ORDER — AMOXICILLIN-POT CLAVULANATE 875-125 MG PO TABS: 1.0000 | ORAL_TABLET | Freq: Two times a day (BID) | ORAL | 0 refills | Status: DC

## 2023-08-05 MED ORDER — METOPROLOL TARTRATE 25 MG PO TABS
25.0000 mg | ORAL_TABLET | Freq: Two times a day (BID) | ORAL | Status: DC
  Administered 2023-08-05: 25 mg via ORAL
  Filled 2023-08-05: qty 1

## 2023-08-05 MED ORDER — AMOXICILLIN-POT CLAVULANATE 500-125 MG PO TABS: 1.0000 | ORAL_TABLET | Freq: Two times a day (BID) | ORAL | 0 refills | Status: DC

## 2023-08-05 MED ORDER — PANTOPRAZOLE SODIUM 40 MG PO TBEC: 40.0000 mg | DELAYED_RELEASE_TABLET | Freq: Every day | ORAL | 0 refills | Status: DC

## 2023-08-05 MED ORDER — METOPROLOL TARTRATE 25 MG PO TABS: 25.0000 mg | ORAL_TABLET | Freq: Two times a day (BID) | ORAL | 0 refills | Status: DC

## 2023-08-05 MED ORDER — AMOXICILLIN-POT CLAVULANATE 500-125 MG PO TABS: 1.0000 | ORAL_TABLET | Freq: Two times a day (BID) | ORAL | 0 refills | Status: AC

## 2023-08-05 NOTE — Progress Notes (Signed)
 DISCHARGE NOTE HOME ELAN MCELVAIN to be discharged Home per MD order. Discussed prescriptions and follow up appointments with the patient. Prescriptions given to patient; medication list explained in detail. Patient verbalized understanding.  Skin clean, dry and intact without evidence of skin break down, no evidence of skin tears noted. IV catheter discontinued intact. Site without signs and symptoms of complications. Dressing and pressure applied. Pt denies pain at the site currently. No complaints noted.  Patient free of lines, drains, and wounds.   An After Visit Summary (AVS) was printed and given to the patient. Patient escorted via wheelchair, and discharged home via private auto.  Velia Meyer, RN

## 2023-08-05 NOTE — Care Management CC44 (Signed)
 Condition Code 44 Documentation Completed  Patient Details  Name: Jasmine Dawson MRN: [id number] Date of Birth: [birth year]   Condition Code 44 given:  Yes Patient signature on Condition Code 44 notice:  Yes Documentation of 2 MD's agreement:  Yes Code 44 added to claim:  Yes    Gordy Clement, RN 08/05/2023, 2:40 PM

## 2023-08-05 NOTE — Progress Notes (Signed)
 Brief note Saw patient for the first time this morning-abdominal pain has resolved but she is very suspicious-and not very cooperative.  Tells me that I will hear from a lawyer-she is concerned about fall signs in the room-and people moving around the hallway.  I tried my best to explain that these are standard hospital signs and the people moving around in the hallway and nurses/nursing techs and doctors.  She is requesting discharge and tells me she is going to go to [facility].  Since her abdominal pain has resolved-exam is benign-will advance her diet and see how she does.  Later-family arrived-spoke with spouse/daughter-explained above-will see how she does with diet and then make discharge plans accordingly.

## 2023-08-05 NOTE — Progress Notes (Signed)
   08/05/23 1111  TOC Brief Assessment  Insurance and Status Reviewed (Medicare A and B)  Patient has primary care physician Yes Pecola Leisure, Betti, MD)  Home environment has been reviewed From home with Husband and Adult Daughter  Prior level of function: very independent  Prior/Current Home Services No current home services  Social Drivers of Health Review SDOH reviewed no interventions necessary  Readmission risk has been reviewed Yes (12%)  Transition of care needs no transition of care needs at this time   Do not anticipate any TOC needs

## 2023-08-05 NOTE — Discharge Summary (Addendum)
 PATIENT DETAILS Name: Jasmine Dawson Age: 78 y.o. Sex: female Date of Birth: 16-Jul-1945 MRN: [id number]. Admitting Physician: Jerald Kief, MD WUJ:[id number], Jocelyn Lamer, MD  Admit Date: 08/04/2023 Discharge date: 08/05/2023  Recommendations for Outpatient Follow-up:  Follow up with PCP in 1-2 weeks Please obtain CMP/CBC in one week  Admitted From:  Home  Disposition: Home   Discharge Condition: good  CODE STATUS:   Code Status: Full Code   Diet recommendation:  Diet Order             Diet Heart Fluid consistency: Thin  Diet effective now           Diet - low sodium heart healthy                    Brief Summary: 78 year old with history of PAF, paroxysmal SVT, HTN, HLD, CKD 4 who developed upper abdominal pain after eating her burger and [employer].  She has had similar symptoms in the past attributed to food poisoning.  She was subsequently admitted to the hospitalist service.  Significant studies 4/8>> CT abdomen/pelvis: Fat stranding in the upper abdomen suggesting intra-abdominal inflammation-distal stomach/proximal duodenum appeared thickened.  Cholelithiasis 4/8>> RUQ ultrasound: Cholelithiasis without indication of acute cholecystitis 4/8>> lipase: 37  Brief Hospital Course: Upper abdominal pain Resolved with supportive care Kept on clear liquid diet-diet has been advanced to regular diet today which she seems to have tolerated CT abdomen/RUQ ultrasound nonacute Suspicion that given similar prior history of "food poisoning"-this could be something similar-although she did not have any nausea, vomiting or diarrhea. CT abdomen also suggestive of gastritis/duodenitis-will continue PPI on discharge. She was empirically placed on Zosyn on initial presentation-leukocytosis downtrending-no fever-will provide Augmentin for a few more days. Follow-up with PCP.  Uncontrolled hypertension Initially SBP in the 200s range BP improved with as needed hydralazine-still  in the 150s-160s range systolic.  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  Somewhat anxious-and suspicious about numerous issues (see prior note) She was also having some amount of sinus tachycardia. Given low-dose metoprolol with resolution of tachycardia and more better blood pressure control.  She is agreeable to continue metoprolol on discharge-follow-up with PCP.  Mild transaminitis Possibly related to viral syndrome/"food poisoning" Minimal/mild transaminitis. Repeat LFTs at PCPs office in 1 week.  PAF History of VTE Continue Eliquis See above regarding metoprolol  CKD 4 Creatinine close to baseline Follow-up with primary nephrologist postdischarge.  Note-please see my prior note from today-very suspicious about nursing staff/MD.  Initially wanted to leave and go to [facility].  Reassurance provided-subsequently family arrived-patient now much more cooperative.  Obesity: Estimated body mass index is 30.74 kg/m as calculated from the following:   Height as of this encounter: 5\' 1"  (1.549 m).   Weight as of this encounter: 73.8 kg.    Discharge Instructions:  Activity:  As tolerated    Discharge Instructions     Call MD for:  persistant nausea and vomiting   Complete by: As directed    Call MD for:  severe uncontrolled pain   Complete by: As directed    Diet - low sodium heart healthy   Complete by: As directed    Discharge instructions   Complete by: As directed    Follow with Primary MD  Leilani Able, MD in 1-2 weeks  Please get a complete blood count and chemistry panel checked by your Primary MD at your next visit, and again as instructed by your Primary MD.  Please obtain  a repeat liver function panel at PCPs office one 1 week-minimally elevated liver enzymes-need to check in 1 week.  Get Medicines reviewed and adjusted: Please take all your medications with you for your next visit with your Primary MD  Laboratory/radiological data: Please request  your Primary MD to go over all hospital tests and procedure/radiological results at the follow up, please ask your Primary MD to get all Hospital records sent to his/her office.  In some cases, they will be blood work, cultures and biopsy results pending at the time of your discharge. Please request that your primary care M.D. follows up on these results.  Also Note the following: If you experience worsening of your admission symptoms, develop shortness of breath, life threatening emergency, suicidal or homicidal thoughts you must seek medical attention immediately by calling 911 or calling your MD immediately  if symptoms less severe.  You must read complete instructions/literature along with all the possible adverse reactions/side effects for all the Medicines you take and that have been prescribed to you. Take any new Medicines after you have completely understood and accpet all the possible adverse reactions/side effects.   Do not drive when taking Pain medications or sleeping medications (Benzodaizepines)  Do not take more than prescribed Pain, Sleep and Anxiety Medications. It is not advisable to combine anxiety,sleep and pain medications without talking with your primary care practitioner  Special Instructions: If you have smoked or chewed Tobacco  in the last 2 yrs please stop smoking, stop any regular Alcohol  and or any Recreational drug use.  Wear Seat belts while driving.  Please note: You were cared for by a hospitalist during your hospital stay. Once you are discharged, your primary care physician will handle any further medical issues. Please note that NO REFILLS for any discharge medications will be authorized once you are discharged, as it is imperative that you return to your primary care physician (or establish a relationship with a primary care physician if you do not have one) for your post hospital discharge needs so that they can reassess your need for medications and monitor  your lab values.   Increase activity slowly   Complete by: As directed       Allergies as of 08/05/2023       Reactions   Cefdinir    Other reaction(s): vomiting   Egg-derived Products Anaphylaxis, Swelling   Orange Fruit [citrus] Anaphylaxis, Swelling   Prednisone Anaphylaxis, Hives, Itching   Sulfa Antibiotics Anaphylaxis, Hives, Itching   Tomato Anaphylaxis, Swelling   Atorvastatin    Muscle cramps   Influenza Vaccines Other (See Comments)   unknown   Aspartame And Phenylalanine Palpitations   Caffeine Palpitations   Shellfish Allergy Itching, Nausea And Vomiting, Rash        Medication List     STOP taking these medications    famotidine 20 MG tablet Commonly known as: PEPCID       TAKE these medications    amoxicillin-clavulanate 500-125 MG tablet Commonly known as: Augmentin Take 1 tablet by mouth 2 (two) times daily for 4 days.   Eliquis 5 MG Tabs tablet Generic drug: apixaban TAKE 1 TABLET(5 MG) BY MOUTH TWICE DAILY   metoprolol tartrate 25 MG tablet Commonly known as: LOPRESSOR Take 1 tablet (25 mg total) by mouth 2 (two) times daily.   pantoprazole 40 MG tablet Commonly known as: PROTONIX Take 1 tablet (40 mg total) by mouth daily at 12 noon.  Follow-up Information     Leilani Able, MD. Schedule an appointment as soon as possible for a visit in 1 week(s).   Specialty: Family Medicine Contact information: [address] [place] [address] [phone]                Allergies  Allergen Reactions   Cefdinir     Other reaction(s): vomiting   Egg-Derived Products Anaphylaxis and Swelling   Orange Fruit [Citrus] Anaphylaxis and Swelling   Prednisone Anaphylaxis, Hives and Itching   Sulfa Antibiotics Anaphylaxis, Hives and Itching   Tomato Anaphylaxis and Swelling   Atorvastatin     Muscle cramps    Influenza Vaccines Other (See Comments)    unknown   Aspartame And Phenylalanine Palpitations   Caffeine  Palpitations   Shellfish Allergy Itching, Nausea And Vomiting and Rash     Other Procedures/Studies: US Abdomen Limited RUQ (LIVER/GB) Result Date: 08/04/2023 CLINICAL DATA:  Abdominal pain EXAM: ULTRASOUND ABDOMEN LIMITED RIGHT UPPER QUADRANT COMPARISON:  Abdominal CT from earlier today FINDINGS: Gallbladder: Known cholelithiasis measuring up to 9 mm. No wall thickening or focal tenderness. Common bile duct: Diameter: 3 mm Liver: Lobulated but largely simple cyst in the lower central liver measuring up to 8 cm. Portal vein is patent on color Doppler imaging with normal direction of blood flow towards the liver. Other: Right upper quadrant ascites, known from prior CT. IMPRESSION: Cholelithiasis without indication of acute cholecystitis. Electronically Signed   By: Tiburcio Pea M.D.   On: 08/04/2023 10:49   CT ABDOMEN PELVIS WO CONTRAST Result Date: 08/04/2023 CLINICAL DATA:  Acute, nonlocalized abdominal pain EXAM: CT ABDOMEN AND PELVIS WITHOUT CONTRAST TECHNIQUE: Multidetector CT imaging of the abdomen and pelvis was performed following the standard protocol without IV contrast. RADIATION DOSE REDUCTION: This exam was performed according to the departmental dose-optimization program which includes automated exposure control, adjustment of the mA and/or kV according to patient size and/or use of iterative reconstruction technique. COMPARISON:  03/22/2023 abdominal CT FINDINGS: Lower chest: Mild atelectasis at the lung bases. Small pericardial effusion. Small sliding hiatal hernia. Hepatobiliary: Chronic large bilobed cyst in the lower central liver measuring 8 cm with faint calcification along the lower cyst also seen on prior. No superimposed hemorrhage or fluid level seen.Cholelithiasis. There is moderate distension of the gallbladder without detectable wall thickening. No bile duct dilatation. Pancreas: Unremarkable. Spleen: Unremarkable. Adrenals/Urinary Tract: Negative adrenals. No hydronephrosis or  stone. 2.1 cm interpolar cyst on the right. Unremarkable bladder. Stomach/Bowel: The gastric antrum and proximal duodenum appear thickened with no discrete ulceration seen. No bowel obstruction. Vascular/Lymphatic: No acute vascular abnormality. Extensive atheromatous calcification of the aorta and iliacs. No mass or adenopathy. Reproductive:Stable. Other: Fat stranding in the upper abdomen with small volume ascites both under the diaphragm and in the pelvis. The ascitic fluid is low-density. No pneumoperitoneum Musculoskeletal: Extensive spinal degeneration. Multiple bridging thoracic osteophytes. No acute or aggressive finding. IMPRESSION: Fat stranding in the upper abdomen with small volume ascitic suggesting intra-abdominal inflammation. The distal stomach and proximal duodenum appear somewhat thickened, favor peptic ulcer disease over cholecystitis. No pneumoperitoneum seen. If persisting symptoms, suggest postcontrast CT. Cholelithiasis. Electronically Signed   By: Tiburcio Pea M.D.   On: 08/04/2023 08:07   DG Chest Port 1 View Result Date: 08/04/2023 CLINICAL DATA:  Shortness of breath and hypertension EXAM: PORTABLE CHEST 1 VIEW COMPARISON:  03/22/2018 FINDINGS: Normal heart size and mediastinal contours. No acute infiltrate or edema. No effusion or pneumothorax. No acute osseous findings. Artifact  from EKG leads. IMPRESSION: No evidence of active disease. Electronically Signed   By: Tiburcio Pea M.D.   On: 08/04/2023 06:38     TODAY-DAY OF DISCHARGE:  Subjective:   Jasmine Dawson today has no headache,no chest abdominal pain,no new weakness tingling or numbness, feels much better wants to go home today.   Objective:   Blood pressure (!) 146/89, pulse (!) 119, temperature 98.7 F (37.1 C), temperature source Oral, resp. rate (!) 23, height 5\' 1"  (1.549 m), weight 73.8 kg, SpO2 92%.  Intake/Output Summary (Last 24 hours) at 08/05/2023 1343 Last data filed at 08/05/2023 0105 Gross per 24  hour  Intake 375.11 ml  Output --  Net 375.11 ml   Filed Weights   08/04/23 0518 08/04/23 2121  Weight: 68.9 kg 73.8 kg    Exam: Awake Alert, Oriented *3, No new F.N deficits, Normal affect [place].AT,PERRAL Supple Neck,No JVD, No cervical lymphadenopathy appriciated.  Symmetrical Chest wall movement, Good air movement bilaterally, CTAB RRR,No Gallops,Rubs or new Murmurs, No Parasternal Heave +ve B.Sounds, Abd Soft, Non tender, No organomegaly appriciated, No rebound -guarding or rigidity. No Cyanosis, Clubbing or edema, No new Rash or bruise   PERTINENT RADIOLOGIC STUDIES: US Abdomen Limited RUQ (LIVER/GB) Result Date: 08/04/2023 CLINICAL DATA:  Abdominal pain EXAM: ULTRASOUND ABDOMEN LIMITED RIGHT UPPER QUADRANT COMPARISON:  Abdominal CT from earlier today FINDINGS: Gallbladder: Known cholelithiasis measuring up to 9 mm. No wall thickening or focal tenderness. Common bile duct: Diameter: 3 mm Liver: Lobulated but largely simple cyst in the lower central liver measuring up to 8 cm. Portal vein is patent on color Doppler imaging with normal direction of blood flow towards the liver. Other: Right upper quadrant ascites, known from prior CT. IMPRESSION: Cholelithiasis without indication of acute cholecystitis. Electronically Signed   By: Tiburcio Pea M.D.   On: 08/04/2023 10:49   CT ABDOMEN PELVIS WO CONTRAST Result Date: 08/04/2023 CLINICAL DATA:  Acute, nonlocalized abdominal pain EXAM: CT ABDOMEN AND PELVIS WITHOUT CONTRAST TECHNIQUE: Multidetector CT imaging of the abdomen and pelvis was performed following the standard protocol without IV contrast. RADIATION DOSE REDUCTION: This exam was performed according to the departmental dose-optimization program which includes automated exposure control, adjustment of the mA and/or kV according to patient size and/or use of iterative reconstruction technique. COMPARISON:  03/22/2023 abdominal CT FINDINGS: Lower chest: Mild atelectasis at the lung  bases. Small pericardial effusion. Small sliding hiatal hernia. Hepatobiliary: Chronic large bilobed cyst in the lower central liver measuring 8 cm with faint calcification along the lower cyst also seen on prior. No superimposed hemorrhage or fluid level seen.Cholelithiasis. There is moderate distension of the gallbladder without detectable wall thickening. No bile duct dilatation. Pancreas: Unremarkable. Spleen: Unremarkable. Adrenals/Urinary Tract: Negative adrenals. No hydronephrosis or stone. 2.1 cm interpolar cyst on the right. Unremarkable bladder. Stomach/Bowel: The gastric antrum and proximal duodenum appear thickened with no discrete ulceration seen. No bowel obstruction. Vascular/Lymphatic: No acute vascular abnormality. Extensive atheromatous calcification of the aorta and iliacs. No mass or adenopathy. Reproductive:Stable. Other: Fat stranding in the upper abdomen with small volume ascites both under the diaphragm and in the pelvis. The ascitic fluid is low-density. No pneumoperitoneum Musculoskeletal: Extensive spinal degeneration. Multiple bridging thoracic osteophytes. No acute or aggressive finding. IMPRESSION: Fat stranding in the upper abdomen with small volume ascitic suggesting intra-abdominal inflammation. The distal stomach and proximal duodenum appear somewhat thickened, favor peptic ulcer disease over cholecystitis. No pneumoperitoneum seen. If persisting symptoms, suggest postcontrast CT. Cholelithiasis. Electronically Signed   By:  Tiburcio Pea M.D.   On: 08/04/2023 08:07   DG Chest Port 1 View Result Date: 08/04/2023 CLINICAL DATA:  Shortness of breath and hypertension EXAM: PORTABLE CHEST 1 VIEW COMPARISON:  03/22/2018 FINDINGS: Normal heart size and mediastinal contours. No acute infiltrate or edema. No effusion or pneumothorax. No acute osseous findings. Artifact from EKG leads. IMPRESSION: No evidence of active disease. Electronically Signed   By: Tiburcio Pea M.D.   On:  08/04/2023 06:38     PERTINENT LAB RESULTS: CBC: Recent Labs    08/04/23 0524 08/05/23 0418  WBC 19.7* 16.9*  HGB 12.0 12.3  HCT 36.1 36.2  PLT 316 311   CMET CMP     Component Value Date/Time   NA 139 08/05/2023 0418   NA 139 03/05/2023 1031   K 4.1 08/05/2023 0418   CL 104 08/05/2023 0418   CO2 23 08/05/2023 0418   GLUCOSE 125 (H) 08/05/2023 0418   BUN 30 (H) 08/05/2023 0418   BUN 42 (H) 03/05/2023 1031   CREATININE 2.24 (H) 08/05/2023 0418   CALCIUM 8.9 08/05/2023 0418   PROT 6.8 08/05/2023 0418   PROT 7.5 03/05/2023 1031   ALBUMIN 2.9 (L) 08/05/2023 0418   ALBUMIN 4.5 03/05/2023 1031   AST 95 (H) 08/05/2023 0418   ALT 59 (H) 08/05/2023 0418   ALKPHOS 76 08/05/2023 0418   BILITOT 1.1 08/05/2023 0418   BILITOT 0.3 03/05/2023 1031   EGFR 28.0 06/28/2023 0944   EGFR 22 (L) 03/05/2023 1031   GFRNONAA 22 (L) 08/05/2023 0418    GFR Estimated Creatinine Clearance: 18.7 mL/min (A) (by C-G formula based on SCr of 2.24 mg/dL (H)). Recent Labs    08/04/23 0524  LIPASE 37   No results for input(s): "CKTOTAL", "CKMB", "CKMBINDEX", "TROPONINI" in the last 72 hours. Invalid input(s): "POCBNP" No results for input(s): "DDIMER" in the last 72 hours. No results for input(s): "HGBA1C" in the last 72 hours. No results for input(s): "CHOL", "HDL", "LDLCALC", "TRIG", "CHOLHDL", "LDLDIRECT" in the last 72 hours. No results for input(s): "TSH", "T4TOTAL", "T3FREE", "THYROIDAB" in the last 72 hours.  Invalid input(s): "FREET3" No results for input(s): "VITAMINB12", "FOLATE", "FERRITIN", "TIBC", "IRON", "RETICCTPCT" in the last 72 hours. Coags: No results for input(s): "INR" in the last 72 hours.  Invalid input(s): "PT" Microbiology: No results found for this or any previous visit (from the past 240 hours).  FURTHER DISCHARGE INSTRUCTIONS:  Get Medicines reviewed and adjusted: Please take all your medications with you for your next visit with your Primary  MD  Laboratory/radiological data: Please request your Primary MD to go over all hospital tests and procedure/radiological results at the follow up, please ask your Primary MD to get all Hospital records sent to his/her office.  In some cases, they will be blood work, cultures and biopsy results pending at the time of your discharge. Please request that your primary care M.D. goes through all the records of your hospital data and follows up on these results.  Also Note the following: If you experience worsening of your admission symptoms, develop shortness of breath, life threatening emergency, suicidal or homicidal thoughts you must seek medical attention immediately by calling 911 or calling your MD immediately  if symptoms less severe.  You must read complete instructions/literature along with all the possible adverse reactions/side effects for all the Medicines you take and that have been prescribed to you. Take any new Medicines after you have completely understood and accpet all the possible adverse reactions/side effects.  Do not drive when taking Pain medications or sleeping medications (Benzodaizepines)  Do not take more than prescribed Pain, Sleep and Anxiety Medications. It is not advisable to combine anxiety,sleep and pain medications without talking with your primary care practitioner  Special Instructions: If you have smoked or chewed Tobacco  in the last 2 yrs please stop smoking, stop any regular Alcohol  and or any Recreational drug use.  Wear Seat belts while driving.  Please note: You were cared for by a hospitalist during your hospital stay. Once you are discharged, your primary care physician will handle any further medical issues. Please note that NO REFILLS for any discharge medications will be authorized once you are discharged, as it is imperative that you return to your primary care physician (or establish a relationship with a primary care physician if you do not have  one) for your post hospital discharge needs so that they can reassess your need for medications and monitor your lab values.  Total Time spent coordinating discharge including counseling, education and face to face time equals greater than 30 minutes.  SignedJeoffrey Massed 08/05/2023 1:43 PM

## 2023-08-05 NOTE — Care Management CC44 (Signed)
 Condition Code 44 Documentation Completed  Patient Details  Name: MAHASIN RIVIERE MRN: [id number] Date of Birth: 1945/09/07   Condition Code 44 given:  Yes Patient signature on Condition Code 44 notice:  Yes Documentation of 2 MD's agreement:  Yes Code 44 added to claim:  Yes    Gordy Clement, RN 08/05/2023, 2:35 PM

## 2023-08-05 NOTE — Care Management Obs Status (Signed)
 MEDICARE OBSERVATION STATUS NOTIFICATION   Patient Details  Name: Jasmine Dawson MRN: [id number] Date of Birth: 07-Apr-1946   Medicare Observation Status Notification Given:       Gordy Clement, RN 08/05/2023, 2:35 PM

## 2023-08-11 ENCOUNTER — Ambulatory Visit: Payer: Medicare Other | Admitting: Cardiology

## 2023-08-11 DIAGNOSIS — I480 Paroxysmal atrial fibrillation: Secondary | ICD-10-CM

## 2023-08-11 NOTE — Progress Notes (Deleted)
  Electrophysiology Office Note:   Date:  08/11/2023  ID:  NEOMIA HERBEL, DOB 07-22-1945, MRN [id number]  Primary Cardiologist: Avery Bodo, MD Primary Heart Failure: None Electrophysiologist: Farouk Vivero [place] Ding, MD  {Click to update primary MD,subspecialty MD or APP then REFRESH:1}    History of Present Illness:   Jasmine Dawson is a 78 y.o. female with h/o CKD stage III, hypertension, atrial fibrillation, CVA seen today for routine electrophysiology followup.   Since last being seen in our clinic the patient reports doing ***.  she denies chest pain, palpitations, dyspnea, PND, orthopnea, nausea, vomiting, dizziness, syncope, edema, weight gain, or early satiety.   Review of systems complete and found to be negative unless listed in HPI.   EP Information / Studies Reviewed:    {EKGtoday:28818}        Risk Assessment/Calculations:    CHA2DS2-VASc Score = 6  {Confirm score is correct.  If not, click here to update score.  REFRESH note.  :1} This indicates a 9.7% annual risk of stroke. The patient's score is based upon: CHF History: 0 HTN History: 1 Diabetes History: 0 Stroke History: 2 Vascular Disease History: 0 Age Score: 2 Gender Score: 1   {This patient has a significant risk of stroke if diagnosed with atrial fibrillation.  Please consider VKA or DOAC agent for anticoagulation if the bleeding risk is acceptable.   You can also use the SmartPhrase .[email or web address] for documentation.   :[id number]}         Physical Exam:   VS:  There were no vitals taken for this visit.   Wt Readings from Last 3 Encounters:  08/04/23 162 lb 11.2 oz (73.8 kg)  04/27/23 167 lb 9.6 oz (76 kg)  03/24/23 162 lb 6.4 oz (73.7 kg)     GEN: Well nourished, well developed in no acute distress NECK: No JVD; No carotid bruits CARDIAC: {EPRHYTHM:28826}, no murmurs, rubs, gallops RESPIRATORY:  Clear to auscultation without rales, wheezing or rhonchi  ABDOMEN: Soft, non-tender,  non-distended EXTREMITIES:  No edema; No deformity   ASSESSMENT AND PLAN:    1.  Paroxysmal atrial fibrillation: On atenolol.  Remains in sinus rhythm.  No acute complaints.***  2.  Second hypercoagulable state: Currently on Eliquis for atrial fibrillation  3.  Hypertension:***  Follow up with {QMVHQ:46962} {EPFOLLOW XB:28413}  Signed, Demia Viera [place] Ding, MD

## 2023-08-17 ENCOUNTER — Inpatient Hospital Stay: Admitting: Oncology

## 2023-08-18 ENCOUNTER — Other Ambulatory Visit: Payer: Self-pay | Admitting: Cardiology

## 2023-08-27 ENCOUNTER — Other Ambulatory Visit: Payer: Self-pay | Admitting: Cardiology

## 2023-08-27 DIAGNOSIS — I480 Paroxysmal atrial fibrillation: Secondary | ICD-10-CM

## 2023-08-27 NOTE — Telephone Encounter (Signed)
 Prescription refill request for Eliquis  received. Indication: afib  Last office visit: Dunn, 03/05/2023 Scr: 2.24, 08/05/2022 Age: 78 yo  Weight: 73.8 kg   Per dosing criteria pt qualifies for a dose decrease. Pt has an appointment coming on 5/8 with Dr. Lawana Pray.

## 2023-09-02 NOTE — Progress Notes (Unsigned)
  Electrophysiology Office Note:   Date:  09/02/2023  ID:  Jasmine Dawson, DOB Feb 18, 1946, MRN [id number]  Primary Cardiologist: Avery Bodo, MD Primary Heart Failure: None Electrophysiologist: Caress Reffitt Cortland Ding, MD  {Click to update primary MD,subspecialty MD or APP then REFRESH:1}    History of Present Illness:   Jasmine Dawson is a 78 y.o. female with h/o CKD stage III, hypertension, atrial fibrillation, CVA seen today for routine electrophysiology followup.   Since last being seen in our clinic the patient reports doing ***.  she denies chest pain, palpitations, dyspnea, PND, orthopnea, nausea, vomiting, dizziness, syncope, edema, weight gain, or early satiety.   Review of systems complete and found to be negative unless listed in HPI.   EP Information / Studies Reviewed:    {EKGtoday:28818}        Risk Assessment/Calculations:    CHA2DS2-VASc Score = 6  {Confirm score is correct.  If not, click here to update score.  REFRESH note.  :1} This indicates a 9.7% annual risk of stroke. The patient's score is based upon: CHF History: 0 HTN History: 1 Diabetes History: 0 Stroke History: 2 Vascular Disease History: 0 Age Score: 2 Gender Score: 1   {This patient has a significant risk of stroke if diagnosed with atrial fibrillation.  Please consider VKA or DOAC agent for anticoagulation if the bleeding risk is acceptable.   You can also use the SmartPhrase .[email or web address] for documentation.   :[id number]}         Physical Exam:   VS:  There were no vitals taken for this visit.   Wt Readings from Last 3 Encounters:  08/04/23 162 lb 11.2 oz (73.8 kg)  04/27/23 167 lb 9.6 oz (76 kg)  03/24/23 162 lb 6.4 oz (73.7 kg)     GEN: Well nourished, well developed in no acute distress NECK: No JVD; No carotid bruits CARDIAC: {EPRHYTHM:28826}, no murmurs, rubs, gallops RESPIRATORY:  Clear to auscultation without rales, wheezing or rhonchi  ABDOMEN: Soft, non-tender,  non-distended EXTREMITIES:  No edema; No deformity   ASSESSMENT AND PLAN:    1.  Paroxysmal atrial fibrillation:***  2.  Secondary hypercoagulable state: On Eliquis  for atrial fibrillation  3.  Cryptogenic stroke: Post ICD implant with explant.  Atrial fibrillation discovered on ILR.  Now on Eliquis .  Follow up with {VHQIO:96295} {EPFOLLOW MW:41324}  Signed, Denisia Harpole Cortland Ding, MD

## 2023-09-03 ENCOUNTER — Ambulatory Visit: Attending: Cardiology | Admitting: Cardiology

## 2023-09-03 ENCOUNTER — Encounter: Payer: Self-pay | Admitting: Cardiology

## 2023-09-03 VITALS — BP 180/88 | HR 62 | Ht 61.0 in | Wt 153.0 lb

## 2023-09-03 DIAGNOSIS — I10 Essential (primary) hypertension: Secondary | ICD-10-CM | POA: Insufficient documentation

## 2023-09-03 DIAGNOSIS — D6869 Other thrombophilia: Secondary | ICD-10-CM | POA: Diagnosis present

## 2023-09-03 DIAGNOSIS — I480 Paroxysmal atrial fibrillation: Secondary | ICD-10-CM | POA: Diagnosis present

## 2023-09-03 DIAGNOSIS — I639 Cerebral infarction, unspecified: Secondary | ICD-10-CM | POA: Insufficient documentation

## 2023-09-03 MED ORDER — APIXABAN 2.5 MG PO TABS: 2.5000 mg | ORAL_TABLET | Freq: Two times a day (BID) | ORAL | 11 refills | Status: AC

## 2023-09-03 NOTE — Patient Instructions (Addendum)
 Medication Instructions:  Your physician has recommended you make the following change in your medication:  DECREASE Eliquis  to 2.5 mg twice daily  *If you need a refill on your cardiac medications before your next appointment, please call your pharmacy*  Lab Work: None ordered  If you have any lab test that is abnormal or we need to change your treatment, we will call you to review the results.  Testing/Procedures: None ordered  Follow-Up: At [facility], you and your health needs are our priority.  As part of our continuing mission to provide you with exceptional heart care, our providers are all part of one team.  This team includes your primary Cardiologist (physician) and Advanced Practice Providers or APPs (Physician Assistants and Nurse Practitioners) who all work together to provide you with the care you need, when you need it.  Your next appointment:   1 year(s)  Provider:   You will see one of the following Advanced Practice Providers on your designated Care Team:   Mertha Abrahams, [place] "Jonelle Neri" [place], [place] Creighton Doffing, NP   Thank you for choosing [facility]!!   Reece Cane, RN [phone]

## 2023-09-04 ENCOUNTER — Other Ambulatory Visit: Payer: Self-pay | Admitting: Oncology

## 2023-09-04 DIAGNOSIS — D472 Monoclonal gammopathy: Secondary | ICD-10-CM

## 2023-09-07 ENCOUNTER — Inpatient Hospital Stay: Admitting: Oncology

## 2023-09-14 ENCOUNTER — Inpatient Hospital Stay: Attending: Oncology | Admitting: Oncology

## 2023-09-14 ENCOUNTER — Inpatient Hospital Stay

## 2023-09-14 ENCOUNTER — Encounter: Payer: Self-pay | Admitting: Oncology

## 2023-09-14 VITALS — BP 171/72 | HR 61 | Temp 98.2°F | Resp 18 | Ht 61.0 in | Wt 155.1 lb

## 2023-09-14 DIAGNOSIS — I272 Pulmonary hypertension, unspecified: Secondary | ICD-10-CM | POA: Diagnosis not present

## 2023-09-14 DIAGNOSIS — D6851 Activated protein C resistance: Secondary | ICD-10-CM | POA: Diagnosis not present

## 2023-09-14 DIAGNOSIS — C9000 Multiple myeloma not having achieved remission: Secondary | ICD-10-CM | POA: Diagnosis present

## 2023-09-14 DIAGNOSIS — D472 Monoclonal gammopathy: Secondary | ICD-10-CM

## 2023-09-14 DIAGNOSIS — D649 Anemia, unspecified: Secondary | ICD-10-CM | POA: Diagnosis not present

## 2023-09-14 DIAGNOSIS — I480 Paroxysmal atrial fibrillation: Secondary | ICD-10-CM | POA: Insufficient documentation

## 2023-09-14 DIAGNOSIS — R768 Other specified abnormal immunological findings in serum: Secondary | ICD-10-CM | POA: Diagnosis not present

## 2023-09-14 DIAGNOSIS — N1832 Chronic kidney disease, stage 3b: Secondary | ICD-10-CM | POA: Diagnosis not present

## 2023-09-14 DIAGNOSIS — I129 Hypertensive chronic kidney disease with stage 1 through stage 4 chronic kidney disease, or unspecified chronic kidney disease: Secondary | ICD-10-CM | POA: Insufficient documentation

## 2023-09-14 DIAGNOSIS — N184 Chronic kidney disease, stage 4 (severe): Secondary | ICD-10-CM | POA: Insufficient documentation

## 2023-09-14 DIAGNOSIS — Z79899 Other long term (current) drug therapy: Secondary | ICD-10-CM | POA: Diagnosis not present

## 2023-09-14 DIAGNOSIS — Z7901 Long term (current) use of anticoagulants: Secondary | ICD-10-CM | POA: Insufficient documentation

## 2023-09-14 DIAGNOSIS — Z8673 Personal history of transient ischemic attack (TIA), and cerebral infarction without residual deficits: Secondary | ICD-10-CM | POA: Diagnosis not present

## 2023-09-14 DIAGNOSIS — I071 Rheumatic tricuspid insufficiency: Secondary | ICD-10-CM | POA: Diagnosis not present

## 2023-09-14 DIAGNOSIS — I10 Essential (primary) hypertension: Secondary | ICD-10-CM

## 2023-09-14 DIAGNOSIS — E785 Hyperlipidemia, unspecified: Secondary | ICD-10-CM | POA: Diagnosis not present

## 2023-09-14 DIAGNOSIS — D6852 Prothrombin gene mutation: Secondary | ICD-10-CM | POA: Insufficient documentation

## 2023-09-14 LAB — CBC WITH DIFFERENTIAL (CANCER CENTER ONLY)
Abs Immature Granulocytes: 0.01 10*3/uL (ref 0.00–0.07)
Basophils Absolute: 0 10*3/uL (ref 0.0–0.1)
Basophils Relative: 1 %
Eosinophils Absolute: 0.2 10*3/uL (ref 0.0–0.5)
Eosinophils Relative: 4 %
HCT: 34.4 % — ABNORMAL LOW (ref 36.0–46.0)
Hemoglobin: 11.1 g/dL — ABNORMAL LOW (ref 12.0–15.0)
Immature Granulocytes: 0 %
Lymphocytes Relative: 43 %
Lymphs Abs: 2.9 10*3/uL (ref 0.7–4.0)
MCH: 28.2 pg (ref 26.0–34.0)
MCHC: 32.3 g/dL (ref 30.0–36.0)
MCV: 87.5 fL (ref 80.0–100.0)
Monocytes Absolute: 0.5 10*3/uL (ref 0.1–1.0)
Monocytes Relative: 7 %
Neutro Abs: 3 10*3/uL (ref 1.7–7.7)
Neutrophils Relative %: 45 %
Platelet Count: 258 10*3/uL (ref 150–400)
RBC: 3.93 MIL/uL (ref 3.87–5.11)
RDW: 15.3 % (ref 11.5–15.5)
WBC Count: 6.7 10*3/uL (ref 4.0–10.5)
nRBC: 0 % (ref 0.0–0.2)

## 2023-09-14 LAB — CMP (CANCER CENTER ONLY)
ALT: 10 U/L (ref 0–44)
AST: 36 U/L (ref 15–41)
Albumin: 4.4 g/dL (ref 3.5–5.0)
Alkaline Phosphatase: 98 U/L (ref 38–126)
Anion gap: 13 (ref 5–15)
BUN: 28 mg/dL — ABNORMAL HIGH (ref 8–23)
CO2: 25 mmol/L (ref 22–32)
Calcium: 10 mg/dL (ref 8.9–10.3)
Chloride: 100 mmol/L (ref 98–111)
Creatinine: 1.92 mg/dL — ABNORMAL HIGH (ref 0.44–1.00)
GFR, Estimated: 26 mL/min — ABNORMAL LOW (ref >60)
Glucose, Bld: 95 mg/dL (ref 70–99)
Potassium: 4 mmol/L (ref 3.5–5.1)
Sodium: 138 mmol/L (ref 135–145)
Total Bilirubin: 0.3 mg/dL (ref 0.0–1.2)
Total Protein: 8.3 g/dL — ABNORMAL HIGH (ref 6.5–8.1)

## 2023-09-14 LAB — LACTATE DEHYDROGENASE: LDH: 187 U/L (ref 98–192)

## 2023-09-14 NOTE — Progress Notes (Deleted)
 [facility]  HEMATOLOGY CLINIC PROGRESS NOTE  PATIENT NAME: Jasmine Dawson   MR#: [id number] DOB: May 18, 1945  Patient Care Team: Danella Dunn, MD as PCP - General (Family Medicine) Lucendia Rusk, MD as PCP - Cardiology (Cardiology) Lei Pump, MD as PCP - Electrophysiology (Cardiology) Charley Constable, MD as Consulting Physician (Nephrology)  Date of visit: 09/14/2023   ASSESSMENT & PLAN:   Jasmine Dawson is a 78 y.o. ***   No problem-specific Assessment & Plan notes found for this encounter.   Assessment and Plan Assessment & Plan       I spent a total of {CHL ONC TIME VISIT - WUJWJ:[id number]} during this encounter with the patient including review of chart and various tests results, discussions about plan of care and coordination of care plan.  I reviewed lab results and outside records for this visit and discussed relevant results with the patient. Diagnosis, plan of care and treatment options were also discussed in detail with the patient. Opportunity provided to ask questions and answers provided to her apparent satisfaction. Provided instructions to call our clinic with any problems, questions or concerns prior to return visit. I recommended to continue follow-up with PCP and sub-specialists. She verbalized understanding and agreed with the plan. No barriers to learning was detected.  [name], MD  09/14/2023 3:33 PM  [facility] - A DEPT OF [facility] [address] [place] [address] Dept: [phone] Dept Fax: [phone]   CHIEF COMPLAINT/ REASON FOR VISIT:  ***  INTERVAL HISTORY:  Discussed the use of AI scribe software for clinical note transcription with the patient, who gave verbal consent to proceed.  History of Present Illness     ***  SUMMARY OF HEMATOLOGIC HISTORY:  ***  I have reviewed the past medical history,  past surgical history, social history and family history with the patient and they are unchanged from previous note.  ALLERGIES: She is allergic to cefdinir, egg-derived products, orange fruit [citrus], prednisone, sulfa antibiotics, tomato, atorvastatin, influenza vaccines, aspartame and phenylalanine, caffeine, and shellfish allergy.  MEDICATIONS:  Current Outpatient Medications  Medication Sig Dispense Refill   [START ON 10/02/2023] apixaban  (ELIQUIS ) 2.5 MG TABS tablet Take 1 tablet (2.5 mg total) by mouth 2 (two) times daily. 60 tablet 11   atenolol  (TENORMIN ) 50 MG tablet Take 50 mg by mouth daily.     triamterene -hydrochlorothiazide (DYAZIDE) 37.5-25 MG capsule Take 1 capsule by mouth daily.     No current facility-administered medications for this visit.     REVIEW OF SYSTEMS:    Review of Systems - Oncology  All other pertinent systems were reviewed with the patient and are negative.  PHYSICAL EXAMINATION:    Onc Performance Status - 09/14/23 1500       ECOG Perf Status   ECOG Perf Status Fully active, able to carry on all pre-disease performance without restriction      KPS SCALE   KPS % SCORE Normal, no compliants, no evidence of disease             Vitals:   09/14/23 1449 09/14/23 1450  BP: (!) 186/71 (!) 171/72  Pulse: 61   Resp: 18   Temp: 98.2 F (36.8 C)   SpO2: 100%    Filed Weights   09/14/23 1449  Weight: 155 lb 1.6 oz (70.4 kg)    Physical Exam Constitutional:      General: She is  not in acute distress.    Appearance: Normal appearance.  HENT:     Head: Normocephalic and atraumatic.  Cardiovascular:     Rate and Rhythm: Normal rate.     Heart sounds: Normal heart sounds.  Pulmonary:     Effort: Pulmonary effort is normal. No respiratory distress.     Breath sounds: Normal breath sounds.  Abdominal:     General: There is no distension.  Neurological:     General: No focal deficit present.     Mental Status: She is alert and oriented  to person, place, and time.  Psychiatric:        Mood and Affect: Mood normal.        Behavior: Behavior normal.     LABORATORY DATA:   I have reviewed the data as listed.  Results for orders placed or performed in visit on 09/14/23  Lactate dehydrogenase  Result Value Ref Range   LDH 187 98 - 192 U/L  CMP (Cancer Center only)  Result Value Ref Range   Sodium 138 135 - 145 mmol/L   Potassium 4.0 3.5 - 5.1 mmol/L   Chloride 100 98 - 111 mmol/L   CO2 25 22 - 32 mmol/L   Glucose, Bld 95 70 - 99 mg/dL   BUN 28 (H) 8 - 23 mg/dL   Creatinine 1.61 (H) 0.96 - 1.00 mg/dL   Calcium  10.0 8.9 - 10.3 mg/dL   Total Protein 8.3 (H) 6.5 - 8.1 g/dL   Albumin  4.4 3.5 - 5.0 g/dL   AST 36 15 - 41 U/L   ALT 10 0 - 44 U/L   Alkaline Phosphatase 98 38 - 126 U/L   Total Bilirubin 0.3 0.0 - 1.2 mg/dL   GFR, Estimated 26 (L) >60 mL/min   Anion gap 13 5 - 15  CBC with Differential (Cancer Center Only)  Result Value Ref Range   WBC Count 6.7 4.0 - 10.5 K/uL   RBC 3.93 3.87 - 5.11 MIL/uL   Hemoglobin 11.1 (L) 12.0 - 15.0 g/dL   HCT 04.5 (L) 40.9 - 81.1 %   MCV 87.5 80.0 - 100.0 fL   MCH 28.2 26.0 - 34.0 pg   MCHC 32.3 30.0 - 36.0 g/dL   RDW 91.4 78.2 - 95.6 %   Platelet Count 258 150 - 400 K/uL   nRBC 0.0 0.0 - 0.2 %   Neutrophils Relative % 45 %   Neutro Abs 3.0 1.7 - 7.7 K/uL   Lymphocytes Relative 43 %   Lymphs Abs 2.9 0.7 - 4.0 K/uL   Monocytes Relative 7 %   Monocytes Absolute 0.5 0.1 - 1.0 K/uL   Eosinophils Relative 4 %   Eosinophils Absolute 0.2 0.0 - 0.5 K/uL   Basophils Relative 1 %   Basophils Absolute 0.0 0.0 - 0.1 K/uL   Immature Granulocytes 0 %   Abs Immature Granulocytes 0.01 0.00 - 0.07 K/uL    RADIOGRAPHIC STUDIES:  No recent pertinent imaging studies available to review.  Orders Placed This Encounter  Procedures   CBC with Differential (Cancer Center Only)    Standing Status:   Future    Expected Date:   03/16/2024    Expiration Date:   09/13/2024   CMP  (Cancer Center only)    Standing Status:   Future    Expected Date:   03/16/2024    Expiration Date:   09/13/2024   Iron and TIBC    Standing Status:   Future  Expected Date:   03/16/2024    Expiration Date:   09/13/2024   Ferritin    Standing Status:   Future    Expected Date:   03/16/2024    Expiration Date:   09/13/2024   Multiple Myeloma Panel (SPEP&IFE w/QIG)    Standing Status:   Future    Expected Date:   03/16/2024    Expiration Date:   09/13/2024   Kappa/lambda light chains    Standing Status:   Future    Expected Date:   03/16/2024    Expiration Date:   09/13/2024     No future appointments.   This document was completed utilizing speech recognition software. Grammatical errors, random word insertions, pronoun errors, and incomplete sentences are an occasional consequence of this system due to software limitations, ambient noise, and hardware issues. Any formal questions or concerns about the content, text or information contained within the body of this dictation should be directly addressed to the provider for clarification.

## 2023-09-14 NOTE — Assessment & Plan Note (Signed)
-  Noted on workup for CVA in 2019.  She has been on chronic anticoagulation with Eliquis given her paroxysmal A-fib and history of CVA.  She will need indefinite anticoagulation.

## 2023-09-14 NOTE — Assessment & Plan Note (Addendum)
 On 02/25/2023, labs showed creatinine of 2.11, calcium  of 9.7, total protein 7.6 g/dL, albumin  4.6 g/dL.  Hemoglobin 11.6, MCV 86.  White count and platelet count were normal.  SPEP showed 0.9 g/dL of M spike.  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  Free kappa was 50.5 mg/L, lambda was 34.4 mg/L, ratio normal at 1.47.  Quantitative IgG was slightly elevated at 1639 mg/dL when the cutoff was 1610.  Quantitative IgA and IgM are within normal limits.  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   On her initial consultation with us  on 04/27/2023, we pursued additional workup.  Hemoglobin was close to normal at 11.4.  White count and platelet count were within normal limits.  Creatinine improved at 1.79, calcium  normal at 10.9, otherwise unremarkable CMP.  SPEP showed 0.7 g/dL of M protein.  IFE showed to be IgG lambda type.  Quantitative immunoglobulins were grossly within normal limits except for mildly elevated IgG of 1642 mg/dL.  Both kappa and lambda light chains were elevated (44.9 mg/L and 32 mg/L respectively), consistent with CKD picture, ratio was normal at 1.4.  Beta-2  microglobulin was elevated at 4.3 mg/L.  LDH normal.  24-hour urine protein electrophoresis showed 4.6 mg/dL of total protein, out of which 9.3% was M protein, amounting to 4 mg over 24 hours.  Small amount.  IFE on urine showed it to be IgG lambda protein.  X-ray skeletal survey on 04/27/2023 showed no lytic lesions.  Reviewed myeloma labs from 07/13/2023.  M protein was stable at 0.7 g/dL.  Both kappa and lambda were elevated with normal ratio, consistent with CKD picture.  Repeat myeloma labs are pending from today.  Creatinine overall stable at 1.92.  Hemoglobin slightly low at 11.1.  Will monitor this.  Calcium  normal at 10.  No progression to multiple myeloma, bone damage, hypercalcemia, or worsening anemia below 10.  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 She needs close surveillance with repeat labs every 3-6 months initially and if stable labs, we will space out clinic intervals.   RTC in 6 months with labs 2 weeks prior to return visit.

## 2023-09-14 NOTE — Assessment & Plan Note (Signed)
 Blood pressure is well-controlled at home, but increases in medical settings (white coat hypertension).  Resumed antihypertensive therapy after recent food poisoning. Actively managing diet to control weight and blood pressure.  -Continue monitoring blood pressure at home.

## 2023-09-14 NOTE — Progress Notes (Signed)
 [facility]  HEMATOLOGY CLINIC PROGRESS NOTE  PATIENT NAME: Jasmine Dawson   MR#: [id number] DOB: [birth year]  Patient Care Team: Danella Dunn, MD as PCP - General (Family Medicine) Lucendia Rusk, MD as PCP - Cardiology (Cardiology) Lei Pump, MD as PCP - Electrophysiology (Cardiology) Charley Constable, MD as Consulting Physician (Nephrology)  Date of visit: 09/14/2023   ASSESSMENT & PLAN:   Jasmine Dawson is a 78 y.o. lady with a past medical history of CKD stage IV, heterozygous factor V Leiden mutation, prothrombin gene mutation, hyperlipidemia, hypertension, paroxysmal A-fib on Eliquis , pulmonary hypertension, sinus bradycardia, Hx. of stroke, DVT, SVT, and tricuspid regurgitation was referred to our service in December 2024 for evaluation of monoclonal gammopathy.  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  Monoclonal gammopathy of unknown significance On 02/25/2023, labs showed creatinine of 2.11, calcium  of 9.7, total protein 7.6 g/dL, albumin  4.6 g/dL.  Hemoglobin 11.6, MCV 86.  White count and platelet count were normal.  SPEP showed 0.9 g/dL of M spike.  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  Free kappa was 50.5 mg/L, lambda was 34.4 mg/L, ratio normal at 1.47.  Quantitative IgG was slightly elevated at 1639 mg/dL when the cutoff was 8295.  Quantitative IgA and IgM are within normal limits.  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   On her initial consultation with us  on 04/27/2023, we pursued additional workup.  Hemoglobin was close to normal at 11.4.  White count and platelet count were within normal limits.  Creatinine improved at 1.79, calcium  normal at 10.9, otherwise unremarkable CMP.  SPEP showed 0.7 g/dL of M protein.  IFE showed to be IgG lambda type.  Quantitative immunoglobulins were grossly within normal limits except for mildly elevated  IgG of 1642 mg/dL.  Both kappa and lambda light chains were elevated (44.9 mg/L and 32 mg/L respectively), consistent with CKD picture, ratio was normal at 1.4.  Beta-2  microglobulin was elevated at 4.3 mg/L.  LDH normal.  24-hour urine protein electrophoresis showed 4.6 mg/dL of total protein, out of which 9.3% was M protein, amounting to 4 mg over 24 hours.  Small amount.  IFE on urine showed it to be IgG lambda protein.  X-ray skeletal survey on 04/27/2023 showed no lytic lesions.  Reviewed myeloma labs from 07/13/2023.  M protein was stable at 0.7 g/dL.  Both kappa and lambda were elevated with normal ratio, consistent with CKD picture.  Repeat myeloma labs are pending from today.  Creatinine overall stable at 1.92.  Hemoglobin slightly low at 11.1.  Will monitor this.  Calcium  normal at 10.  No progression to multiple myeloma, bone damage, hypercalcemia, or worsening anemia below 10.  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 She needs close surveillance with repeat labs every 3-6 months initially and if stable labs, we will space out clinic intervals.   RTC in 6 months with labs 2 weeks prior to return visit.  Chronic kidney disease, stage 3b (HCC) Chronic kidney disease with well-maintained kidney function. Creatinine level was 1.94 in March and is 1.92 today, after elevation to 2.24 during recent infection-related hospitalization. Other electrolytes and liver function tests are normal.  Prothrombin gene mutation (HCC) -Noted on workup for CVA in 2019.  She has been on chronic anticoagulation with Eliquis  given her paroxysmal A-fib and history of CVA.  She will need indefinite anticoagulation.  Heterozygous factor V Leiden mutation (HCC) -Noted on  workup for CVA in 2019.  She has been on chronic anticoagulation with Eliquis  given her paroxysmal A-fib and history of CVA.  She will need indefinite anticoagulation.  Essential hypertension Blood pressure is well-controlled at home, but  increases in medical settings (white coat hypertension).  Resumed antihypertensive therapy after recent food poisoning. Actively managing diet to control weight and blood pressure.  -Continue monitoring blood pressure at home.   I spent a total of 25 minutes during this encounter with the patient including review of chart and various tests results, discussions about plan of care and coordination of care plan.  I reviewed lab results and outside records for this visit and discussed relevant results with the patient. Diagnosis, plan of care and treatment options were also discussed in detail with the patient. Opportunity provided to ask questions and answers provided to her apparent satisfaction. Provided instructions to call our clinic with any problems, questions or concerns prior to return visit. I recommended to continue follow-up with PCP and sub-specialists. She verbalized understanding and agreed with the plan. No barriers to learning was detected.  Jasmine Berber, MD  09/14/2023 3:40 PM  [facility] - A DEPT OF [facility] [id number] [place] [address] Dept: [phone] Dept Fax: [phone]   CHIEF COMPLAINT/ REASON FOR VISIT:  Follow-up for IgG lambda MGUS.  On surveillance.  INTERVAL HISTORY:  Discussed the use of AI scribe software for clinical note transcription with the patient, who gave verbal consent to proceed.  History of Present Illness Jasmine Dawson "Jasmine Dawson" is an 78 year old female with chronic kidney disease and MGUS who presents for follow-up of her hematologic condition. She was referred for evaluation of an abnormal protein in the blood, initially to rule out multiple myeloma.  She has a history of monoclonal gammopathy of unknown significance (MGUS) identified during a workup for abnormal protein in the blood. Previous investigations, including full-body x-rays, showed  arthritis-type changes but no bone damage. There is no evidence of progression to multiple myeloma, and no associated kidney damage, anemia, or bone damage. Her condition remains stable with no current intervention required.  She experienced a recent episode of food poisoning on August 04, 2023, which led to hospitalization for a couple of days. During this time, she had severe symptoms including inability to speak, difficulty breathing, and her body 'shutting down'. She has since recovered with no lingering symptoms such as diarrhea, nausea, or vomiting. Her creatinine level was elevated at 2.24 during hospitalization; her baseline is 1.92.  She is on lifelong anticoagulation therapy with Eliquis  due to prothrombin gene mutation and factor V Leiden mutation, which increase her risk of blood clots. She experienced a mild stroke in 2019, which led to the discovery of these mutations.  She has a history of atrial fibrillation, which can be triggered by stress. She reports an episode of elevated blood pressure and stress earlier today due to a mix-up with her husband's medical appointment.  She has recently changed her diet significantly, which initially allowed her to stop her blood pressure medication. However, she resumed the medication following the recent food poisoning episode.   SUMMARY OF HEMATOLOGIC HISTORY:  She has had regular follow-up with her nephrologist for her history of CKD.  Creatinine has been slowly worsening recently.   On 02/25/2023, labs showed creatinine of 2.11, calcium  of 9.7, total protein 7.6 g/dL, albumin  4.6 g/dL.  Hemoglobin 11.6, MCV 86.  White count  and platelet count were normal.  SPEP showed 0.9 g/dL of M spike.  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  Free kappa was 50.5 mg/L, lambda was 34.4 mg/L, ratio normal at 1.47.  Quantitative IgG was slightly elevated at 1639 mg/dL when the cutoff was 5784.  Quantitative IgA and IgM are within normal limits.  Random urine  immunoelectrophoresis showed IgG monoclonal protein with lambda light chain specificity, Bence-Jones protein positive, lambda type.   With these findings, she was referred to us  for evaluation of monoclonal gammopathy.   The patient reports that her kidney function has been satisfactory, with no issues related to urination. She was recently hospitalized for abdominal pain, initially suspected to be gallbladder-related, but this was later ruled out. The patient's hypertension is currently not being managed with medication due to a previous incident where the medication caused her heart rate to drop too low. However, she reports that her blood pressure is generally well-controlled at home. The patient also has a history of a blood clot that led to a minor stroke. She is currently on Eliquis , a blood thinner, to prevent future clots.    On her initial consultation with us  on 04/27/2023, we pursued additional workup.  Hemoglobin was close to normal at 11.4.  White count and platelet count were within normal limits.  Creatinine improved at 1.79, calcium  normal at 10.9, otherwise unremarkable CMP.  SPEP showed 0.7 g/dL of M protein.  IFE showed to be IgG lambda type.  Quantitative immunoglobulins were grossly within normal limits except for mildly elevated IgG of 1642 mg/dL.  Both kappa and lambda light chains were elevated (44.9 mg/L and 32 mg/L respectively), consistent with CKD picture, ratio was normal at 1.4.  Beta-2  microglobulin was elevated at 4.3 mg/L.  LDH normal.  24-hour urine protein electrophoresis showed 4.6 mg/dL of total protein, out of which 9.3% was M protein, amounting to 4 mg over 24 hours.  Small amount.  IFE on urine showed it to be IgG lambda protein.   X-ray skeletal survey on 04/27/2023 showed no lytic lesions.   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 She needs close surveillance with repeat labs every 3-6 months initially and if stable labs, we will space out clinic intervals.   I  have reviewed the past medical history, past surgical history, social history and family history with the patient and they are unchanged from previous note.  ALLERGIES: She is allergic to cefdinir, egg-derived products, orange fruit [citrus], prednisone, sulfa antibiotics, tomato, atorvastatin, influenza vaccines, aspartame and phenylalanine, caffeine, and shellfish allergy.  MEDICATIONS:  Current Outpatient Medications  Medication Sig Dispense Refill   [START ON 10/02/2023] apixaban  (ELIQUIS ) 2.5 MG TABS tablet Take 1 tablet (2.5 mg total) by mouth 2 (two) times daily. 60 tablet 11   atenolol  (TENORMIN ) 50 MG tablet Take 50 mg by mouth daily.     triamterene -hydrochlorothiazide (DYAZIDE) 37.5-25 MG capsule Take 1 capsule by mouth daily.     No current facility-administered medications for this visit.     REVIEW OF SYSTEMS:    Review of Systems - Oncology  All other pertinent systems were reviewed with the patient and are negative.  PHYSICAL EXAMINATION:    Onc Performance Status - 09/14/23 1500       ECOG Perf Status   ECOG Perf Status Fully active, able to carry on all pre-disease performance without restriction      KPS SCALE   KPS % SCORE Normal, no compliants, no evidence of disease  Vitals:   09/14/23 1449 09/14/23 1450  BP: (!) 186/71 (!) 171/72  Pulse: 61   Resp: 18   Temp: 98.2 F (36.8 C)   SpO2: 100%    Filed Weights   09/14/23 1449  Weight: 155 lb 1.6 oz (70.4 kg)    Physical Exam Constitutional:      General: She is not in acute distress.    Appearance: Normal appearance.  HENT:     Head: Normocephalic and atraumatic.  Cardiovascular:     Rate and Rhythm: Normal rate.  Pulmonary:     Effort: Pulmonary effort is normal. No respiratory distress.  Abdominal:     General: There is no distension.  Neurological:     General: No focal deficit present.     Mental Status: She is alert and oriented to person, place, and time.   Psychiatric:        Mood and Affect: Mood normal.        Behavior: Behavior normal.     LABORATORY DATA:   I have reviewed the data as listed.  Results for orders placed or performed in visit on 09/14/23  Lactate dehydrogenase  Result Value Ref Range   LDH 187 98 - 192 U/L  CMP (Cancer Center only)  Result Value Ref Range   Sodium 138 135 - 145 mmol/L   Potassium 4.0 3.5 - 5.1 mmol/L   Chloride 100 98 - 111 mmol/L   CO2 25 22 - 32 mmol/L   Glucose, Bld 95 70 - 99 mg/dL   BUN 28 (H) 8 - 23 mg/dL   Creatinine 9.14 (H) 7.82 - 1.00 mg/dL   Calcium  10.0 8.9 - 10.3 mg/dL   Total Protein 8.3 (H) 6.5 - 8.1 g/dL   Albumin  4.4 3.5 - 5.0 g/dL   AST 36 15 - 41 U/L   ALT 10 0 - 44 U/L   Alkaline Phosphatase 98 38 - 126 U/L   Total Bilirubin 0.3 0.0 - 1.2 mg/dL   GFR, Estimated 26 (L) >60 mL/min   Anion gap 13 5 - 15  CBC with Differential (Cancer Center Only)  Result Value Ref Range   WBC Count 6.7 4.0 - 10.5 K/uL   RBC 3.93 3.87 - 5.11 MIL/uL   Hemoglobin 11.1 (L) 12.0 - 15.0 g/dL   HCT 95.6 (L) 21.3 - 08.6 %   MCV 87.5 80.0 - 100.0 fL   MCH 28.2 26.0 - 34.0 pg   MCHC 32.3 30.0 - 36.0 g/dL   RDW 57.8 46.9 - 62.9 %   Platelet Count 258 150 - 400 K/uL   nRBC 0.0 0.0 - 0.2 %   Neutrophils Relative % 45 %   Neutro Abs 3.0 1.7 - 7.7 K/uL   Lymphocytes Relative 43 %   Lymphs Abs 2.9 0.7 - 4.0 K/uL   Monocytes Relative 7 %   Monocytes Absolute 0.5 0.1 - 1.0 K/uL   Eosinophils Relative 4 %   Eosinophils Absolute 0.2 0.0 - 0.5 K/uL   Basophils Relative 1 %   Basophils Absolute 0.0 0.0 - 0.1 K/uL   Immature Granulocytes 0 %   Abs Immature Granulocytes 0.01 0.00 - 0.07 K/uL     RADIOGRAPHIC STUDIES:  No recent pertinent imaging studies available to review.  Orders Placed This Encounter  Procedures   CBC with Differential (Cancer Center Only)    Standing Status:   Future    Expected Date:   03/16/2024    Expiration Date:   09/13/2024  CMP (Cancer Center only)     Standing Status:   Future    Expected Date:   03/16/2024    Expiration Date:   09/13/2024   Iron and TIBC    Standing Status:   Future    Expected Date:   03/16/2024    Expiration Date:   09/13/2024   Ferritin    Standing Status:   Future    Expected Date:   03/16/2024    Expiration Date:   09/13/2024   Multiple Myeloma Panel (SPEP&IFE w/QIG)    Standing Status:   Future    Expected Date:   03/16/2024    Expiration Date:   09/13/2024   Kappa/lambda light chains    Standing Status:   Future    Expected Date:   03/16/2024    Expiration Date:   09/13/2024    This document was completed utilizing speech recognition software. Grammatical errors, random word insertions, pronoun errors, and incomplete sentences are an occasional consequence of this system due to software limitations, ambient noise, and hardware issues. Any formal questions or concerns about the content, text or information contained within the body of this dictation should be directly addressed to the provider for clarification.

## 2023-09-14 NOTE — Assessment & Plan Note (Signed)
 Chronic kidney disease with well-maintained kidney function. Creatinine level was 1.94 in March and is 1.92 today, after elevation to 2.24 during recent infection-related hospitalization. Other electrolytes and liver function tests are normal.

## 2023-09-15 ENCOUNTER — Telehealth: Payer: Self-pay | Admitting: Oncology

## 2023-09-15 LAB — KAPPA/LAMBDA LIGHT CHAINS
Kappa free light chain: 42.7 mg/L — ABNORMAL HIGH (ref 3.3–19.4)
Kappa, lambda light chain ratio: 1.26 (ref 0.26–1.65)
Lambda free light chains: 34 mg/L — ABNORMAL HIGH (ref 5.7–26.3)

## 2023-09-15 NOTE — Telephone Encounter (Signed)
 Patient has been scheduled for follow-up visit per 09/14/23 LOS.  Pt noted appt details on personal electronic device.

## 2023-09-16 LAB — MULTIPLE MYELOMA PANEL, SERUM
Albumin SerPl Elph-Mcnc: 4.1 g/dL (ref 2.9–4.4)
Albumin/Glob SerPl: 1.2 (ref 0.7–1.7)
Alpha 1: 0.2 g/dL (ref 0.0–0.4)
Alpha2 Glob SerPl Elph-Mcnc: 0.7 g/dL (ref 0.4–1.0)
B-Globulin SerPl Elph-Mcnc: 1 g/dL (ref 0.7–1.3)
Gamma Glob SerPl Elph-Mcnc: 1.6 g/dL (ref 0.4–1.8)
Globulin, Total: 3.5 g/dL (ref 2.2–3.9)
IgA: 190 mg/dL (ref 64–422)
IgG (Immunoglobin G), Serum: 1697 mg/dL — ABNORMAL HIGH (ref 586–1602)
IgM (Immunoglobulin M), Srm: 103 mg/dL (ref 26–217)
M Protein SerPl Elph-Mcnc: 0.8 g/dL — ABNORMAL HIGH
Total Protein ELP: 7.6 g/dL (ref 6.0–8.5)

## 2024-03-14 ENCOUNTER — Telehealth: Payer: Self-pay | Admitting: Oncology

## 2024-03-14 NOTE — Telephone Encounter (Signed)
 PT WILL NOT MAKE APPT NOT FEELING WELL.

## 2024-03-15 ENCOUNTER — Other Ambulatory Visit

## 2024-03-15 ENCOUNTER — Inpatient Hospital Stay: Admitting: Oncology
